# Patient Record
Sex: Male | Born: 1960 | Race: White | Hispanic: No | Smoking: Former smoker
Health system: Southern US, Community
[De-identification: ages and names within clinical notes are randomized; demographics above are authoritative.]

## PROBLEM LIST (undated history)

## (undated) DIAGNOSIS — E785 Hyperlipidemia, unspecified: Secondary | ICD-10-CM

## (undated) DIAGNOSIS — M1990 Unspecified osteoarthritis, unspecified site: Secondary | ICD-10-CM

## (undated) DIAGNOSIS — I10 Essential (primary) hypertension: Secondary | ICD-10-CM

## (undated) DIAGNOSIS — R7303 Prediabetes: Secondary | ICD-10-CM

## (undated) DIAGNOSIS — L309 Dermatitis, unspecified: Secondary | ICD-10-CM

## (undated) DIAGNOSIS — I872 Venous insufficiency (chronic) (peripheral): Secondary | ICD-10-CM

## (undated) HISTORY — PX: TYMPANOSTOMY TUBE PLACEMENT: SHX32

## (undated) HISTORY — PX: TONSILLECTOMY: SUR1361

## (undated) HISTORY — DX: Essential (primary) hypertension: I10

## (undated) HISTORY — DX: Prediabetes: R73.03

## (undated) HISTORY — DX: Unspecified osteoarthritis, unspecified site: M19.90

## (undated) HISTORY — PX: WISDOM TOOTH EXTRACTION: SHX21

## (undated) HISTORY — DX: Venous insufficiency (chronic) (peripheral): I87.2

## (undated) HISTORY — DX: Hyperlipidemia, unspecified: E78.5

## (undated) HISTORY — DX: Dermatitis, unspecified: L30.9

## (undated) HISTORY — PX: COLONOSCOPY: SHX174

---

## 2017-05-20 ENCOUNTER — Ambulatory Visit (INDEPENDENT_AMBULATORY_CARE_PROVIDER_SITE_OTHER): Payer: 59 | Admitting: Osteopathic Medicine

## 2017-05-20 ENCOUNTER — Encounter: Payer: Self-pay | Admitting: Osteopathic Medicine

## 2017-05-20 VITALS — BP 159/95 | HR 94 | Ht 74.5 in | Wt 307.0 lb

## 2017-05-20 DIAGNOSIS — Z860100 Personal history of colon polyps, unspecified: Secondary | ICD-10-CM

## 2017-05-20 DIAGNOSIS — Z23 Encounter for immunization: Secondary | ICD-10-CM

## 2017-05-20 DIAGNOSIS — Z1159 Encounter for screening for other viral diseases: Secondary | ICD-10-CM

## 2017-05-20 DIAGNOSIS — I10 Essential (primary) hypertension: Secondary | ICD-10-CM | POA: Diagnosis not present

## 2017-05-20 DIAGNOSIS — I872 Venous insufficiency (chronic) (peripheral): Secondary | ICD-10-CM

## 2017-05-20 DIAGNOSIS — Z6838 Body mass index (BMI) 38.0-38.9, adult: Secondary | ICD-10-CM | POA: Diagnosis not present

## 2017-05-20 DIAGNOSIS — L308 Other specified dermatitis: Secondary | ICD-10-CM | POA: Diagnosis not present

## 2017-05-20 LAB — COMPLETE METABOLIC PANEL WITH GFR
ALBUMIN: 4.2 g/dL (ref 3.6–5.1)
ALK PHOS: 80 U/L (ref 40–115)
ALT: 21 U/L (ref 9–46)
AST: 20 U/L (ref 10–35)
BUN: 12 mg/dL (ref 7–25)
CHLORIDE: 105 mmol/L (ref 98–110)
CO2: 24 mmol/L (ref 20–32)
CREATININE: 0.89 mg/dL (ref 0.70–1.33)
Calcium: 9.1 mg/dL (ref 8.6–10.3)
GFR, Est African American: >89 mL/min (ref >=60)
GFR, Est Non African American: >89 mL/min (ref >=60)
GLUCOSE: 127 mg/dL — AB (ref 65–99)
POTASSIUM: 3.8 mmol/L (ref 3.5–5.3)
SODIUM: 140 mmol/L (ref 135–146)
Total Bilirubin: 1 mg/dL (ref 0.2–1.2)
Total Protein: 6.8 g/dL (ref 6.1–8.1)

## 2017-05-20 LAB — LIPID PANEL
CHOL/HDL RATIO: 5.2 ratio — AB (ref <5.0)
Cholesterol: 193 mg/dL (ref <200)
HDL: 37 mg/dL — ABNORMAL LOW (ref >40)
LDL Cholesterol: 126 mg/dL — ABNORMAL HIGH (ref <100)
Triglycerides: 151 mg/dL — ABNORMAL HIGH (ref <150)
VLDL: 30 mg/dL (ref <30)

## 2017-05-20 LAB — CBC WITH DIFFERENTIAL/PLATELET
Basophils Absolute: 73 cells/uL (ref 0–200)
Basophils Relative: 1 %
EOS PCT: 2 %
Eosinophils Absolute: 146 cells/uL (ref 15–500)
HCT: 44.8 % (ref 38.5–50.0)
HEMOGLOBIN: 15.1 g/dL (ref 13.2–17.1)
LYMPHS ABS: 2044 {cells}/uL (ref 850–3900)
Lymphocytes Relative: 28 %
MCH: 31.1 pg (ref 27.0–33.0)
MCHC: 33.7 g/dL (ref 32.0–36.0)
MCV: 92.4 fL (ref 80.0–100.0)
MPV: 11.3 fL (ref 7.5–12.5)
Monocytes Absolute: 438 cells/uL (ref 200–950)
Monocytes Relative: 6 %
NEUTROS ABS: 4599 {cells}/uL (ref 1500–7800)
Neutrophils Relative %: 63 %
PLATELETS: 136 10*3/uL — AB (ref 140–400)
RBC: 4.85 MIL/uL (ref 4.20–5.80)
RDW: 13.4 % (ref 11.0–15.0)
WBC: 7.3 10*3/uL (ref 3.8–10.8)

## 2017-05-20 LAB — HEPATITIS C ANTIBODY: HCV Ab: NONREACTIVE

## 2017-05-20 MED ORDER — CLOBETASOL PROPIONATE 0.05 % EX OINT: 1.0000 "application " | TOPICAL_OINTMENT | Freq: Two times a day (BID) | CUTANEOUS | 3 refills | Status: DC

## 2017-05-20 NOTE — Patient Instructions (Addendum)
Plan: Eczema: steroid cream to small patches as needed, otherwise can continue Zyrtec daily with Cerave lotion.  Legs: compression stockings and walking are key! If getting worse, let me know.  Blood pressure: Whoa! We need to recheck this. I'm getting labs and as long as no issue on the blood work we should start a medicine for this.  Labs: screening for thyroid issues, diabetes, liver/kidney and other major organ functions, blood counts, vitamin D. Let's follow up soon to review lab work and recheck blood pressure

## 2017-05-20 NOTE — Progress Notes (Signed)
HPI: James Ware is a 56 y.o. male  who presents to [place] today, 05/20/17,  for chief complaint of:  Chief Complaint  Patient presents with  . Establish Care    Annual, referral for Colonoscopy, discuss problems with lower leg    Eczema/itching: Intermittent dry skin and itching, typically helped by Cerave lotion and when itching is bad Zyrtec is helpful.  Skin changes: Concern for petechiae on lower extremities. Has been getting a bit worse as he gets older. Occasional lower extremity edema which resolves with leg elevation. Compression socks were helpful but he is not consistent about wearing these.  Blood pressure: No history of hypertension but has been some time since he was seen by a doctor. Blood pressure elevated today, no chest pain pressure or shortness of breath. Positive family history of high blood pressure with mom     Past medical, surgical, social and family history reviewed: There are no active problems to display for this patient.  No past surgical history on file. Social History  Substance Use Topics  . Smoking status: Current Some Day Smoker  . Smokeless tobacco: Never Used  . Alcohol use Not on file   Family History  Problem Relation Age of Onset  . Hyperlipidemia Mother   . Cancer Father        bladder     Current medication list and allergy/intolerance information reviewed:   Current Outpatient Prescriptions  Medication Sig Dispense Refill  . Krill Oil 300 MG CAPS Take 300 mg by mouth daily.    . Multiple Vitamin (MULTIVITAMIN) tablet Take by mouth.     No current facility-administered medications for this visit.    No Known Allergies    Review of Systems:  Constitutional:  No  fever, no chills, No recent illness, No unintentional weight changes. No significant fatigue.   HEENT: No  headache, no vision change, no hearing change, No sore throat, No  sinus pressure  Cardiac: No  chest pain, No   pressure, No palpitations  Respiratory:  No  shortness of breath. No  Cough  Gastrointestinal: No  abdominal pain, No  nausea, No  vomiting,  No  blood in stool, No  diarrhea, No  constipation    Musculoskeletal: No new myalgia/arthralgia  Genitourinary: No  incontinence, No  abnormal genital bleeding, No abnormal genital discharge  Skin: +Rash, No other wounds/concerning lesions  Hem/Onc: No  easy bruising/bleeding  Endocrine: No cold intolerance,  No heat intolerance.  Neurologic: No  weakness, No  dizziness  Psychiatric: No  concerns with depression, No  concerns with anxiety, No sleep problems, No mood problems  Exam:  BP (!) 159/95   Pulse 94   Ht 6' 2.5" (1.892 m)   Wt (!) 307 lb (139.3 kg)   BMI 38.89 kg/m   Constitutional: VS see above. General Appearance: alert, well-developed, well-nourished, NAD  Eyes: Normal lids and conjunctive, non-icteric sclera  Ears, Nose, Mouth, Throat: MMM, Normal external inspection ears/nares/mouth/lips/gums.   Neck: No masses, trachea midline. No thyroid enlargement. No tenderness/mass appreciated. No lymphadenopathy  Respiratory: Normal respiratory effort. no wheeze, no rhonchi, no rales  Cardiovascular: S1/S2 normal, no murmur, no rub/gallop auscultated. RRR. Trace lower extremity edema at ankles - sock line, nonpitting. Pedal pulse II/IV bilaterally DP and PT.  Gastrointestinal: Obese  Musculoskeletal: Gait normal.   Neurological: Normal balance/coordination. No tremor.   Skin: warm, dry, intact. No significant petechiae but (+)enous stasis dermatitis mild without ulceration. Eczema patch on  right shoulder blade region. Mild excoriation and scaling without ulceration/drainage, no significant erythema   Psychiatric: Normal judgment/insight. Normal mood and affect. Oriented x3.     ASSESSMENT/PLAN:   Venous stasis dermatitis of both lower extremities - advised compression stockings, walking breaks at work, weight loss  -  Plan: TSH  Other eczema - Hypodensity steroid treatment at areas of rash/dermatitis, otherwise continue lotion and consider daily antihistamine - Plan: CBC with Differential/Platelet, clobetasol ointment (TEMOVATE) 0.05 %  Essential hypertension - If labs are okay, consider diuretics given mild swelling issues - Plan: COMPLETE METABOLIC PANEL WITH GFR, Lipid panel, TSH, Hemoglobin A1c, CBC with Differential/Platelet  History of colon polyps - Needs referral to previous GI, he will contact us with this information so we can place referral to whoever he saw before  BMI 38.0-38.9,adult - Plan: Hemoglobin A1c, VITAMIN D 25 Hydroxy (Vit-D Deficiency, Fractures)  Need for immunization against influenza - Plan: Flu Vaccine QUAD 36+ mos IM  Need for hepatitis C screening test - Plan: Hepatitis C antibody    Patient Instructions  Plan: Eczema: steroid cream to small patches as needed, otherwise can continue Zyrtec daily with Cerave lotion.  Legs: compression stockings and walking are key! If getting worse, let me know.  Blood pressure: Whoa! We need to recheck this. I'm getting labs and as long as no issue on the blood work we should start a medicine for this.  Labs: screening for thyroid issues, diabetes, liver/kidney and other major organ functions, blood counts, vitamin D. Let's follow up soon to review lab work and recheck blood pressure    Visit summary with medication list and pertinent instructions was printed for patient to review. All questions at time of visit were answered - patient instructed to contact office with any additional concerns. ER/RTC precautions were reviewed with the patient. Follow-up plan: No Follow-up on file.  Note: Total time spent 45 minutes, greater than 50% of the visit was spent face-to-face counseling and coordinating care for the following: The primary encounter diagnosis was Venous stasis dermatitis of both lower extremities. Diagnoses of Other eczema, Essential  hypertension, History of colon polyps, BMI 38.0-38.9,adult, Need for immunization against influenza, and Need for hepatitis C screening test were also pertinent to this visit.Marland Kitchen

## 2017-05-21 LAB — HEMOGLOBIN A1C
Hgb A1c MFr Bld: 6.4 % — ABNORMAL HIGH (ref <5.7)
MEAN PLASMA GLUCOSE: 137 mg/dL

## 2017-05-21 LAB — TSH: TSH: 2 m[IU]/L (ref 0.40–4.50)

## 2017-05-21 LAB — VITAMIN D 25 HYDROXY (VIT D DEFICIENCY, FRACTURES): Vit D, 25-Hydroxy: 18 ng/mL — ABNORMAL LOW (ref 30–100)

## 2017-05-24 ENCOUNTER — Telehealth: Payer: Self-pay | Admitting: Osteopathic Medicine

## 2017-05-24 ENCOUNTER — Encounter: Payer: Self-pay | Admitting: Osteopathic Medicine

## 2017-05-24 DIAGNOSIS — R7303 Prediabetes: Secondary | ICD-10-CM

## 2017-05-24 DIAGNOSIS — E1159 Type 2 diabetes mellitus with other circulatory complications: Secondary | ICD-10-CM | POA: Insufficient documentation

## 2017-05-24 DIAGNOSIS — L309 Dermatitis, unspecified: Secondary | ICD-10-CM | POA: Insufficient documentation

## 2017-05-24 DIAGNOSIS — I10 Essential (primary) hypertension: Secondary | ICD-10-CM

## 2017-05-24 DIAGNOSIS — I872 Venous insufficiency (chronic) (peripheral): Secondary | ICD-10-CM

## 2017-05-24 DIAGNOSIS — E1169 Type 2 diabetes mellitus with other specified complication: Secondary | ICD-10-CM | POA: Insufficient documentation

## 2017-05-24 HISTORY — DX: Essential (primary) hypertension: I10

## 2017-05-24 HISTORY — DX: Prediabetes: R73.03

## 2017-05-24 HISTORY — DX: Venous insufficiency (chronic) (peripheral): I87.2

## 2017-05-24 HISTORY — DX: Dermatitis, unspecified: L30.9

## 2017-05-24 MED ORDER — HYDROCHLOROTHIAZIDE 25 MG PO TABS: 25.0000 mg | ORAL_TABLET | Freq: Every day | ORAL | 2 refills | Status: DC

## 2017-05-24 NOTE — Telephone Encounter (Signed)
Please see below, a son colonoscopy request.  Please call patient: We can have him sign a records release when she is in the office next time for previous family medicine records. In the meantime, he should have gotten a call about his lab results. I went ahead and sent in a blood pressure medication for him to go ahead and start, I forgot to send it on Friday. My apologies! Any questions, let me know or we can discuss at upcoming visit

## 2017-05-24 NOTE — Telephone Encounter (Signed)
Pt sent this message through [facility] scheduling: Dr. Sheppard Coil -  In 2011 my colonoscopy was done at [facility]. They have organized now into a [place], [phone] - they said fax referral order to [phone] for Dr. Aviva Signs, the same physician who did the procedure 7 years (though it was eight,so not as tardy as I thought). They are covered by my Dentist.  BTW contact information for earlier doctors re medical records history - from early 1990s until 2012 went to Alonna Buckler at [facility] in [place], [place], [place]. Ph [phone].  From 2013-2016 saw Neva Seat at [facility], [place] , [place]. [place] Ph [phone]  Thanks for your words yesterday.  See you next Thursday!  Theodis Shove

## 2017-05-25 NOTE — Telephone Encounter (Signed)
Spoke to patient gave him advise as noted below. Tahmid Stonehocker,CMA  

## 2017-05-27 ENCOUNTER — Ambulatory Visit (INDEPENDENT_AMBULATORY_CARE_PROVIDER_SITE_OTHER): Payer: 59 | Admitting: Osteopathic Medicine

## 2017-05-27 ENCOUNTER — Encounter: Payer: Self-pay | Admitting: Osteopathic Medicine

## 2017-05-27 VITALS — BP 140/90 | HR 92 | Ht 75.5 in | Wt 305.0 lb

## 2017-05-27 DIAGNOSIS — E782 Mixed hyperlipidemia: Secondary | ICD-10-CM | POA: Insufficient documentation

## 2017-05-27 DIAGNOSIS — R7303 Prediabetes: Secondary | ICD-10-CM

## 2017-05-27 DIAGNOSIS — D696 Thrombocytopenia, unspecified: Secondary | ICD-10-CM | POA: Diagnosis not present

## 2017-05-27 DIAGNOSIS — I10 Essential (primary) hypertension: Secondary | ICD-10-CM

## 2017-05-27 DIAGNOSIS — E559 Vitamin D deficiency, unspecified: Secondary | ICD-10-CM | POA: Diagnosis not present

## 2017-05-27 DIAGNOSIS — E785 Hyperlipidemia, unspecified: Secondary | ICD-10-CM | POA: Insufficient documentation

## 2017-05-27 MED ORDER — VITAMIN D (ERGOCALCIFEROL) 1.25 MG (50000 UNIT) PO CAPS: 50000.0000 [IU] | ORAL_CAPSULE | ORAL | 0 refills | Status: DC

## 2017-05-27 NOTE — Patient Instructions (Addendum)
ASCVD risk - currently 10.5% for 10 year risk of heart attack and stroke, let's reevaluate in 3 months   In 3 months, let's recheck cholesterol, A1C (sugars), and vitamin D - and see how we are doing!   Blood pressure - nurse visit check in 2 weeks and labs that same day for rechecking kidney

## 2017-05-27 NOTE — Progress Notes (Signed)
HPI: James Ware is a 56 y.o. male  who presents to [place] today, 05/27/17,  for chief complaint of:  Chief Complaint  Patient presents with  . Follow-up    LABS    Seen last week to est care, we are following up today for review labs and recheck BP after starting medications.   Has been on HCT x2 days, no problems. No dizziness no CP.   Labs show: Cholesterol not too bad but ASCVD risk >7.5% Prediabetes, almost in DM2 range.  Vit D deficiency Healthy liver and kidney function Slightly low platelets   Past medical, surgical, social and family history reviewed: Patient Active Problem List   Diagnosis Date Noted  . Essential hypertension 05/24/2017  . Eczema 05/24/2017  . Venous stasis dermatitis of both lower extremities 05/24/2017  . Prediabetes 05/24/2017   No past surgical history on file. Social History  Substance Use Topics  . Smoking status: Current Some Day Smoker  . Smokeless tobacco: Never Used  . Alcohol use Not on file   Family History  Problem Relation Age of Onset  . Hyperlipidemia Mother   . Cancer Father        bladder     Current medication list and allergy/intolerance information reviewed:   Current Outpatient Prescriptions  Medication Sig Dispense Refill  . cetirizine (ZYRTEC) 10 MG tablet Take 10 mg by mouth daily.    . clobetasol ointment (TEMOVATE) 4.08 % Apply 1 application topically 2 (two) times daily. To affected area(s) as needed, max 2 weeks to avoid whitening/thinning skin 30 g 3  . hydrochlorothiazide (HYDRODIURIL) 25 MG tablet Take 1 tablet (25 mg total) by mouth daily. 30 tablet 2  . Krill Oil 300 MG CAPS Take 300 mg by mouth daily.    . Multiple Vitamin (MULTIVITAMIN) tablet Take by mouth.     No current facility-administered medications for this visit.    No Known Allergies    Review of Systems:  Constitutional: No recent illness, feels well today   HEENT: No  headache, no vision  change  Cardiac: No  chest pain, No  pressure, No palpitations  Respiratory:  No  shortness of breath. No  Cough  Neurologic: No  weakness, No  dizziness  Psychiatric: No  concerns with depression, No  concerns with anxiety  Exam:  BP 140/90   Pulse 92   Ht 6' 3.5" (1.918 m)   Wt (!) 305 lb (138.3 kg)   BMI 37.62 kg/m   Constitutional: VS see above. General Appearance: alert, well-developed, well-nourished, NAD  Eyes: Normal lids and conjunctive, non-icteric sclera  Ears, Nose, Mouth, Throat: MMM, Normal external inspection ears/nares/mouth/lips/gums.   Neck: No masses, trachea midline.  Respiratory: Normal respiratory effort. no wheeze, no rhonchi, no rales  Cardiovascular: S1/S2 normal, no murmur, no rub/gallop auscultated. RRR.   Musculoskeletal: Gait normal.  Neurological: Normal balance/coordination. No tremor.  Psychiatric: Normal judgment/insight. Normal mood and affect. Oriented x3.     ASSESSMENT/PLAN:   Essential hypertension - Plan: BASIC METABOLIC PANEL WITH GFR  Prediabetes - Plan: Hemoglobin A1c  Vitamin D deficiency - Plan: VITAMIN D 25 Hydroxy (Vit-D Deficiency, Fractures)  Mixed hyperlipidemia - Plan: Lipid panel  Decreased platelet count (HCC)    Patient Instructions  ASCVD risk - currently 10.5% for 10 year risk of heart attack and stroke, let's reevaluate in 3 months   In 3 months, let's recheck cholesterol, A1C (sugars), and vitamin D - and see how we are  doing!   Blood pressure - nurse visit check in 2 weeks and labs that same day for rechecking kidney     Visit summary with medication list and pertinent instructions was printed for patient to review. All questions at time of visit were answered - patient instructed to contact office with any additional concerns. ER/RTC precautions were reviewed with the patient. Follow-up plan: Return in about 3 months (around 08/27/2017) for nurse visit BP check 2 weeks, and check with Dr. Loni Muse w/  review labs in 3 months .  Note: Total time spent 25 minutes, greater than 50% of the visit was spent face-to-face counseling and coordinating care for the following: The primary encounter diagnosis was Essential hypertension. Diagnoses of Prediabetes, Vitamin D deficiency, Mixed hyperlipidemia, and Decreased platelet count ([place]) were also pertinent to this visit.Marland Kitchen

## 2017-06-10 ENCOUNTER — Ambulatory Visit (INDEPENDENT_AMBULATORY_CARE_PROVIDER_SITE_OTHER): Payer: 59 | Admitting: Osteopathic Medicine

## 2017-06-10 ENCOUNTER — Ambulatory Visit: Payer: 59

## 2017-06-10 ENCOUNTER — Other Ambulatory Visit: Payer: Self-pay

## 2017-06-10 VITALS — BP 137/76 | HR 82

## 2017-06-10 DIAGNOSIS — I10 Essential (primary) hypertension: Secondary | ICD-10-CM | POA: Diagnosis not present

## 2017-06-10 DIAGNOSIS — E782 Mixed hyperlipidemia: Secondary | ICD-10-CM

## 2017-06-10 DIAGNOSIS — Z1211 Encounter for screening for malignant neoplasm of colon: Secondary | ICD-10-CM

## 2017-06-10 DIAGNOSIS — R7303 Prediabetes: Secondary | ICD-10-CM

## 2017-06-10 DIAGNOSIS — E559 Vitamin D deficiency, unspecified: Secondary | ICD-10-CM

## 2017-06-10 MED ORDER — HYDROCHLOROTHIAZIDE 25 MG PO TABS: 25.0000 mg | ORAL_TABLET | Freq: Every day | ORAL | 1 refills | Status: DC

## 2017-06-10 NOTE — Progress Notes (Signed)
BP 137/76   Pulse 82  Continue current meds! Refills sent

## 2017-06-10 NOTE — Progress Notes (Signed)
Pt came into clinic today for BP check. Pt reports taking his medications daily, no negative side effects. Pt's BP was at goal today in office. Pt did state he is ready to get his colonoscopy completed, order placed. Pt also is going to schedule an eye exam, he will have them fax over report after completion. No further questions or concerns. Pt will get labs completed today.

## 2017-06-11 ENCOUNTER — Encounter: Payer: Self-pay | Admitting: Osteopathic Medicine

## 2017-06-11 LAB — LIPID PANEL W/REFLEX DIRECT LDL
CHOL/HDL RATIO: 5.3 (calc) — AB (ref <5.0)
Cholesterol: 175 mg/dL (ref <200)
HDL: 33 mg/dL — ABNORMAL LOW (ref >40)
LDL CHOLESTEROL (CALC): 117 mg/dL — AB
Non-HDL Cholesterol (Calc): 142 mg/dL (calc) — ABNORMAL HIGH (ref <130)
TRIGLYCERIDES: 141 mg/dL (ref <150)

## 2017-06-11 LAB — CBC WITH DIFFERENTIAL/PLATELET
Basophils Absolute: 50 cells/uL (ref 0–200)
Basophils Relative: 0.7 %
Eosinophils Absolute: 192 cells/uL (ref 15–500)
Eosinophils Relative: 2.7 %
HCT: 41.9 % (ref 38.5–50.0)
Hemoglobin: 14.4 g/dL (ref 13.2–17.1)
Lymphs Abs: 2215 cells/uL (ref 850–3900)
MCH: 31.2 pg (ref 27.0–33.0)
MCHC: 34.4 g/dL (ref 32.0–36.0)
MCV: 90.7 fL (ref 80.0–100.0)
MPV: 11.3 fL (ref 7.5–12.5)
Monocytes Relative: 8.5 %
NEUTROS PCT: 56.9 %
Neutro Abs: 4040 cells/uL (ref 1500–7800)
PLATELETS: 154 10*3/uL (ref 140–400)
RBC: 4.62 10*6/uL (ref 4.20–5.80)
RDW: 12.7 % (ref 11.0–15.0)
TOTAL LYMPHOCYTE: 31.2 %
WBC: 7.1 10*3/uL (ref 3.8–10.8)
WBCMIX: 604 {cells}/uL (ref 200–950)

## 2017-06-11 LAB — BASIC METABOLIC PANEL WITH GFR
BUN: 13 mg/dL (ref 7–25)
CHLORIDE: 103 mmol/L (ref 98–110)
CO2: 29 mmol/L (ref 20–32)
CREATININE: 0.98 mg/dL (ref 0.70–1.33)
Calcium: 9.2 mg/dL (ref 8.6–10.3)
GFR, Est African American: 98 mL/min/{1.73_m2} (ref >=60)
GFR, Est Non African American: 85 mL/min/{1.73_m2} (ref >=60)
GLUCOSE: 114 mg/dL — AB (ref 65–99)
Potassium: 3.9 mmol/L (ref 3.5–5.3)
SODIUM: 140 mmol/L (ref 135–146)

## 2017-06-11 LAB — HEMOGLOBIN A1C
Hgb A1c MFr Bld: 6.1 % of total Hgb — ABNORMAL HIGH (ref <5.7)
MEAN PLASMA GLUCOSE: 128 (calc)
eAG (mmol/L): 7.1 (calc)

## 2017-06-11 LAB — VITAMIN D 25 HYDROXY (VIT D DEFICIENCY, FRACTURES): Vit D, 25-Hydroxy: 31 ng/mL (ref 30–100)

## 2017-07-30 LAB — HM COLONOSCOPY

## 2017-08-26 ENCOUNTER — Ambulatory Visit: Payer: 59 | Admitting: Osteopathic Medicine

## 2017-08-28 LAB — LIPID PANEL
CHOL/HDL RATIO: 4.1 (calc) (ref <5.0)
Cholesterol: 111 mg/dL (ref <200)
HDL: 27 mg/dL — AB (ref >40)
LDL Cholesterol (Calc): 66 mg/dL (calc)
NON-HDL CHOLESTEROL (CALC): 84 mg/dL (ref <130)
Triglycerides: 98 mg/dL (ref <150)

## 2017-08-28 LAB — VITAMIN D 25 HYDROXY (VIT D DEFICIENCY, FRACTURES): Vit D, 25-Hydroxy: 49 ng/mL (ref 30–100)

## 2017-08-28 LAB — HEMOGLOBIN A1C
EAG (MMOL/L): 7.6 (calc)
HEMOGLOBIN A1C: 6.4 %{Hb} — AB (ref <5.7)
MEAN PLASMA GLUCOSE: 137 (calc)

## 2017-09-02 ENCOUNTER — Encounter: Payer: Self-pay | Admitting: Osteopathic Medicine

## 2017-09-02 ENCOUNTER — Ambulatory Visit (INDEPENDENT_AMBULATORY_CARE_PROVIDER_SITE_OTHER): Payer: 59 | Admitting: Osteopathic Medicine

## 2017-09-02 VITALS — BP 118/79 | HR 84 | Wt 298.0 lb

## 2017-09-02 DIAGNOSIS — I10 Essential (primary) hypertension: Secondary | ICD-10-CM

## 2017-09-02 DIAGNOSIS — E559 Vitamin D deficiency, unspecified: Secondary | ICD-10-CM

## 2017-09-02 DIAGNOSIS — E782 Mixed hyperlipidemia: Secondary | ICD-10-CM | POA: Diagnosis not present

## 2017-09-02 DIAGNOSIS — R7303 Prediabetes: Secondary | ICD-10-CM | POA: Diagnosis not present

## 2017-09-02 LAB — POCT URINALYSIS DIPSTICK
BILIRUBIN UA: NEGATIVE
Blood, UA: NEGATIVE
Glucose, UA: NEGATIVE
KETONES UA: NEGATIVE
Leukocytes, UA: NEGATIVE
Nitrite, UA: NEGATIVE
Protein, UA: NEGATIVE
Spec Grav, UA: >=1.03 — AB (ref 1.010–1.025)
Urobilinogen, UA: 0.2 E.U./dL
pH, UA: 5.5 (ref 5.0–8.0)

## 2017-09-02 MED ORDER — TADALAFIL 10 MG PO TABS: 10.0000 mg | ORAL_TABLET | Freq: Every day | ORAL | 1 refills | Status: DC | PRN

## 2017-09-02 NOTE — Progress Notes (Signed)
HPI: James Ware is a 56 y.o. male who  has a past medical history of Eczema (05/24/2017), Essential hypertension (05/24/2017), Prediabetes (05/24/2017), and Venous stasis dermatitis of both lower extremities (05/24/2017).  he presents to [facility] today, 09/02/17,  for chief complaint of:  Chief Complaint  Patient presents with  . Follow-up    Labs    Hyperlipidemia: LDL trending down, HDL is still a bit on the low side. He has made significant adjustments to diet, some to exercise.  Prediabetes: See above, lifestyle changes have not made much difference in terms of A1c but initially he is still in the prediabetic range. Not interested in starting medications at this time.  Vitamin D deficiency: Up into normal range, has completed course of high-dose supplementation.  Hypertension: No chest pain, pressure, shortness of breath. No home blood pressures to report.    Past medical, surgical, social and family history reviewed:  Patient Active Problem List   Diagnosis Date Noted  . Mixed hyperlipidemia 05/27/2017  . Decreased platelet count ([place]) 05/27/2017  . Vitamin D deficiency 05/27/2017  . Essential hypertension 05/24/2017  . Eczema 05/24/2017  . Venous stasis dermatitis of both lower extremities 05/24/2017  . Prediabetes 05/24/2017      Social History   Tobacco Use  . Smoking status: Current Some Day Smoker  . Smokeless tobacco: Never Used  Substance Use Topics  . Alcohol use: Not on file    Family History  Problem Relation Age of Onset  . Hyperlipidemia Mother   . Cancer Father        bladder     Current medication list and allergy/intolerance information reviewed:    Current Outpatient Medications  Medication Sig Dispense Refill  . cetirizine (ZYRTEC) 10 MG tablet Take 10 mg by mouth daily.    . clobetasol ointment (TEMOVATE) 5.63 % Apply 1 application topically 2 (two) times daily. To affected area(s) as needed, max 2  weeks to avoid whitening/thinning skin 30 g 3  . hydrochlorothiazide (HYDRODIURIL) 25 MG tablet Take 1 tablet (25 mg total) by mouth daily. 90 tablet 1  . Krill Oil 300 MG CAPS Take 300 mg by mouth daily.    . Multiple Vitamin (MULTIVITAMIN) tablet Take by mouth.    . Vitamin D, Ergocalciferol, (DRISDOL) 50000 units CAPS capsule Take 1 capsule (50,000 Units total) by mouth every 7 (seven) days. Take for 12 total doses(weeks) 12 capsule 0   No current facility-administered medications for this visit.     No Known Allergies    Review of Systems:  Constitutional:  No  fever, no chills, No recent illness,  HEENT: No  headache, no vision change  Cardiac: No  chest pain, No  pressure  Respiratory:  No  shortness of breath. No  Cough  Gastrointestinal: No  abdominal pain, No  nausea  Musculoskeletal: No new myalgia/arthralgia  Skin: No  Rash  Psychiatric: No  concerns with depression, No  concerns with anxiety  Exam:  BP 118/79   Pulse 84   Wt 298 lb (135.2 kg)   SpO2 99%   BMI 36.76 kg/m   Constitutional: VS see above. General Appearance: alert, well-developed, well-nourished, NAD  Eyes: Normal lids and conjunctive, non-icteric sclera  Ears, Nose, Mouth, Throat: MMM, Normal external inspection ears/nares/mouth/lips/gums.   Neck: No masses, trachea midline.   Respiratory: Normal respiratory effort. no wheeze, no rhonchi, no rales  Cardiovascular: S1/S2 normal, no murmur, no rub/gallop auscultated. RRR.  Musculoskeletal: Gait normal.  Neurological: Normal balance/coordination. No tremor.  Skin: warm, dry, intact. No rash/ulcer.   Psychiatric: Normal judgment/insight. Normal mood and affect. Oriented x3.   Results for orders placed or performed in visit on 09/02/17 (from the past 24 hour(s))  POCT Urinalysis Dipstick     Status: Abnormal   Collection Time: 09/02/17  7:59 AM  Result Value Ref Range   Color, UA yellow    Clarity, UA clear    Glucose, UA negative     Bilirubin, UA negative    Ketones, UA negative    Spec Grav, UA >=1.030 (A) 1.010 - 1.025   Blood, UA negative    pH, UA 5.5 5.0 - 8.0   Protein, UA negative    Urobilinogen, UA 0.2 0.2 or 1.0 E.U./dL   Nitrite, UA negative    Leukocytes, UA Negative Negative      ASSESSMENT/PLAN:   Essential hypertension - Requests urinalysis, no complaints. Blood pressure better on recheck - Plan: POCT Urinalysis Dipstick  Prediabetes - Continue diet/exercise modifications, would strongly consider metformin  Vitamin D deficiency - Over-the-counter supplementation with 1000 - 2000 units daily  Mixed hyperlipidemia - Improved, HDL still a bit low. We'll monitor     Visit summary with medication list and pertinent instructions was printed for patient to review. All questions at time of visit were answered - patient instructed to contact office with any additional concerns. ER/RTC precautions were reviewed with the patient. Follow-up plan: Return in about 3 months (around 12/01/2017) for recheck prediabetes, sooner if needed.  Note: Total time spent 25 minutes, greater than 50% of the visit was spent face-to-face counseling and coordinating care for the following: The primary encounter diagnosis was Essential hypertension. Diagnoses of Prediabetes, Vitamin D deficiency, and Mixed hyperlipidemia were also pertinent to this visit.Marland Kitchen  Please note: voice recognition software was used to produce this document, and typos may escape review. Please contact Dr. Sheppard Coil for any needed clarifications.

## 2017-09-03 ENCOUNTER — Telehealth: Payer: Self-pay | Admitting: Osteopathic Medicine

## 2017-09-03 MED ORDER — SILDENAFIL CITRATE 50 MG PO TABS: 50.0000 mg | ORAL_TABLET | Freq: Every day | ORAL | 0 refills | Status: DC | PRN

## 2017-09-03 NOTE — Telephone Encounter (Signed)
Please call patient: I got a notification from [facility] that Cialis was not covered by his plan, only Viagra is. I sent in the Viagra. We'll canceled the Cialis. Any questions, concerns, let us know

## 2017-09-07 NOTE — Telephone Encounter (Signed)
Left message advising of new medication was sent to the pharmacy. I didn't leave the name of the medication. Also, I advised him to call back if he has any question.

## 2017-09-07 NOTE — Telephone Encounter (Signed)
Got notification that Viagra was also denied, will fax back asking what if any PDE5 inhibitors are covered.

## 2017-09-09 NOTE — Telephone Encounter (Signed)
Left VM for Pt to return clinic call.  

## 2017-09-11 ENCOUNTER — Other Ambulatory Visit: Payer: Self-pay | Admitting: Osteopathic Medicine

## 2017-09-14 MED ORDER — SILDENAFIL CITRATE 20 MG PO TABS: 20.0000 mg | ORAL_TABLET | ORAL | 6 refills | Status: DC | PRN

## 2017-09-14 NOTE — Telephone Encounter (Signed)
Patient called requesting an update. Routing to pcp for review.

## 2017-09-14 NOTE — Telephone Encounter (Signed)
LM for pt to return call. KG LPN

## 2017-09-14 NOTE — Telephone Encounter (Signed)
Can let patient know that the Cialis and Viagra were both denied by insurance.  I asked the pharmacy to send me a list of what insurance might cover for him, but I have not heard anything back yet.  If he is willing to pay out of pocket, typically the cheapest is Viagra through [facility] which does deliver to the home, this is a pharmacy in [place].  I will go ahead and send a prescription over there and someone from that pharmacy should contact him.

## 2017-09-14 NOTE — Addendum Note (Signed)
Addended by: Maryla Morrow on: 09/14/2017 12:28 PM   Modules accepted: Orders

## 2017-09-15 NOTE — Telephone Encounter (Signed)
Patient has been updated. Pick up Revatio prescription from [facility] today.

## 2017-12-02 ENCOUNTER — Ambulatory Visit: Payer: 59 | Admitting: Osteopathic Medicine

## 2017-12-13 ENCOUNTER — Encounter: Payer: Self-pay | Admitting: Osteopathic Medicine

## 2017-12-16 ENCOUNTER — Ambulatory Visit (INDEPENDENT_AMBULATORY_CARE_PROVIDER_SITE_OTHER): Payer: 59 | Admitting: Osteopathic Medicine

## 2017-12-16 ENCOUNTER — Encounter: Payer: Self-pay | Admitting: Osteopathic Medicine

## 2017-12-16 VITALS — BP 138/83 | HR 85 | Temp 97.7°F | Wt 300.1 lb

## 2017-12-16 DIAGNOSIS — R7303 Prediabetes: Secondary | ICD-10-CM | POA: Diagnosis not present

## 2017-12-16 DIAGNOSIS — H9193 Unspecified hearing loss, bilateral: Secondary | ICD-10-CM

## 2017-12-16 DIAGNOSIS — I10 Essential (primary) hypertension: Secondary | ICD-10-CM | POA: Diagnosis not present

## 2017-12-16 DIAGNOSIS — H9190 Unspecified hearing loss, unspecified ear: Secondary | ICD-10-CM | POA: Insufficient documentation

## 2017-12-16 LAB — POCT GLYCOSYLATED HEMOGLOBIN (HGB A1C): HEMOGLOBIN A1C: 7.4

## 2017-12-16 NOTE — Progress Notes (Signed)
HPI: James Ware is a 57 y.o. male who  has a past medical history of Eczema (05/24/2017), Essential hypertension (05/24/2017), Prediabetes (05/24/2017), and Venous stasis dermatitis of both lower extremities (05/24/2017).  he presents to [facility] today, 12/16/17,  for chief complaint of:  Sugar follow-up   Prediabetes: 08/27/17 A1C was 6.4% Today 12/16/17: 7.4% Admits he could be better about diet/exercise. Still reluctant to start medication treatment at this time.  Hypertension: Blood pressure under pre-good control today. No chest pain, pressure, shortness of breath.  Hearing loss: Patient will be getting hearing aids, following with audiology/ENT.    Past medical history, surgical history, social history and family history reviewed.  Current medication list and allergy/intolerance information reviewed.    Current Outpatient Medications on File Prior to Visit  Medication Sig Dispense Refill  . cetirizine (ZYRTEC) 10 MG tablet Take 10 mg by mouth daily.    . clobetasol ointment (TEMOVATE) 0.27 % Apply 1 application topically 2 (two) times daily. To affected area(s) as needed, max 2 weeks to avoid whitening/thinning skin 30 g 3  . hydrochlorothiazide (HYDRODIURIL) 25 MG tablet Take 1 tablet (25 mg total) by mouth daily. 90 tablet 1  . Krill Oil 300 MG CAPS Take 300 mg by mouth daily.    . Multiple Vitamin (MULTIVITAMIN) tablet Take by mouth.    . sildenafil (REVATIO) 20 MG tablet Take 1-5 tablets (20-100 mg total) by mouth as needed (prior to sex). 50 tablet 6   No current facility-administered medications on file prior to visit.    No Known Allergies    Review of Systems:  Constitutional: No recent illness  HEENT: No  headache, no vision change  Cardiac: No  chest pain, No  pressure, No palpitations  Respiratory:  No  shortness of breath. No  Cough  Gastrointestinal: No  abdominal pain, no change on bowel  habits  Musculoskeletal: No new myalgia/arthralgia  Skin: No  Rash  Hem/Onc: No  easy bruising/bleeding, No  abnormal lumps/bumps  Neurologic: No  weakness, No  Dizziness  Psychiatric: No  concerns with depression, No  concerns with anxiety  Exam:  BP 138/83 (BP Location: Left Arm)   Pulse 85   Temp 97.7 F (36.5 C) (Oral)   Wt (!) 300 lb 1.9 oz (136.1 kg)   BMI 37.02 kg/m   Constitutional: VS see above. General Appearance: alert, well-developed, well-nourished, NAD  Eyes: Normal lids and conjunctive, non-icteric sclera  Ears, Nose, Mouth, Throat: MMM, Normal external inspection ears/nares/mouth/lips/gums.  Neck: No masses, trachea midline.   Respiratory: Normal respiratory effort. no wheeze, no rhonchi, no rales  Cardiovascular: S1/S2 normal, no murmur, no rub/gallop auscultated. RRR.   Musculoskeletal: Gait normal. Symmetric and independent movement of all extremities  Neurological: Normal balance/coordination. No tremor.  Skin: warm, dry, intact.   Psychiatric: Normal judgment/insight. Normal mood and affect. Oriented x3.     ASSESSMENT/PLAN:   Prediabetes - A1c into the diabetic range, patient would like to give it 3 more months lifestyle changes before metformin considered.Okay for now - Plan: POCT HgB A1C  Bilateral hearing loss, unspecified hearing loss type - normal workup per patient, negative for Mnire's disease, syphilis, etc.  Essential hypertension        Follow-up plan: Return in about 3 months (around 03/18/2018) for recheck A1C .  Visit summary with medication list and pertinent instructions was printed for patient to review, alert [place] if any changes needed. All questions at time of visit were answered -  patient instructed to contact office with any additional concerns. ER/RTC precautions were reviewed with the patient and understanding verbalized.   Note: Total time spent 25 minutes, greater than 50% of the visit was spent face-to-face  counseling and coordinating care for the following: The primary encounter diagnosis was Prediabetes. Diagnoses of Bilateral hearing loss, unspecified hearing loss type and Essential hypertension were also pertinent to this visit.Marland Kitchen  Please note: voice recognition software was used to produce this document, and typos may escape review. Please contact Dr. Sheppard Coil for any needed clarifications.

## 2017-12-16 NOTE — Patient Instructions (Signed)
Diabetes Mellitus and Nutrition When you have diabetes (diabetes mellitus), it is very important to have healthy eating habits because your blood sugar (glucose) levels are greatly affected by what you eat and drink. Eating healthy foods in the appropriate amounts, at about the same times every day, can help you:  Control your blood glucose.  Lower your risk of heart disease.  Improve your blood pressure.  Reach or maintain a healthy weight.  Every person with diabetes is different, and each person has different needs for a meal plan. Your health care provider may recommend that you work with a diet and nutrition specialist (dietitian) to make a meal plan that is best for you. Your meal plan may vary depending on factors such as:  The calories you need.  The medicines you take.  Your weight.  Your blood glucose, blood pressure, and cholesterol levels.  Your activity level.  Other health conditions you have, such as heart or kidney disease.  How do carbohydrates affect me? Carbohydrates affect your blood glucose level more than any other type of food. Eating carbohydrates naturally increases the amount of glucose in your blood. Carbohydrate counting is a method for keeping track of how many carbohydrates you eat. Counting carbohydrates is important to keep your blood glucose at a healthy level, especially if you use insulin or take certain oral diabetes medicines. It is important to know how many carbohydrates you can safely have in each meal. This is different for every person. Your dietitian can help you calculate how many carbohydrates you should have at each meal and for snack. Foods that contain carbohydrates include:  Bread, cereal, rice, pasta, and crackers.  Potatoes and corn.  Peas, beans, and lentils.  Milk and yogurt.  Fruit and juice.  Desserts, such as cakes, cookies, ice cream, and candy.  How does alcohol affect me? Alcohol can cause a sudden decrease in blood  glucose (hypoglycemia), especially if you use insulin or take certain oral diabetes medicines. Hypoglycemia can be a life-threatening condition. Symptoms of hypoglycemia (sleepiness, dizziness, and confusion) are similar to symptoms of having too much alcohol. If your health care provider says that alcohol is safe for you, follow these guidelines:  Limit alcohol intake to no more than 1 drink per day for nonpregnant women and 2 drinks per day for men. One drink equals 12 oz of beer, 5 oz of wine, or 1 oz of hard liquor.  Do not drink on an empty stomach.  Keep yourself hydrated with water, diet soda, or unsweetened iced tea.  Keep in mind that regular soda, juice, and other mixers may contain a lot of sugar and must be counted as carbohydrates.  What are tips for following this plan? Reading food labels  Start by checking the serving size on the label. The amount of calories, carbohydrates, fats, and other nutrients listed on the label are based on one serving of the food. Many foods contain more than one serving per package.  Check the total grams (g) of carbohydrates in one serving. You can calculate the number of servings of carbohydrates in one serving by dividing the total carbohydrates by 15. For example, if a food has 30 g of total carbohydrates, it would be equal to 2 servings of carbohydrates.  Check the number of grams (g) of saturated and trans fats in one serving. Choose foods that have low or no amount of these fats.  Check the number of milligrams (mg) of sodium in one serving. Most people   should limit total sodium intake to less than 2,300 mg per day.  Always check the nutrition information of foods labeled as "low-fat" or "nonfat". These foods may be higher in added sugar or refined carbohydrates and should be avoided.  Talk to your dietitian to identify your daily goals for nutrients listed on the label. Shopping  Avoid buying canned, premade, or processed foods. These  foods tend to be high in fat, sodium, and added sugar.  Shop around the outside edge of the grocery store. This includes fresh fruits and vegetables, bulk grains, fresh meats, and fresh dairy. Cooking  Use low-heat cooking methods, such as baking, instead of high-heat cooking methods like deep frying.  Cook using healthy oils, such as olive, canola, or sunflower oil.  Avoid cooking with butter, cream, or high-fat meats. Meal planning  Eat meals and snacks regularly, preferably at the same times every day. Avoid going long periods of time without eating.  Eat foods high in fiber, such as fresh fruits, vegetables, beans, and whole grains. Talk to your dietitian about how many servings of carbohydrates you can eat at each meal.  Eat 4-6 ounces of lean protein each day, such as lean meat, chicken, fish, eggs, or tofu. 1 ounce is equal to 1 ounce of meat, chicken, or fish, 1 egg, or 1/4 cup of tofu.  Eat some foods each day that contain healthy fats, such as avocado, nuts, seeds, and fish. Lifestyle   Check your blood glucose regularly.  Exercise at least 30 minutes 5 or more days each week, or as told by your health care provider.  Take medicines as told by your health care provider.  Do not use any products that contain nicotine or tobacco, such as cigarettes and e-cigarettes. If you need help quitting, ask your health care provider.  Work with a counselor or diabetes educator to identify strategies to manage stress and any emotional and social challenges. What are some questions to ask my health care provider?  Do I need to meet with a diabetes educator?  Do I need to meet with a dietitian?  What number can I call if I have questions?  When are the best times to check my blood glucose? Where to find more information:  American Diabetes Association: diabetes.org/food-and-fitness/food  Academy of Nutrition and Dietetics:  www.eatright.org/resources/health/diseases-and-conditions/diabetes  National Institute of Diabetes and Digestive and Kidney Diseases (NIH): www.niddk.nih.gov/health-information/diabetes/overview/diet-eating-physical-activity Summary  A healthy meal plan will help you control your blood glucose and maintain a healthy lifestyle.  Working with a diet and nutrition specialist (dietitian) can help you make a meal plan that is best for you.  Keep in mind that carbohydrates and alcohol have immediate effects on your blood glucose levels. It is important to count carbohydrates and to use alcohol carefully. This information is not intended to replace advice given to you by your health care provider. Make sure you discuss any questions you have with your health care provider. Document Released: 06/11/2005 Document Revised: 10/19/2016 Document Reviewed: 10/19/2016 Elsevier Interactive Patient Education  2018 Elsevier Inc.  

## 2017-12-18 ENCOUNTER — Other Ambulatory Visit: Payer: Self-pay | Admitting: Osteopathic Medicine

## 2017-12-18 DIAGNOSIS — I10 Essential (primary) hypertension: Secondary | ICD-10-CM

## 2018-01-31 ENCOUNTER — Encounter: Payer: Self-pay | Admitting: Osteopathic Medicine

## 2018-03-17 ENCOUNTER — Ambulatory Visit: Payer: 59 | Admitting: Osteopathic Medicine

## 2018-03-24 ENCOUNTER — Encounter: Payer: Self-pay | Admitting: Osteopathic Medicine

## 2018-03-24 ENCOUNTER — Ambulatory Visit (INDEPENDENT_AMBULATORY_CARE_PROVIDER_SITE_OTHER): Payer: 59 | Admitting: Osteopathic Medicine

## 2018-03-24 VITALS — BP 133/71 | HR 82 | Temp 98.2°F | Wt 286.5 lb

## 2018-03-24 DIAGNOSIS — I10 Essential (primary) hypertension: Secondary | ICD-10-CM

## 2018-03-24 DIAGNOSIS — Z0000 Encounter for general adult medical examination without abnormal findings: Secondary | ICD-10-CM

## 2018-03-24 DIAGNOSIS — R7303 Prediabetes: Secondary | ICD-10-CM

## 2018-03-24 DIAGNOSIS — E782 Mixed hyperlipidemia: Secondary | ICD-10-CM | POA: Diagnosis not present

## 2018-03-24 LAB — POCT GLYCOSYLATED HEMOGLOBIN (HGB A1C): Hemoglobin A1C: 6 % — AB (ref 4.0–5.6)

## 2018-03-24 NOTE — Progress Notes (Signed)
HPI: James Ware is a 57 y.o. male who  has a past medical history of Eczema (05/24/2017), Essential hypertension (05/24/2017), Prediabetes (05/24/2017), and Venous stasis dermatitis of both lower extremities (05/24/2017).  he presents to [facility] today, 03/24/18,  for chief complaint of:  Sugar follow-up   Prediabetes: 08/27/17 A1C 6.4% 12/16/17: 7.4% Admitted at last visit he could be better about diet/exercise. Still reluctant to start medication treatment - we opted to give it 3 more months and see how A1C was looking.  Today 03/24/18: 6.0% has lost weight! Counting carbs, graphing his progress, he's found a system that works well for him.   Hypertension: Blood pressure under pretty good control today. No chest pain, pressure, shortness of breath.  Hyperlipidemia: Last lipids looked good except low HDL.      Past medical history, surgical history, social history and family history reviewed.  Current medication list and allergy/intolerance information reviewed.    Current Outpatient Medications on File Prior to Visit  Medication Sig Dispense Refill  . cetirizine (ZYRTEC) 10 MG tablet Take 10 mg by mouth daily.    . clobetasol ointment (TEMOVATE) 6.64 % Apply 1 application topically 2 (two) times daily. To affected area(s) as needed, max 2 weeks to avoid whitening/thinning skin 30 g 3  . hydrochlorothiazide (HYDRODIURIL) 25 MG tablet TAKE 1 TABLET BY MOUTH EVERY DAY 90 tablet 1  . Krill Oil 300 MG CAPS Take 300 mg by mouth daily.    . Multiple Vitamin (MULTIVITAMIN) tablet Take by mouth.    . Probiotic Product (PROBIOTIC ACIDOPHILUS BEADS) CAPS Take by mouth.    . sildenafil (REVATIO) 20 MG tablet Take 1-5 tablets (20-100 mg total) by mouth as needed (prior to sex). 50 tablet 6   No current facility-administered medications on file prior to visit.    No Known Allergies    Review of Systems:  Constitutional: No recent illness  HEENT:  No  headache, no vision change  Cardiac: No  chest pain, No  pressure, No palpitations  Respiratory:  No  shortness of breath. No  Cough  Neurologic: No  weakness, No  Dizziness   Exam:  BP 133/71 (BP Location: Left Arm, Patient Position: Sitting, Cuff Size: Large)   Pulse 82   Temp 98.2 F (36.8 C) (Oral)   Wt 286 lb 8 oz (130 kg)   BMI 35.34 kg/m   Constitutional: VS see above. General Appearance: alert, well-developed, well-nourished, NAD  Eyes: Normal lids and conjunctive, non-icteric sclera  Ears, Nose, Mouth, Throat: MMM, Normal external inspection ears/nares/mouth/lips/gums.  Neck: No masses, trachea midline.   Respiratory: Normal respiratory effort. no wheeze, no rhonchi, no rales  Cardiovascular: S1/S2 normal, no murmur, no rub/gallop auscultated. RRR.   Musculoskeletal: Gait normal. Symmetric and independent movement of all extremities  Neurological: Normal balance/coordination. No tremor.  Skin: warm, dry, intact.   Psychiatric: Normal judgment/insight. Normal mood and affect. Oriented x3.   Results for orders placed or performed in visit on 03/24/18 (from the past 24 hour(s))  POCT HgB A1C     Status: Abnormal   Collection Time: 03/24/18  9:21 AM  Result Value Ref Range   Hemoglobin A1C 6.0 (A) 4.0 - 5.6 %   HbA1c POC (<> result, manual entry)  4.0 - 5.6 %   HbA1c, POC (prediabetic range)  5.7 - 6.4 %   HbA1c, POC (controlled diabetic range)  0.0 - 7.0 %     ASSESSMENT/PLAN:   Prediabetes - Plan: POCT  HgB A1C, TSH, Hemoglobin A1c  Essential hypertension - Plan: CBC, COMPLETE METABOLIC PANEL WITH GFR, Lipid panel  Mixed hyperlipidemia - Plan: CBC, COMPLETE METABOLIC PANEL WITH GFR, Lipid panel, TSH  Annual physical exam - labs ordered for future visit  - Plan: CBC, COMPLETE METABOLIC PANEL WITH GFR, Lipid panel, TSH, VITAMIN D 25 Hydroxy (Vit-D Deficiency, Fractures), Hemoglobin A1c        Follow-up plan: Return in about 3 months (around  06/24/2018) for ANNUAL PHYSICAL and lab review .  Visit summary with medication list and pertinent instructions was printed for patient to review, alert [place] if any changes needed. All questions at time of visit were answered - patient instructed to contact office with any additional concerns. ER/RTC precautions were reviewed with the patient and understanding verbalized.   Note: Total time spent 25 minutes, greater than 50% of the visit was spent face-to-face counseling and coordinating care for the following: The primary encounter diagnosis was Prediabetes. Diagnoses of Essential hypertension, Mixed hyperlipidemia were also pertinent to this visit.Marland Kitchen  Please note: voice recognition software was used to produce this document, and typos may escape review. Please contact Dr. Sheppard Coil for any needed clarifications.

## 2018-06-13 ENCOUNTER — Other Ambulatory Visit: Payer: Self-pay | Admitting: Osteopathic Medicine

## 2018-06-13 DIAGNOSIS — I10 Essential (primary) hypertension: Secondary | ICD-10-CM

## 2018-06-24 ENCOUNTER — Encounter: Payer: 59 | Admitting: Osteopathic Medicine

## 2018-07-01 ENCOUNTER — Telehealth: Payer: Self-pay

## 2018-07-01 DIAGNOSIS — E782 Mixed hyperlipidemia: Secondary | ICD-10-CM

## 2018-07-01 DIAGNOSIS — R7303 Prediabetes: Secondary | ICD-10-CM

## 2018-07-01 DIAGNOSIS — I10 Essential (primary) hypertension: Secondary | ICD-10-CM

## 2018-07-01 DIAGNOSIS — Z0000 Encounter for general adult medical examination without abnormal findings: Secondary | ICD-10-CM

## 2018-07-01 DIAGNOSIS — E559 Vitamin D deficiency, unspecified: Secondary | ICD-10-CM

## 2018-07-01 NOTE — Telephone Encounter (Signed)
Pt called requesting lab order for CPE. Pt has an upcoming appt scheduled on 07/15/18 & would like to complete fasting labs before his visit. Thanks.

## 2018-07-04 NOTE — Telephone Encounter (Signed)
Pt advised.

## 2018-07-04 NOTE — Telephone Encounter (Signed)
Orders are in, he can go to the lab fasting at his convenience

## 2018-07-12 LAB — CBC
HCT: 43.3 % (ref 38.5–50.0)
HEMOGLOBIN: 15.2 g/dL (ref 13.2–17.1)
MCH: 31.3 pg (ref 27.0–33.0)
MCHC: 35.1 g/dL (ref 32.0–36.0)
MCV: 89.1 fL (ref 80.0–100.0)
MPV: 11.3 fL (ref 7.5–12.5)
Platelets: 158 10*3/uL (ref 140–400)
RBC: 4.86 10*6/uL (ref 4.20–5.80)
RDW: 12.5 % (ref 11.0–15.0)
WBC: 8.9 10*3/uL (ref 3.8–10.8)

## 2018-07-12 LAB — COMPLETE METABOLIC PANEL WITH GFR
AG Ratio: 1.5 (calc) (ref 1.0–2.5)
ALBUMIN MSPROF: 4.2 g/dL (ref 3.6–5.1)
ALT: 19 U/L (ref 9–46)
AST: 20 U/L (ref 10–35)
Alkaline phosphatase (APISO): 66 U/L (ref 40–115)
BILIRUBIN TOTAL: 1.2 mg/dL (ref 0.2–1.2)
BUN: 16 mg/dL (ref 7–25)
CALCIUM: 9.6 mg/dL (ref 8.6–10.3)
CHLORIDE: 100 mmol/L (ref 98–110)
CO2: 30 mmol/L (ref 20–32)
Creat: 0.89 mg/dL (ref 0.70–1.33)
GFR, EST AFRICAN AMERICAN: 108 mL/min/{1.73_m2} (ref >=60)
GFR, EST NON AFRICAN AMERICAN: 94 mL/min/{1.73_m2} (ref >=60)
GLUCOSE: 100 mg/dL — AB (ref 65–99)
Globulin: 2.8 g/dL (calc) (ref 1.9–3.7)
Potassium: 3.9 mmol/L (ref 3.5–5.3)
Sodium: 138 mmol/L (ref 135–146)
TOTAL PROTEIN: 7 g/dL (ref 6.1–8.1)

## 2018-07-12 LAB — TSH: TSH: 2.73 mIU/L (ref 0.40–4.50)

## 2018-07-12 LAB — LIPID PANEL
Cholesterol: 187 mg/dL (ref <200)
HDL: 31 mg/dL — ABNORMAL LOW (ref >40)
LDL CHOLESTEROL (CALC): 126 mg/dL — AB
NON-HDL CHOLESTEROL (CALC): 156 mg/dL — AB (ref <130)
Total CHOL/HDL Ratio: 6 (calc) — ABNORMAL HIGH (ref <5.0)
Triglycerides: 181 mg/dL — ABNORMAL HIGH (ref <150)

## 2018-07-12 LAB — PSA, TOTAL WITH REFLEX TO PSA, FREE: PSA, TOTAL: 0.5 ng/mL (ref <=4.0)

## 2018-07-12 LAB — HEMOGLOBIN A1C
HEMOGLOBIN A1C: 6.2 %{Hb} — AB (ref <5.7)
Mean Plasma Glucose: 131 (calc)
eAG (mmol/L): 7.3 (calc)

## 2018-07-15 ENCOUNTER — Ambulatory Visit (INDEPENDENT_AMBULATORY_CARE_PROVIDER_SITE_OTHER): Payer: 59 | Admitting: Osteopathic Medicine

## 2018-07-15 ENCOUNTER — Encounter: Payer: Self-pay | Admitting: Osteopathic Medicine

## 2018-07-15 ENCOUNTER — Telehealth: Payer: Self-pay

## 2018-07-15 VITALS — BP 132/84 | HR 72 | Temp 98.3°F | Wt 285.2 lb

## 2018-07-15 DIAGNOSIS — R21 Rash and other nonspecific skin eruption: Secondary | ICD-10-CM

## 2018-07-15 DIAGNOSIS — H029 Unspecified disorder of eyelid: Secondary | ICD-10-CM

## 2018-07-15 DIAGNOSIS — Z0000 Encounter for general adult medical examination without abnormal findings: Secondary | ICD-10-CM

## 2018-07-15 DIAGNOSIS — R7303 Prediabetes: Secondary | ICD-10-CM

## 2018-07-15 DIAGNOSIS — E782 Mixed hyperlipidemia: Secondary | ICD-10-CM

## 2018-07-15 LAB — POCT GLYCOSYLATED HEMOGLOBIN (HGB A1C): Hemoglobin A1C: 6.2 % — AB (ref 4.0–5.6)

## 2018-07-15 MED ORDER — ATORVASTATIN CALCIUM 40 MG PO TABS: 40.0000 mg | ORAL_TABLET | Freq: Every day | ORAL | 3 refills | Status: DC

## 2018-07-15 NOTE — Telephone Encounter (Signed)
Pt called this afternoon requesting that referrals for Dermatology & Ophthalmologist be [place]. Thanks.

## 2018-07-15 NOTE — Patient Instructions (Signed)
General Preventive Care  Most recent routine screening lipids/other labs: done.   Everyone should have blood pressure checked once per year.   Tobacco: don't! Alcohol: responsible moderation is ok for most adults - if you have concerns about your alcohol intake, please talk to me! Recreational/Illicit Drugs: don't!  Exercise: as tolerated to reduce risk of cardiovascular disease and diabetes. Strength training will also prevent osteoporosis.   Mental health: if need for mental health care (medicines, counseling, other), or concerns about moods, please let me know!   Sexual health: if need for STD testing, or if concerns with libido/pain problems, please let me know! If you need to discuss your birth control options, please let me know!  Vaccines  Flu vaccine: recommended for almost everyone, every fall (by Halloween! Flu is scary!), especially if you are pregnant, if you have exposure to the public, if you're around young children or the elderly, or if you're around pregnant people.   Shingles vaccine: Shingrix recommended after age 13 - will call you  Pneumonia vaccines: Prevnar and Pneumovax recommended after age 69, sooner if diabetes, COPD/asthma, others  Tetanus booster: Tdap recommended every 10 years - done 2011 Cancer screenings   Colon cancer screening: recommended for everyone at age 32, but some folks need a colonoscopy sooner if risk factors   Prostate cancer screening: recommendations vary, optional PSA blood test for men around age 19 Infection screenings . HIV: recommended screening at least once age 31-65 . Gonorrhea/Chlamydia: screening as needed . Hepatitis C: recommended for anyone born 68-1967 . TB: certain at-risk populations, or depending on work requirements and/or travel history Other . Bone Density Test: recommended for men at age 11, sooner depending on risk factors . Advanced Directive: Living Will and/or Healthcare Power of Attorney recommended for all  adults, regardless of age or health!

## 2018-07-15 NOTE — Progress Notes (Signed)
HPI: James Ware is a 57 y.o. male who  has a past medical history of Eczema (05/24/2017), Essential hypertension (05/24/2017), Prediabetes (05/24/2017), and Venous stasis dermatitis of both lower extremities (05/24/2017).  he presents to [facility] today, 07/15/18,  for chief complaint of: Annual Physical    Patient here for annual physical / wellness exam.  See preventive care reviewed as below.  Recent labs reviewed in detail with the patient.   Additional concerns today include:   Lesion on right lower eyelid seems to be getting bigger/bothering him.  Patchy dry skin on right back over scapula.  Not really itchy or too bothersome for him, has difficulty applying his usual eczema medication    Past medical, surgical, social and family history reviewed:  Patient Active Problem List   Diagnosis Date Noted  . Hearing loss 12/16/2017  . Mixed hyperlipidemia 05/27/2017  . 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 Vitamin D deficiency 05/27/2017  . Essential hypertension 05/24/2017  . Eczema 05/24/2017  . Venous stasis dermatitis of both lower extremities 05/24/2017  . Prediabetes 05/24/2017    No past surgical history on file.  Social History   Tobacco Use  . Smoking status: Former Research scientist (life sciences)  . Smokeless tobacco: Never Used  Substance Use Topics  . Alcohol use: Not on file    Family History  Problem Relation Age of Onset  . Hyperlipidemia Mother   . Cancer Father        bladder     Current medication list and allergy/intolerance information reviewed:    Current Outpatient Medications  Medication Sig Dispense Refill  . cetirizine (ZYRTEC) 10 MG tablet Take 10 mg by mouth daily.    . clobetasol ointment (TEMOVATE) 4.69 % Apply 1 application topically 2 (two) times daily. To affected area(s) as needed, max 2 weeks to avoid whitening/thinning skin 30 g 3  . hydrochlorothiazide (HYDRODIURIL) 25 MG tablet TAKE 1 TABLET BY MOUTH EVERY  DAY 90 tablet 1  . Krill Oil 300 MG CAPS Take 300 mg by mouth daily.    . Multiple Vitamin (MULTIVITAMIN) tablet Take by mouth.    . Probiotic Product (PROBIOTIC ACIDOPHILUS BEADS) CAPS Take by mouth.    . sildenafil (REVATIO) 20 MG tablet Take 1-5 tablets (20-100 mg total) by mouth as needed (prior to sex). 50 tablet 6   No current facility-administered medications for this visit.     No Known Allergies    Review of Systems:  Constitutional:  No  fever, no chills, No recent illness, No unintentional weight changes. No significant fatigue.   HEENT: No  headache, no vision change, no hearing change, No sore throat, No  sinus pressure  Cardiac: No  chest pain, No  pressure, No palpitations, No  Orthopnea  Respiratory:  No  shortness of breath. No  Cough  Gastrointestinal: No  abdominal pain, No  nausea, No  vomiting,  No  blood in stool, No  diarrhea, No  constipation   Musculoskeletal: No new myalgia/arthralgia  Skin: No  Rash, No other wounds/concerning lesions  Genitourinary: No  incontinence, No  abnormal genital bleeding, No abnormal genital discharge  Hem/Onc: No  easy bruising/bleeding, No  abnormal lymph node  Endocrine: No cold intolerance,  No heat intolerance. No polyuria/polydipsia/polyphagia   Neurologic: No  weakness, No  dizziness, No  slurred speech/focal weakness/facial droop  Psychiatric: No  concerns with depression, No  concerns with anxiety, No sleep problems, No mood problems  Exam:  BP 132/84 (  BP Location: Left Arm, Patient Position: Sitting, Cuff Size: Normal)   Pulse 72   Temp 98.3 F (36.8 C) (Oral)   Wt 285 lb 3.2 oz (129.4 kg)   BMI 35.18 kg/m   Constitutional: VS see above. General Appearance: alert, well-developed, well-nourished, NAD  Eyes: Normal lids and conjunctive, non-icteric sclera  Ears, Nose, Mouth, Throat: MMM, Normal external inspection ears/nares/mouth/lips/gums. TM normal bilaterally. Pharynx/tonsils no erythema, no exudate.  Nasal mucosa normal.   Neck: No masses, trachea midline. No thyroid enlargement. No tenderness/mass appreciated. No lymphadenopathy  Respiratory: Normal respiratory effort. no wheeze, no rhonchi, no rales  Cardiovascular: S1/S2 normal, no murmur, no rub/gallop auscultated. RRR. No lower extremity edema. Pedal pulse II/IV bilaterally DP and PT. No carotid bruit or JVD. No abdominal aortic bruit.  Gastrointestinal: Nontender, no masses. No hepatomegaly, no splenomegaly. No hernia appreciated. Bowel sounds normal. Rectal exam deferred.   Musculoskeletal: Gait normal. No clubbing/cyanosis of digits.   Neurological: Normal balance/coordination. No tremor. No cranial nerve deficit on limited exam. Motor and sensation intact and symmetric. Cerebellar reflexes intact.   Skin: warm, dry, intact.  Warty growth, right lower eyelid, not large enough to deform the border of the lid or affect vision.  About a inch diameter scaly red patch, questionable eczema versus actinic     Psychiatric: Normal judgment/insight. Normal mood and affect. Oriented x3.    Results for orders placed or performed in visit on 07/15/18 (from the past 72 hour(s))  POCT HgB A1C     Status: Abnormal   Collection Time: 07/15/18  8:47 AM  Result Value Ref Range   Hemoglobin A1C 6.2 (A) 4.0 - 5.6 %   HbA1c POC (<> result, manual entry)     HbA1c, POC (prediabetic range)     HbA1c, POC (controlled diabetic range)          ASSESSMENT/PLAN:   Annual physical exam  Prediabetes - Plan: POCT HgB A1C, Lipid panel, VITAMIN D 25 Hydroxy (Vit-D Deficiency, Fractures), Hemoglobin A1c, COMPLETE METABOLIC PANEL WITH GFR  Eyelid lesion - Plan: Ambulatory referral to Ophthalmology  Rash and nonspecific skin eruption - Plan: Ambulatory referral to Dermatology  Mixed hyperlipidemia - Plan: Lipid panel, VITAMIN D 25 Hydroxy (Vit-D Deficiency, Fractures), Hemoglobin A1c, COMPLETE METABOLIC PANEL WITH GFR    Patient Instructions   General Preventive Care  Most recent routine screening lipids/other labs: done.   Everyone should have blood pressure checked once per year.   Tobacco: don't! Alcohol: responsible moderation is ok for most adults - if you have concerns about your alcohol intake, please talk to me! Recreational/Illicit Drugs: don't!  Exercise: as tolerated to reduce risk of cardiovascular disease and diabetes. Strength training will also prevent osteoporosis.   Mental health: if need for mental health care (medicines, counseling, other), or concerns about moods, please let me know!   Sexual health: if need for STD testing, or if concerns with libido/pain problems, please let me know! If you need to discuss your birth control options, please let me know!  Vaccines  Flu vaccine: recommended for almost everyone, every fall (by Halloween! Flu is scary!), especially if you are pregnant, if you have exposure to the public, if you're around young children or the elderly, or if you're around pregnant people.   Shingles vaccine: Shingrix recommended after age 51 - will call you  Pneumonia vaccines: Prevnar and Pneumovax recommended after age 56, sooner if diabetes, COPD/asthma, others  Tetanus booster: Tdap recommended every 10 years - done 2011 Cancer  screenings   Colon cancer screening: recommended for everyone at age 55, but some folks need a colonoscopy sooner if risk factors   Prostate cancer screening: recommendations vary, optional PSA blood test for men around age 105 Infection screenings . HIV: recommended screening at least once age 41-65 . Gonorrhea/Chlamydia: screening as needed . Hepatitis C: recommended for anyone born 31-1967 . TB: certain at-risk populations, or depending on work requirements and/or travel history Other . Bone Density Test: recommended for men at age 59, sooner depending on risk factors . Advanced Directive: Living Will and/or Healthcare Power of Attorney recommended for  all adults, regardless of age or health!          Immunization History  Administered Date(s) Administered  . Influenza Inj Mdck Quad Pf 07/11/2018  . Influenza,inj,Quad PF,6+ Mos 05/20/2017  . Influenza-Unspecified 07/11/2018  . Tdap 04/28/2010    The 10-year ASCVD risk score Mikey Bussing DC Brooke Bonito., et al., 2013) is: 13%   Values used to calculate the score:     Age: 54 years     Sex: Male     Is Non-Hispanic African American: No     Diabetic: No     Tobacco smoker: No     Systolic Blood Pressure: 622 mmHg     Is BP treated: Yes     HDL Cholesterol: 31 mg/dL     Total Cholesterol: 187 mg/dL      Visit summary with medication list and pertinent instructions was printed for patient to review. All questions at time of visit were answered - patient instructed to contact office with any additional concerns. ER/RTC precautions were reviewed with the patient.   Follow-up plan: Return in about 4 months (around 11/15/2018) for recheck labs - A1C and Lipids .    Please note: voice recognition software was used to produce this document, and typos may escape review. Please contact Dr. Sheppard Coil for any needed clarifications.

## 2018-07-17 ENCOUNTER — Encounter: Payer: Self-pay | Admitting: Osteopathic Medicine

## 2018-07-19 NOTE — Telephone Encounter (Signed)
Going to send referral to [place] locations. - CF

## 2018-08-01 ENCOUNTER — Telehealth: Payer: Self-pay

## 2018-08-01 NOTE — Telephone Encounter (Signed)
Pt called - he rec'd a confirmation call for Dermatology office in [place]. Pt requested for referral to be sent to a facility in [place]. Pls refer to previous note on 07/15/18. Pt does not want to be charged for a no show fee, since he does not want to go to [place] office. Patient is requesting a call back to see if referral request has been resolved. Thanks in advance.

## 2018-08-03 NOTE — Telephone Encounter (Signed)
Pt has been updated. No other inquiries during call.  

## 2018-08-03 NOTE — Telephone Encounter (Signed)
I sent referral to a Dermatologist in [place] previous him calling about James Ware I have changed the referral to [place] so he just needs to call the [place] office and cancel the appointment. - CF

## 2018-08-15 ENCOUNTER — Encounter: Payer: Self-pay | Admitting: Osteopathic Medicine

## 2018-09-16 ENCOUNTER — Encounter: Payer: Self-pay | Admitting: Osteopathic Medicine

## 2018-09-16 MED ORDER — TADALAFIL 10 MG PO TABS: 10.0000 mg | ORAL_TABLET | Freq: Every day | ORAL | 3 refills | Status: DC | PRN

## 2018-09-29 ENCOUNTER — Encounter: Payer: Self-pay | Admitting: Osteopathic Medicine

## 2018-11-17 ENCOUNTER — Ambulatory Visit: Payer: 59 | Admitting: Osteopathic Medicine

## 2018-12-10 LAB — HEMOGLOBIN A1C
EAG (MMOL/L): 8.2 (calc)
Hgb A1c MFr Bld: 6.8 % of total Hgb — ABNORMAL HIGH (ref <5.7)
MEAN PLASMA GLUCOSE: 148 (calc)

## 2018-12-10 LAB — LIPID PANEL
CHOLESTEROL: 122 mg/dL (ref <200)
HDL: 33 mg/dL — ABNORMAL LOW (ref >=40)
LDL Cholesterol (Calc): 69 mg/dL (calc)
NON-HDL CHOLESTEROL (CALC): 89 mg/dL (ref <130)
Total CHOL/HDL Ratio: 3.7 (calc) (ref <5.0)
Triglycerides: 122 mg/dL (ref <150)

## 2018-12-10 LAB — COMPLETE METABOLIC PANEL WITH GFR
AG RATIO: 1.4 (calc) (ref 1.0–2.5)
ALT: 24 U/L (ref 9–46)
AST: 17 U/L (ref 10–35)
Albumin: 4.2 g/dL (ref 3.6–5.1)
Alkaline phosphatase (APISO): 85 U/L (ref 35–144)
BILIRUBIN TOTAL: 1.3 mg/dL — AB (ref 0.2–1.2)
BUN: 20 mg/dL (ref 7–25)
CALCIUM: 9.6 mg/dL (ref 8.6–10.3)
CHLORIDE: 106 mmol/L (ref 98–110)
CO2: 23 mmol/L (ref 20–32)
Creat: 0.86 mg/dL (ref 0.70–1.33)
GFR, Est African American: 110 mL/min/{1.73_m2} (ref >=60)
GFR, Est Non African American: 95 mL/min/{1.73_m2} (ref >=60)
Globulin: 2.9 g/dL (calc) (ref 1.9–3.7)
Glucose, Bld: 134 mg/dL — ABNORMAL HIGH (ref 65–99)
POTASSIUM: 3.8 mmol/L (ref 3.5–5.3)
Sodium: 142 mmol/L (ref 135–146)
Total Protein: 7.1 g/dL (ref 6.1–8.1)

## 2018-12-10 LAB — VITAMIN D 25 HYDROXY (VIT D DEFICIENCY, FRACTURES): VIT D 25 HYDROXY: 35 ng/mL (ref 30–100)

## 2018-12-11 ENCOUNTER — Other Ambulatory Visit: Payer: Self-pay | Admitting: Osteopathic Medicine

## 2018-12-11 DIAGNOSIS — I10 Essential (primary) hypertension: Secondary | ICD-10-CM

## 2018-12-13 ENCOUNTER — Other Ambulatory Visit: Payer: Self-pay

## 2018-12-13 ENCOUNTER — Encounter: Payer: Self-pay | Admitting: Osteopathic Medicine

## 2018-12-13 ENCOUNTER — Ambulatory Visit: Payer: 59 | Admitting: Osteopathic Medicine

## 2018-12-13 VITALS — BP 132/79 | HR 75 | Temp 98.1°F | Wt 292.1 lb

## 2018-12-13 DIAGNOSIS — E1169 Type 2 diabetes mellitus with other specified complication: Secondary | ICD-10-CM | POA: Diagnosis not present

## 2018-12-13 LAB — POCT GLYCOSYLATED HEMOGLOBIN (HGB A1C): Hemoglobin A1C: 7.4 % — AB (ref 4.0–5.6)

## 2018-12-13 NOTE — Patient Instructions (Addendum)
If A1c/weight is not looking better in the next 3 months, would strongly consider adding anti-diabetic medication, probably metformin and/or one of the injectables to use once weekly such as Ozempic, Trulicity, Bydureon.  Since we have not had to A1c levels in the diabetic range, you are officially considered a type II diabetic, so let's get this under control!  Anything else you need, let me know!

## 2018-12-13 NOTE — Progress Notes (Signed)
HPI: James Ware is a 58 y.o. male who  has a past medical history of Eczema (05/24/2017), Essential hypertension (05/24/2017), Prediabetes (05/24/2017), and Venous stasis dermatitis of both lower extremities (05/24/2017).  he presents to [facility] today, 12/13/18,  for chief complaint of:  Follow up prediabetes   Patient here today for follow-up A1c, history of prediabetes though he did have one A1c in the diabetic range last year.  A1c is again in the diabetic range.  Patient has also gained some weight.   Wt Readings from Last 3 Encounters:  12/13/18 292 lb 1.6 oz (132.5 kg)  07/15/18 285 lb 3.2 oz (129.4 kg)  03/24/18 286 lb 8 oz (130 kg)   BP Readings from Last 3 Encounters:  12/13/18 132/79  07/15/18 132/84  03/24/18 133/71      At today's visit 12/13/18 ... PMH, PSH, FH reviewed and updated as needed.  Current medication list and allergy/intolerance hx reviewed and updated as needed. (See remainder of HPI, ROS, Phys Exam below)   Recent Results (from the past 2160 hour(s))  Lipid panel     Status: Abnormal   Collection Time: 12/09/18  8:33 AM  Result Value Ref Range   Cholesterol 122 <200 mg/dL   HDL 33 (L) > OR = 40 mg/dL   Triglycerides 122 <150 mg/dL   LDL Cholesterol (Calc) 69 mg/dL (calc)    Comment: Reference range: <100 . Desirable range <100 mg/dL for primary prevention;   <70 mg/dL for patients with CHD or diabetic patients  with > or = 2 CHD risk factors. Marland Kitchen LDL-C is now calculated using the Martin-Hopkins  calculation, which is a validated novel method providing  better accuracy than the Friedewald equation in the  estimation of LDL-C.  Cresenciano Genre et al. Annamaria Helling. 7062;376(28): 2061-2068  (http://education.[facility].com/faq/FAQ164)    Total CHOL/HDL Ratio 3.7 <5.0 (calc)   Non-HDL Cholesterol (Calc) 89 <130 mg/dL (calc)    Comment: For patients with diabetes plus 1 major ASCVD risk  factor, treating to a  non-HDL-C goal of <100 mg/dL  (LDL-C of <70 mg/dL) is considered a therapeutic  option.   VITAMIN D 25 Hydroxy (Vit-D Deficiency, Fractures)     Status: None   Collection Time: 12/09/18  8:33 AM  Result Value Ref Range   Vit D, 25-Hydroxy 35 30 - 100 ng/mL    Comment: Vitamin D Status         25-OH Vitamin D: . Deficiency:                    <20 ng/mL Insufficiency:             20 - 29 ng/mL Optimal:                 > or = 30 ng/mL . For 25-OH Vitamin D testing on patients on  D2-supplementation and patients for whom quantitation  of D2 and D3 fractions is required, the QuestAssureD(TM) 25-OH VIT D, (D2,D3), LC/MS/MS is recommended: order  code [id number] (patients >32yrs). . For more information on this test, go to: http://education.questdiagnostics.com/faq/FAQ163 (This link is being provided for  informational/educational purposes only.)   Hemoglobin A1c     Status: Abnormal   Collection Time: 12/09/18  8:33 AM  Result Value Ref Range   Hgb A1c MFr Bld 6.8 (H) <5.7 % of total Hgb    Comment: For someone without known diabetes, a hemoglobin A1c value of 6.5% or greater indicates that they  may have  diabetes and this should be confirmed with a follow-up  test. . For someone with known diabetes, a value <7% indicates  that their diabetes is well controlled and a value  greater than or equal to 7% indicates suboptimal  control. A1c targets should be individualized based on  duration of diabetes, age, comorbid conditions, and  other considerations. . Currently, no consensus exists regarding use of hemoglobin A1c for diagnosis of diabetes for children. .    Mean Plasma Glucose 148 (calc)   eAG (mmol/L) 8.2 (calc)  COMPLETE METABOLIC PANEL WITH GFR     Status: Abnormal   Collection Time: 12/09/18  8:33 AM  Result Value Ref Range   Glucose, Bld 134 (H) 65 - 99 mg/dL    Comment: .            Fasting reference interval . For someone without known diabetes, a glucose value  >125 mg/dL indicates that they may have diabetes and this should be confirmed with a follow-up test. .    BUN 20 7 - 25 mg/dL   Creat 0.86 0.70 - 1.33 mg/dL    Comment: For patients >31 years of age, the reference limit for Creatinine is approximately 13% higher for people identified as African-American. .    GFR, Est Non African American 95 > OR = 60 mL/min/1.62m2   GFR, Est African American 110 > OR = 60 mL/min/1.46m2   BUN/Creatinine Ratio NOT APPLICABLE 6 - 22 (calc)   Sodium 142 135 - 146 mmol/L   Potassium 3.8 3.5 - 5.3 mmol/L   Chloride 106 98 - 110 mmol/L   CO2 23 20 - 32 mmol/L   Calcium 9.6 8.6 - 10.3 mg/dL   Total Protein 7.1 6.1 - 8.1 g/dL   Albumin 4.2 3.6 - 5.1 g/dL   Globulin 2.9 1.9 - 3.7 g/dL (calc)   AG Ratio 1.4 1.0 - 2.5 (calc)   Total Bilirubin 1.3 (H) 0.2 - 1.2 mg/dL   Alkaline phosphatase (APISO) 85 35 - 144 U/L   AST 17 10 - 35 U/L   ALT 24 9 - 46 U/L  POCT HgB A1C     Status: Abnormal   Collection Time: 12/13/18  8:09 AM  Result Value Ref Range   Hemoglobin A1C 7.4 (A) 4.0 - 5.6 %   HbA1c POC (<> result, manual entry)     HbA1c, POC (prediabetic range)     HbA1c, POC (controlled diabetic range)            ASSESSMENT/PLAN: The encounter diagnosis was Controlled type 2 diabetes mellitus with other specified complication, without long-term current use of insulin ([place]).  Orders Placed This Encounter  Procedures  . POCT HgB A1C     Patient declines medication adjustment at this time.  He would like to get more diligent again about diet/exercise and see how the A1c is looking.  We discussed the natural history of diabetes, benefit versus risk of medications, I think the benefit probably outweighs the risk but if his goal is to avoid medications, I can work with him as long as the numbers are improving.   Patient Instructions  If A1c/weight is not looking better in the next 3 months, would strongly consider adding anti-diabetic medication,  probably metformin and/or one of the injectables to use once weekly such as Ozempic, Trulicity, Bydureon.  Since we have not had to A1c levels in the diabetic range, you are officially considered a type II diabetic, so let's get  this under control!  Anything else you need, let me know!       Follow-up plan: Return in about 3 months (around 03/15/2019) for recheck A1C and weight.                                                 ################################################# ################################################# ################################################# #################################################    Current Meds  Medication Sig  . atorvastatin (LIPITOR) 40 MG tablet Take 1 tablet (40 mg total) by mouth daily.  . cetirizine (ZYRTEC) 10 MG tablet Take 10 mg by mouth daily.  . clobetasol ointment (TEMOVATE) 8.25 % Apply 1 application topically 2 (two) times daily. To affected area(s) as needed, max 2 weeks to avoid whitening/thinning skin  . hydrochlorothiazide (HYDRODIURIL) 25 MG tablet TAKE 1 TABLET BY MOUTH EVERY DAY  . Krill Oil 300 MG CAPS Take 300 mg by mouth daily.  . Multiple Vitamin (MULTIVITAMIN) tablet Take by mouth.  . Probiotic Product (PROBIOTIC ACIDOPHILUS BEADS) CAPS Take by mouth.  . sildenafil (REVATIO) 20 MG tablet Take 1-5 tablets (20-100 mg total) by mouth as needed (prior to sex).  . tadalafil (CIALIS) 10 MG tablet Take 1-2 tablets (10-20 mg total) by mouth daily as needed for erectile dysfunction.    No Known Allergies     Review of Systems:  Constitutional: No recent illness  HEENT: No  headache, no vision change  Cardiac: No  chest pain  Respiratory:  No  shortness of breath. No  Cough  Gastrointestinal: No  abdominal pain  Musculoskeletal: No new myalgia/arthralgia  Skin: No  Rash  Neurologic: No  weakness, No  Dizziness  Psychiatric: No  concerns with depression, No   concerns with anxiety   Exam:  BP 132/79 (BP Location: Left Arm, Patient Position: Sitting, Cuff Size: Large)   Pulse 75   Temp 98.1 F (36.7 C) (Oral)   Wt 292 lb 1.6 oz (132.5 kg)   BMI 36.03 kg/m   Constitutional: VS see above. General Appearance: alert, well-developed, well-nourished, NAD  Eyes: Normal lids and conjunctive, non-icteric sclera  Ears, Nose, Mouth, Throat: MMM, Normal external inspection ears/nares/mouth/lips/gums.  Neck: No masses, trachea midline.   Respiratory: Normal respiratory effort. no wheeze, no rhonchi, no rales  Cardiovascular: S1/S2 normal, no murmur, no rub/gallop auscultated. RRR.   Musculoskeletal: Gait normal. Symmetric and independent movement of all extremities  Neurological: Normal balance/coordination. No tremor.  Skin: warm, dry, intact.   Psychiatric: Normal judgment/insight. Normal mood and affect. Oriented x3.       Visit summary with medication list and pertinent instructions was printed for patient to review, patient was advised to alert [place] if any updates are needed. All questions at time of visit were answered - patient instructed to contact office with any additional concerns. ER/RTC precautions were reviewed with the patient and understanding verbalized.   Note: Total time spent 25 minutes, greater than 50% of the visit was spent face-to-face counseling and coordinating care for the following: The encounter diagnosis was Controlled type 2 diabetes mellitus with other specified complication, without long-term current use of insulin ([place])..  Please note: voice recognition software was used to produce this document, and typos may escape review. Please contact Dr. Sheppard Coil for any needed clarifications.    Follow up plan: Return in about 3 months (around 03/15/2019) for recheck A1C and weight.

## 2019-02-13 ENCOUNTER — Encounter: Payer: Self-pay | Admitting: Osteopathic Medicine

## 2019-02-13 MED ORDER — TADALAFIL 10 MG PO TABS: 10.0000 mg | ORAL_TABLET | Freq: Every day | ORAL | 3 refills | Status: DC | PRN

## 2019-02-21 ENCOUNTER — Encounter: Payer: Self-pay | Admitting: Osteopathic Medicine

## 2019-03-15 ENCOUNTER — Ambulatory Visit: Payer: 59 | Admitting: Osteopathic Medicine

## 2019-03-22 ENCOUNTER — Inpatient Hospital Stay
Admission: RE | Admit: 2019-03-22 | Discharge: 2019-03-22 | Disposition: A | Discharge status: Home / Self Care | Payer: 59 | Source: Ambulatory Visit

## 2019-03-22 ENCOUNTER — Ambulatory Visit (INDEPENDENT_AMBULATORY_CARE_PROVIDER_SITE_OTHER)
Admission: RE | Admit: 2019-03-22 | Discharge: 2019-03-22 | Disposition: A | Discharge status: Home / Self Care | Payer: 59 | Source: Ambulatory Visit

## 2019-03-22 DIAGNOSIS — T7840XA Allergy, unspecified, initial encounter: Secondary | ICD-10-CM | POA: Diagnosis not present

## 2019-03-22 NOTE — ED Provider Notes (Signed)
Virtual Visit via Video Note:  James Ware  initiated request for Telemedicine visit with [facility] Urgent [facility] team. I connected with James Ware  on 03/22/2019 at 10:19 AM  for a synchronized telemedicine visit using a video enabled HIPPA compliant telemedicine application. I verified that I am speaking with James Ware  using two identifiers. Orvan July, NP  was physically located in a [facility] Urgent care site and James Ware was located at a different location.   The limitations of evaluation and management by telemedicine as well as the availability of in-person appointments were discussed. Patient was informed that he  may incur a bill ( including co-pay) for this virtual visit encounter. James Ware  expressed understanding and gave verbal consent to proceed with virtual visit.     History of Present Illness:James Ware  is a 58 y.o. male presents with dry scratchy throat, sore throat, rhinorrhea.  Symptoms have been present is somewhat improved over the weekend.  He reports some tenderness in the throat area.  Denies any trouble swallowing or significant pain with swallowing.  Reports history of allergies.  Reports that he was outside most of the weekend.  His symptoms started shortly after this.  Denies any associated fever, chills, body aches, night sweats, cough, chest congestion.  He has had some mild pressure in both ears.  History of eustachian tube dysfunction.  He does have Zyrtec but has not been taking it.  Past Medical History:  Diagnosis Date  . Eczema 05/24/2017  . Essential hypertension 05/24/2017  . Prediabetes 05/24/2017  . Venous stasis dermatitis of both lower extremities 05/24/2017    No Known Allergies      Observations/Objective:.GENERAL APPEARANCE: Well developed, well nourished, alert and cooperative, and appears to be in no acute distress. HEAD: normocephalic. Non labored breathing, no dyspnea or distress Skin: Skin normal color  PSYCHIATRIC: The  mental examination revealed the patient was oriented to person, place, and time. The patient was able to demonstrate good judgement and reason, without hallucinations, abnormal affect or abnormal behaviors during the examination. Patient is not suicidal     Assessment and Plan: Symptoms most consistent with allergies.  We will have him take Flonase and Zyrtec for symptoms.  Patient worried about strep throat.  Not convinced of this.   Follow Up Instructions:  Follow-up in person over the next couple days if the symptoms do not improve or worsen.    I discussed the assessment and treatment plan with the patient. The patient was provided an opportunity to ask questions and all were answered. The patient agreed with the plan and demonstrated an understanding of the instructions.   The patient was advised to call back or seek an in-person evaluation if the symptoms worsen or if the condition fails to improve as anticipated.      Orvan July, NP  03/22/2019 10:19 AM        Orvan July, NP 03/23/19 0800

## 2019-03-24 ENCOUNTER — Other Ambulatory Visit: Payer: Self-pay

## 2019-03-24 ENCOUNTER — Emergency Department (INDEPENDENT_AMBULATORY_CARE_PROVIDER_SITE_OTHER)
Admission: EM | Admit: 2019-03-24 | Discharge: 2019-03-24 | Disposition: A | Discharge status: Home / Self Care | Payer: 59 | Source: Home / Self Care

## 2019-03-24 DIAGNOSIS — J029 Acute pharyngitis, unspecified: Secondary | ICD-10-CM

## 2019-03-24 LAB — POCT RAPID STREP A (OFFICE): Rapid Strep A Screen: NEGATIVE

## 2019-03-24 NOTE — Discharge Instructions (Signed)
°  You may continue to take your allergy medication as it seems that is the source of your symptoms. If you feel worse tomorrow with cough, fever, headache, body aches, it is recommended that you postpone the visit with your mother.  Otherwise, you can take precaution by washing your hands and both of you wearing a face mask during your visit in the small chance either of you has the virus without showing typical symptoms.  Please follow up in 1 week if not improving, sooner if worsening.

## 2019-03-24 NOTE — ED Provider Notes (Signed)
[name]    CSN: [id number] Arrival date & time: 03/24/19  1029     History   Chief Complaint Chief Complaint  Patient presents with  . Sore Throat    HPI James Ware is a 58 y.o. male.   HPI  James Ware is a 58 y.o. male presenting to UC with c/o 2 days of sore throat and a "hot" feeling on the front of his neck. Symptoms started after going for a hike with his son in [place] earlier in the week. He called [facility] who recommended he start taking allergy medication. He has tried [facility] and Claritin and states symptoms have improved greatly, but they are still present.  He is suppose to visit his 68yo mother tomorrow and wants to make sure he is not contagious. No known sick contacts. Denies fever, chills, n/v/d. Denies HA or body aches.    Past Medical History:  Diagnosis Date  . Eczema 05/24/2017  . Essential hypertension 05/24/2017  . Prediabetes 05/24/2017  . Venous stasis dermatitis of both lower extremities 05/24/2017    Patient Active Problem List   Diagnosis Date Noted  . Hearing loss 12/16/2017  . Mixed hyperlipidemia 05/27/2017  . Decreased platelet count ([place]) 05/27/2017  . Vitamin D deficiency 05/27/2017  . Essential hypertension 05/24/2017  . Eczema 05/24/2017  . Venous stasis dermatitis of both lower extremities 05/24/2017  . Prediabetes 05/24/2017    No past surgical history on file.     Home Medications    Prior to Admission medications   Medication Sig Start Date End Date Taking? Authorizing Provider  atorvastatin (LIPITOR) 40 MG tablet Take 1 tablet (40 mg total) by mouth daily. 07/15/18   Emeterio Reeve, DO  cetirizine (ZYRTEC) 10 MG tablet Take 10 mg by mouth daily.    [provider]  clobetasol ointment (TEMOVATE) 3.57 % Apply 1 application topically 2 (two) times daily. To affected area(s) as needed, max 2 weeks to avoid whitening/thinning skin 05/20/17   Emeterio Reeve, DO  hydrochlorothiazide (HYDRODIURIL)  25 MG tablet TAKE 1 TABLET BY MOUTH EVERY DAY 12/12/18   Emeterio Reeve, DO  Krill Oil 300 MG CAPS Take 300 mg by mouth daily.    [provider]  Multiple Vitamin (MULTIVITAMIN) tablet Take by mouth.    [provider]  Probiotic Product (PROBIOTIC ACIDOPHILUS BEADS) CAPS Take by mouth.    [provider]  sildenafil (REVATIO) 20 MG tablet Take 1-5 tablets (20-100 mg total) by mouth as needed (prior to sex). 09/14/17   Emeterio Reeve, DO  tadalafil (CIALIS) 10 MG tablet Take 1-2 tablets (10-20 mg total) by mouth daily as needed for erectile dysfunction. Ok to fill early 02/13/19   Emeterio Reeve, DO    Family History Family History  Problem Relation Age of Onset  . Hyperlipidemia Mother   . Cancer Father        bladder    Social History Social History   Tobacco Use  . Smoking status: Former Research scientist (life sciences)  . Smokeless tobacco: Never Used  Substance Use Topics  . Alcohol use: Not on file  . Drug use: Not on file     Allergies   Patient has no known allergies.   Review of Systems Review of Systems  Constitutional: Negative for chills and fever.  HENT: Positive for sore throat. Negative for congestion, ear pain, trouble swallowing and voice change.   Respiratory: Negative for cough and shortness of breath.   Cardiovascular: Negative for chest pain and  palpitations.  Gastrointestinal: Negative for abdominal pain, diarrhea, nausea and vomiting.  Musculoskeletal: Positive for neck pain. Negative for arthralgias, back pain, myalgias and neck stiffness.  Skin: Negative for rash.  Neurological: Negative for dizziness, light-headedness and headaches.     Physical Exam Triage Vital Signs ED Triage Vitals  Enc Vitals Group     BP 03/24/19 1057 (!) 145/97     Pulse Rate 03/24/19 1057 90     Resp 03/24/19 1057 20     Temp 03/24/19 1057 98.1 F (36.7 C)     Temp Source 03/24/19 1057 Oral     SpO2 03/24/19 1057 97 %     Weight 03/24/19 1059 289 lb  (131.1 kg)     Height 03/24/19 1059 6\' 3"  (1.905 m)     Head Circumference --      Peak Flow --      Pain Score 03/24/19 1058 0     Pain Loc --      Pain Edu? --      Excl. in [place]? --    No data found.  Updated Vital Signs BP (!) 145/97 (BP Location: Right Arm)   Pulse 90   Temp 98.1 F (36.7 C) (Oral)   Resp 20   Ht 6\' 3"  (1.905 m)   Wt 289 lb (131.1 kg)   SpO2 97%   BMI 36.12 kg/m   Visual Acuity Right Eye Distance:   Left Eye Distance:   Bilateral Distance:    Right Eye Near:   Left Eye Near:    Bilateral Near:     Physical Exam Vitals signs and nursing note reviewed.  Constitutional:      Appearance: He is well-developed.  HENT:     Head: Normocephalic and atraumatic.     Right Ear: Tympanic membrane normal.     Left Ear: Tympanic membrane normal.     Nose: Nose normal.     Right Sinus: No maxillary sinus tenderness or frontal sinus tenderness.     Left Sinus: No maxillary sinus tenderness or frontal sinus tenderness.     Mouth/Throat:     Lips: Pink.     Mouth: Mucous membranes are moist.     Pharynx: Oropharynx is clear. Uvula midline. Posterior oropharyngeal erythema present. No pharyngeal swelling, oropharyngeal exudate or uvula swelling.  Neck:     Musculoskeletal: Normal range of motion and neck supple.  Cardiovascular:     Rate and Rhythm: Normal rate and regular rhythm.  Pulmonary:     Effort: Pulmonary effort is normal.  Musculoskeletal: Normal range of motion.  Lymphadenopathy:     Cervical: No cervical adenopathy.  Skin:    General: Skin is warm and dry.     Findings: Erythema (faint to anterior neck and jawline- no warmth. no tenderness) present.  Neurological:     Mental Status: He is alert and oriented to person, place, and time.  Psychiatric:        Behavior: Behavior normal.      UC Treatments / Results  Labs (all labs ordered are listed, but only abnormal results are displayed) Labs Reviewed  STREP A DNA PROBE  POCT RAPID  STREP A (OFFICE)    EKG None  Radiology No results found.  Procedures Procedures (including critical care time)  Medications Ordered in UC Medications - No data to display  Initial Impression / Assessment and Plan / UC Course  I have reviewed the triage vital signs and the nursing notes.  Pertinent labs &  imaging results that were available during my care of the patient were reviewed by me and considered in my medical decision making (see chart for details).    Rapid strep: NEGATIVE Culture sent Home care info provided  Final Clinical Impressions(s) / UC Diagnoses   Final diagnoses:  Sore throat     Discharge Instructions      You may continue to take your allergy medication as it seems that is the source of your symptoms. If you feel worse tomorrow with cough, fever, headache, body aches, it is recommended that you postpone the visit with your mother.  Otherwise, you can take precaution by washing your hands and both of you wearing a face mask during your visit in the small chance either of you has the virus without showing typical symptoms.  Please follow up in 1 week if not improving, sooner if worsening.     ED Prescriptions    None     Controlled Substance Prescriptions [place] Controlled Substance Registry consulted? Not Applicable   James Ware 03/24/19 1245

## 2019-03-24 NOTE — ED Triage Notes (Signed)
Pt had a scratchy throat since Saturday.  That has resolved.  States that throat on the outside feels warm, and upper chest feels warm.

## 2019-03-25 ENCOUNTER — Telehealth: Payer: Self-pay | Admitting: Emergency Medicine

## 2019-03-25 LAB — STREP A DNA PROBE: Group A Strep Probe: NOT DETECTED

## 2019-03-26 ENCOUNTER — Telehealth: Payer: Self-pay | Admitting: Emergency Medicine

## 2019-03-26 NOTE — Telephone Encounter (Signed)
Message left on voice mail inquiring about patient's status and encouraging patient to call with questions/concerns. Stated strep dna negative.

## 2019-04-07 ENCOUNTER — Other Ambulatory Visit: Payer: Self-pay | Admitting: Osteopathic Medicine

## 2019-04-07 ENCOUNTER — Other Ambulatory Visit: Payer: Self-pay | Admitting: *Deleted

## 2019-04-07 ENCOUNTER — Telehealth: Payer: Self-pay | Admitting: Osteopathic Medicine

## 2019-04-07 DIAGNOSIS — E1169 Type 2 diabetes mellitus with other specified complication: Secondary | ICD-10-CM

## 2019-04-08 LAB — COMPLETE METABOLIC PANEL WITH GFR
AG Ratio: 1.6 (calc) (ref 1.0–2.5)
ALT: 37 U/L (ref 9–46)
AST: 24 U/L (ref 10–35)
Albumin: 4.2 g/dL (ref 3.6–5.1)
Alkaline phosphatase (APISO): 82 U/L (ref 35–144)
BUN: 11 mg/dL (ref 7–25)
CO2: 25 mmol/L (ref 20–32)
Calcium: 9.7 mg/dL (ref 8.6–10.3)
Chloride: 104 mmol/L (ref 98–110)
Creat: 0.9 mg/dL (ref 0.70–1.33)
GFR, Est African American: 108 mL/min/{1.73_m2} (ref >=60)
GFR, Est Non African American: 93 mL/min/{1.73_m2} (ref >=60)
Globulin: 2.7 g/dL (calc) (ref 1.9–3.7)
Glucose, Bld: 115 mg/dL — ABNORMAL HIGH (ref 65–99)
Potassium: 3.6 mmol/L (ref 3.5–5.3)
Sodium: 141 mmol/L (ref 135–146)
Total Bilirubin: 1 mg/dL (ref 0.2–1.2)
Total Protein: 6.9 g/dL (ref 6.1–8.1)

## 2019-04-08 LAB — HEMOGLOBIN A1C
Hgb A1c MFr Bld: 5.9 % of total Hgb — ABNORMAL HIGH (ref <5.7)
Mean Plasma Glucose: 123 (calc)
eAG (mmol/L): 6.8 (calc)

## 2019-04-08 LAB — LIPID PANEL
Cholesterol: 123 mg/dL (ref <200)
HDL: 32 mg/dL — ABNORMAL LOW (ref >=40)
LDL Cholesterol (Calc): 68 mg/dL (calc)
Non-HDL Cholesterol (Calc): 91 mg/dL (calc) (ref <130)
Total CHOL/HDL Ratio: 3.8 (calc) (ref <5.0)
Triglycerides: 155 mg/dL — ABNORMAL HIGH (ref <150)

## 2019-04-11 ENCOUNTER — Other Ambulatory Visit: Payer: Self-pay

## 2019-04-11 ENCOUNTER — Encounter: Payer: Self-pay | Admitting: Osteopathic Medicine

## 2019-04-11 ENCOUNTER — Ambulatory Visit (INDEPENDENT_AMBULATORY_CARE_PROVIDER_SITE_OTHER): Payer: 59 | Admitting: Osteopathic Medicine

## 2019-04-11 VITALS — BP 131/78 | HR 75 | Temp 98.7°F | Wt 286.7 lb

## 2019-04-11 DIAGNOSIS — E782 Mixed hyperlipidemia: Secondary | ICD-10-CM

## 2019-04-11 DIAGNOSIS — R7303 Prediabetes: Secondary | ICD-10-CM | POA: Diagnosis not present

## 2019-04-11 DIAGNOSIS — I10 Essential (primary) hypertension: Secondary | ICD-10-CM

## 2019-04-11 DIAGNOSIS — N529 Male erectile dysfunction, unspecified: Secondary | ICD-10-CM

## 2019-04-11 MED ORDER — ATORVASTATIN CALCIUM 20 MG PO TABS: 20.0000 mg | ORAL_TABLET | Freq: Every day | ORAL | 0 refills | Status: DC

## 2019-04-11 MED ORDER — TADALAFIL 20 MG PO TABS: 10.0000 mg | ORAL_TABLET | Freq: Every day | ORAL | 3 refills | Status: DC | PRN

## 2019-04-11 NOTE — Progress Notes (Signed)
HPI: James Ware is a 58 y.o. male who  has a past medical history of Eczema (05/24/2017), Essential hypertension (05/24/2017), Prediabetes (05/24/2017), and Venous stasis dermatitis of both lower extremities (05/24/2017).  he presents to [facility] today, 04/11/19,  for chief complaint of:  Follow-up on sugars, cholesterol, ED    Doing well on current meds Cialis works better than Viagra  Weight is coming down Sugars are looking great    Wt Readings from Last 3 Encounters:  04/11/19 286 lb 11.2 oz (130 kg)  03/24/19 289 lb (131.1 kg)  12/13/18 292 lb 1.6 oz (132.5 kg)   BP Readings from Last 3 Encounters:  04/11/19 131/78  03/24/19 (!) 145/97  12/13/18 132/79         At today's visit 04/11/19 ... PMH, PSH, FH reviewed and updated as needed.  Current medication list and allergy/intolerance hx reviewed and updated as needed. (See remainder of HPI, ROS, Phys Exam below)   No results found.  No results found for this or any previous visit (from the past 72 hour(s)).        ASSESSMENT/PLAN: The primary encounter diagnosis was Prediabetes. Diagnoses of Essential hypertension, Mixed hyperlipidemia, and Erectile dysfunction, unspecified erectile dysfunction type were also pertinent to this visit.   Orders Placed This Encounter  Procedures  . Lipid panel     Meds ordered this encounter  Medications  . tadalafil (CIALIS) 20 MG tablet    Sig: Take 0.5-1 tablets (10-20 mg total) by mouth daily as needed for erectile dysfunction. Ok to fill early    Dispense:  15 tablet    Refill:  3    Cancel the 10 mg dose  . atorvastatin (LIPITOR) 20 MG tablet    Sig: Take 1 tablet (20 mg total) by mouth daily.    Dispense:  90 tablet    Refill:  0       Follow-up plan: Return in about 4 weeks (around 05/09/2019) for LAB VISIT ONLY to rechekc cholesterol. See me in 3 months foa annual physical  .                                                 ################################################# ################################################# ################################################# #################################################    Current Meds  Medication Sig  . atorvastatin (LIPITOR) 20 MG tablet Take 1 tablet (20 mg total) by mouth daily.  . hydrochlorothiazide (HYDRODIURIL) 25 MG tablet TAKE 1 TABLET BY MOUTH EVERY DAY  . Krill Oil 300 MG CAPS Take 300 mg by mouth daily.  . Multiple Vitamin (MULTIVITAMIN) tablet Take by mouth.  . Probiotic Product (PROBIOTIC ACIDOPHILUS BEADS) CAPS Take by mouth.  . tadalafil (CIALIS) 20 MG tablet Take 0.5-1 tablets (10-20 mg total) by mouth daily as needed for erectile dysfunction. Ok to fill early  . [DISCONTINUED] atorvastatin (LIPITOR) 40 MG tablet Take 1 tablet (40 mg total) by mouth daily.  . [DISCONTINUED] tadalafil (CIALIS) 10 MG tablet Take 1-2 tablets (10-20 mg total) by mouth daily as needed for erectile dysfunction. Ok to fill early    No Known Allergies     Review of Systems:  Constitutional: No recent illness  HEENT: No  headache, no vision change  Cardiac: No  chest pain, No  pressure, No palpitations  Respiratory:  No  shortness of breath. No  Cough  Gastrointestinal: No  abdominal pain  Musculoskeletal: No new myalgia/arthralgia  Neurologic: No  weakness, No  Dizziness  Psychiatric: No  concerns with depression, No  concerns with anxiety  Exam:  BP 131/78 (BP Location: Left Arm, Patient Position: Sitting, Cuff Size: Large)   Pulse 75   Temp 98.7 F (37.1 C) (Oral)   Wt 286 lb 11.2 oz (130 kg)   BMI 35.84 kg/m   Constitutional: VS see above. General Appearance: alert, well-developed, well-nourished, NAD  Eyes: Normal lids and conjunctive, non-icteric sclera  Ears, Nose, Mouth, Throat: MMM, Normal external inspection  ears/nares/mouth/lips/gums.  Neck: No masses, trachea midline.   Respiratory: Normal respiratory effort.   Musculoskeletal: Gait normal. Symmetric and independent movement of all extremities  Neurological: Normal balance/coordination. No tremor.  Skin: warm, dry, intact.   Psychiatric: Normal judgment/insight. Normal mood and affect. Oriented x3.       Visit summary with medication list and pertinent instructions was printed for patient to review, patient was advised to alert [place] if any updates are needed. All questions at time of visit were answered - patient instructed to contact office with any additional concerns. ER/RTC precautions were reviewed with the patient and understanding verbalized.     Please note: voice recognition software was used to produce this document, and typos may escape review. Please contact Dr. Sheppard Coil for any needed clarifications.    Follow up plan: Return in about 4 weeks (around 05/09/2019) for LAB VISIT ONLY to rechekc cholesterol. See me in 3 months foa annual physical .

## 2019-05-08 ENCOUNTER — Other Ambulatory Visit: Payer: Self-pay

## 2019-05-08 ENCOUNTER — Encounter: Payer: Self-pay | Admitting: Family Medicine

## 2019-05-08 ENCOUNTER — Emergency Department (INDEPENDENT_AMBULATORY_CARE_PROVIDER_SITE_OTHER)
Admission: EM | Admit: 2019-05-08 | Discharge: 2019-05-08 | Disposition: A | Discharge status: Home / Self Care | Payer: 59 | Source: Home / Self Care

## 2019-05-08 DIAGNOSIS — I889 Nonspecific lymphadenitis, unspecified: Secondary | ICD-10-CM | POA: Diagnosis not present

## 2019-05-08 MED ORDER — AMOXICILLIN 875 MG PO TABS: 875.0000 mg | ORAL_TABLET | Freq: Two times a day (BID) | ORAL | 0 refills | Status: DC

## 2019-05-08 NOTE — Discharge Instructions (Signed)
I am prescribing amoxicillin to clear up any infection and may be lingering in the lymph nodes of your neck.  There are several possible explanations for your tenderness in the neck including the mild infection in the lymph nodes.  You may have a thyroglossal duct cyst that is difficult to palpate and may only be seen with an ultrasound.  If you are not getting better I think you should see an ear nose and throat specialist.  I recommend that you start on vitamin D3 5000 international units daily which is the same as 125 mcg daily.  Also, to help prevent COVID-19, I would recommend vitamin C 500 mg twice a day.

## 2019-05-08 NOTE — ED Provider Notes (Signed)
[name]    CSN: [id number] Arrival date & time: 05/08/19  1603     History   Chief Complaint Chief Complaint  Patient presents with  . Adenopathy    HPI James Ware is a 58 y.o. male.   58 yo established [place] patient presenting with sore throat.  He had a sore throat with negative strep test 6 weeks ago as well.  Patient has mild hearing loss and TMJ.  Patient states that he has had intermittent fullness in his ears but the symptoms that he had back in June initially seemed to respond to Zyrtec and Flonase.  Nevertheless he is continued to have some soreness in the anterior cervical nodes of his neck.  He had thyroid testing done in July and was all normal.  He has had no fever or pharyngeal discomfort.  He has no odynophagia.  He is being treated for mixed hyperlipidemia, hypertension, prediabetes and erectile dysfunction.     Past Medical History:  Diagnosis Date  . Eczema 05/24/2017  . Essential hypertension 05/24/2017  . Prediabetes 05/24/2017  . Venous stasis dermatitis of both lower extremities 05/24/2017    Patient Active Problem List   Diagnosis Date Noted  . Erectile dysfunction 04/11/2019  . Hearing loss 12/16/2017  . Mixed hyperlipidemia 05/27/2017  . Decreased platelet count ([place]) 05/27/2017  . Vitamin D deficiency 05/27/2017  . Essential hypertension 05/24/2017  . Eczema 05/24/2017  . Venous stasis dermatitis of both lower extremities 05/24/2017  . Prediabetes 05/24/2017    History reviewed. No pertinent surgical history.     Home Medications    Prior to Admission medications   Medication Sig Start Date End Date Taking? Authorizing Provider  amoxicillin (AMOXIL) 875 MG tablet Take 1 tablet (875 mg total) by mouth 2 (two) times daily. 05/08/19   Robyn Haber, MD  atorvastatin (LIPITOR) 20 MG tablet Take 1 tablet (20 mg total) by mouth daily. 04/11/19   Emeterio Reeve, DO  hydrochlorothiazide (HYDRODIURIL) 25 MG tablet TAKE  1 TABLET BY MOUTH EVERY DAY 12/12/18   Emeterio Reeve, DO  Krill Oil 300 MG CAPS Take 300 mg by mouth daily.    [provider]  Multiple Vitamin (MULTIVITAMIN) tablet Take by mouth.    [provider]  Probiotic Product (PROBIOTIC ACIDOPHILUS BEADS) CAPS Take by mouth.    [provider]  tadalafil (CIALIS) 20 MG tablet Take 0.5-1 tablets (10-20 mg total) by mouth daily as needed for erectile dysfunction. Ok to fill early 04/11/19   Emeterio Reeve, DO    Family History Family History  Problem Relation Age of Onset  . Hyperlipidemia Mother   . Cancer Father        bladder    Social History Social History   Tobacco Use  . Smoking status: Former Research scientist (life sciences)  . Smokeless tobacco: Never Used  Substance Use Topics  . Alcohol use: Yes  . Drug use: Not on file     Allergies   Patient has no known allergies.   Review of Systems Review of Systems  Constitutional: Negative.   HENT: Positive for hearing loss. Negative for sore throat.   Respiratory: Negative.   Gastrointestinal: Negative.   Genitourinary: Negative.   Musculoskeletal: Negative.   Neurological: Negative.   All other systems reviewed and are negative.    Physical Exam Triage Vital Signs ED Triage Vitals  Enc Vitals Group     BP      Pulse      Resp  Temp      Temp src      SpO2      Weight      Height      Head Circumference      Peak Flow      Pain Score      Pain Loc      Pain Edu?      Excl. in [place]?    No data found.  Updated Vital Signs BP 133/77 (BP Location: Right Arm)   Pulse 80   Temp 98.4 F (36.9 C) (Oral)   Ht 6\' 3"  (1.905 m)   Wt 129.3 kg   SpO2 98%   BMI 35.62 kg/m    Physical Exam   UC Treatments / Results  Labs (all labs ordered are listed, but only abnormal results are displayed) Labs Reviewed - No data to display  EKG   Radiology No results found.  Procedures Procedures (including critical care time)  Medications Ordered in  UC Medications - No data to display  Initial Impression / Assessment and Plan / UC Course  I have reviewed the triage vital signs and the nursing notes.  Pertinent labs & imaging results that were available during my care of the patient were reviewed by me and considered in my medical decision making (see chart for details).    Final Clinical Impressions(s) / UC Diagnoses   Final diagnoses:  Cervical adenitis     Discharge Instructions     I am prescribing amoxicillin to clear up any infection and may be lingering in the lymph nodes of your neck.  There are several possible explanations for your tenderness in the neck including the mild infection in the lymph nodes.  You may have a thyroglossal duct cyst that is difficult to palpate and may only be seen with an ultrasound.  If you are not getting better I think you should see an ear nose and throat specialist.  I recommend that you start on vitamin D3 5000 international units daily which is the same as 125 mcg daily.  Also, to help prevent COVID-19, I would recommend vitamin C 500 mg twice a day.    ED Prescriptions    Medication Sig Dispense Auth. Provider   amoxicillin (AMOXIL) 875 MG tablet Take 1 tablet (875 mg total) by mouth 2 (two) times daily. 20 tablet Robyn Haber, MD     Controlled Substance Prescriptions [place] Controlled Substance Registry consulted? Not Applicable   Robyn Haber, MD 05/08/19 1640

## 2019-05-08 NOTE — ED Triage Notes (Signed)
Swollen lymph nodes in neck, thinks his thyroid might be enlarged

## 2019-05-22 DIAGNOSIS — J302 Other seasonal allergic rhinitis: Secondary | ICD-10-CM | POA: Insufficient documentation

## 2019-05-23 LAB — LIPID PANEL
Cholesterol: 137 mg/dL (ref <200)
HDL: 34 mg/dL — ABNORMAL LOW (ref >=40)
LDL Cholesterol (Calc): 76 mg/dL (calc)
Non-HDL Cholesterol (Calc): 103 mg/dL (calc) (ref <130)
Total CHOL/HDL Ratio: 4 (calc) (ref <5.0)
Triglycerides: 174 mg/dL — ABNORMAL HIGH (ref <150)

## 2019-06-02 ENCOUNTER — Other Ambulatory Visit: Payer: Self-pay | Admitting: Osteopathic Medicine

## 2019-06-02 DIAGNOSIS — I10 Essential (primary) hypertension: Secondary | ICD-10-CM

## 2019-06-02 NOTE — Telephone Encounter (Signed)
Forwarding medication refill request to PCP for review. 

## 2019-07-03 ENCOUNTER — Other Ambulatory Visit: Payer: Self-pay | Admitting: Osteopathic Medicine

## 2019-07-12 ENCOUNTER — Encounter: Payer: 59 | Admitting: Osteopathic Medicine

## 2019-07-26 ENCOUNTER — Other Ambulatory Visit: Payer: Self-pay | Admitting: Osteopathic Medicine

## 2019-07-26 NOTE — Telephone Encounter (Signed)
Requested medication (s) are due for refill today: no  Requested medication (s) are on the active medication list: no  Last refill:  01/16/2019  Future visit scheduled: yes  Notes to clinic:  Medication was discontinued    Requested Prescriptions  Pending Prescriptions Disp Refills   atorvastatin (LIPITOR) 40 MG tablet [Pharmacy Med Name: ATORVASTATIN 40 MG TABLET] 90 tablet 3    Sig: TAKE 1 TABLET BY MOUTH EVERY DAY     Cardiovascular:  Antilipid - Statins Failed - 07/26/2019  1:07 PM      Failed - HDL in normal range and within 360 days    HDL  Date Value Ref Range Status  05/22/2019 34 (L) > OR = 40 mg/dL Final         Failed - Triglycerides in normal range and within 360 days    Triglycerides  Date Value Ref Range Status  05/22/2019 174 (H) <150 mg/dL Final         Failed - Valid encounter within last 12 months    Recent Outpatient Visits          3 months ago Prediabetes   [facility], Lanelle Bal, DO   7 months ago Controlled type 2 diabetes mellitus with other specified complication, without long-term current use of insulin ([place])   [place], Lanelle Bal, DO   1 year ago Annual physical exam   [facility], Lanelle Bal, DO   1 year ago Prediabetes   [facility], Lanelle Bal, DO   1 year ago Prediabetes   [facility], Lanelle Bal, DO      Future Appointments            In 2 months Emeterio Reeve, DO [facility] - Total Cholesterol in normal range and within 360 days    Cholesterol  Date Value Ref Range Status  05/22/2019 137 <200 mg/dL Final         Passed - LDL in normal range and within 360 days    LDL Cholesterol (Calc)  Date Value Ref Range Status  05/22/2019 76 mg/dL (calc) Final   Comment:    Reference range: <100 . Desirable range <100 mg/dL for primary prevention;   <70 mg/dL for patients with CHD or diabetic patients  with > or = 2 CHD risk factors. Marland Kitchen LDL-C is now calculated using the Martin-Hopkins  calculation, which is a validated novel method providing  better accuracy than the Friedewald equation in the  estimation of LDL-C.  Cresenciano Genre et al. Annamaria Helling. MU:[id number]): 2061-2068  (http://education.[facility].com/faq/FAQ164)          Passed - Patient is not pregnant

## 2019-08-02 ENCOUNTER — Ambulatory Visit (INDEPENDENT_AMBULATORY_CARE_PROVIDER_SITE_OTHER): Payer: 59 | Admitting: Family Medicine

## 2019-08-02 ENCOUNTER — Other Ambulatory Visit: Payer: Self-pay

## 2019-08-02 ENCOUNTER — Encounter: Payer: 59 | Admitting: Osteopathic Medicine

## 2019-08-02 VITALS — BP 138/76 | HR 82 | Temp 98.2°F | Wt 296.0 lb

## 2019-08-02 DIAGNOSIS — Z23 Encounter for immunization: Secondary | ICD-10-CM | POA: Diagnosis not present

## 2019-08-02 NOTE — Progress Notes (Signed)
Agree with documentation as above.   Catherine Metheney, MD  

## 2019-08-02 NOTE — Progress Notes (Signed)
Pt in office today to get pneumovax 23 and he wishes to get also his shingles vaccine. Pt received both vaccines . No immediate complications noted. Pt advised to return in 2 months for next shingles vaccine.

## 2019-09-01 ENCOUNTER — Other Ambulatory Visit: Payer: Self-pay

## 2019-09-01 MED ORDER — ATORVASTATIN CALCIUM 20 MG PO TABS: 20.0000 mg | ORAL_TABLET | Freq: Every day | ORAL | 0 refills | Status: DC

## 2019-10-04 ENCOUNTER — Encounter: Payer: 59 | Admitting: Osteopathic Medicine

## 2019-10-13 ENCOUNTER — Encounter: Payer: 59 | Admitting: Osteopathic Medicine

## 2019-11-16 ENCOUNTER — Telehealth: Payer: Self-pay

## 2019-11-16 DIAGNOSIS — Z0000 Encounter for general adult medical examination without abnormal findings: Secondary | ICD-10-CM

## 2019-11-16 DIAGNOSIS — R7303 Prediabetes: Secondary | ICD-10-CM

## 2019-11-16 DIAGNOSIS — Z125 Encounter for screening for malignant neoplasm of prostate: Secondary | ICD-10-CM

## 2019-11-16 NOTE — Telephone Encounter (Signed)
Labs ordered.

## 2019-11-16 NOTE — Telephone Encounter (Signed)
Pt has been updated of lab order. As per pt, he will stop by tomorrow to complete fasting labs. No other inquiries during call.

## 2019-11-16 NOTE — Telephone Encounter (Signed)
Pt left a vm msg stating he will be stopping by [facility] tomorrow early morning for blood draw. No order placed in pt's chart for lab work. Pt mentioned he has an upcoming appt with provider on 11/21/19 for CPE/Annual check. Pls advise, thanks.

## 2019-11-21 ENCOUNTER — Encounter: Payer: Self-pay | Admitting: Osteopathic Medicine

## 2019-11-21 ENCOUNTER — Other Ambulatory Visit: Payer: Self-pay | Admitting: Osteopathic Medicine

## 2019-11-21 ENCOUNTER — Other Ambulatory Visit: Payer: Self-pay

## 2019-11-21 ENCOUNTER — Ambulatory Visit (INDEPENDENT_AMBULATORY_CARE_PROVIDER_SITE_OTHER): Payer: 59 | Admitting: Osteopathic Medicine

## 2019-11-21 VITALS — BP 114/83 | HR 82 | Temp 98.8°F | Ht 75.0 in | Wt 283.3 lb

## 2019-11-21 DIAGNOSIS — I10 Essential (primary) hypertension: Secondary | ICD-10-CM

## 2019-11-21 DIAGNOSIS — Z23 Encounter for immunization: Secondary | ICD-10-CM | POA: Diagnosis not present

## 2019-11-21 DIAGNOSIS — Z0000 Encounter for general adult medical examination without abnormal findings: Secondary | ICD-10-CM

## 2019-11-21 LAB — PSA, TOTAL WITH REFLEX TO PSA, FREE: PSA, Total: 0.4 ng/mL (ref <=4.0)

## 2019-11-21 LAB — COMPLETE METABOLIC PANEL WITH GFR
AG Ratio: 1.7 (calc) (ref 1.0–2.5)
ALT: 27 U/L (ref 9–46)
AST: 21 U/L (ref 10–35)
Albumin: 4.5 g/dL (ref 3.6–5.1)
Alkaline phosphatase (APISO): 70 U/L (ref 35–144)
BUN: 13 mg/dL (ref 7–25)
CO2: 26 mmol/L (ref 20–32)
Calcium: 9.5 mg/dL (ref 8.6–10.3)
Chloride: 103 mmol/L (ref 98–110)
Creat: 0.86 mg/dL (ref 0.70–1.25)
GFR, Est African American: 109 mL/min/{1.73_m2} (ref >=60)
GFR, Est Non African American: 94 mL/min/{1.73_m2} (ref >=60)
Globulin: 2.7 g/dL (calc) (ref 1.9–3.7)
Glucose, Bld: 100 mg/dL — ABNORMAL HIGH (ref 65–99)
Potassium: 3.7 mmol/L (ref 3.5–5.3)
Sodium: 141 mmol/L (ref 135–146)
Total Bilirubin: 1 mg/dL (ref 0.2–1.2)
Total Protein: 7.2 g/dL (ref 6.1–8.1)

## 2019-11-21 LAB — CBC
HCT: 46.7 % (ref 38.5–50.0)
Hemoglobin: 16 g/dL (ref 13.2–17.1)
MCH: 31.7 pg (ref 27.0–33.0)
MCHC: 34.3 g/dL (ref 32.0–36.0)
MCV: 92.7 fL (ref 80.0–100.0)
MPV: 11.2 fL (ref 7.5–12.5)
Platelets: 146 10*3/uL (ref 140–400)
RBC: 5.04 10*6/uL (ref 4.20–5.80)
RDW: 12.7 % (ref 11.0–15.0)
WBC: 6.8 10*3/uL (ref 3.8–10.8)

## 2019-11-21 LAB — LIPID PANEL
Cholesterol: 96 mg/dL (ref <200)
HDL: 32 mg/dL — ABNORMAL LOW (ref >=40)
LDL Cholesterol (Calc): 43 mg/dL (calc)
Non-HDL Cholesterol (Calc): 64 mg/dL (calc) (ref <130)
Total CHOL/HDL Ratio: 3 (calc) (ref <5.0)
Triglycerides: 129 mg/dL (ref <150)

## 2019-11-21 LAB — HEMOGLOBIN A1C
Hgb A1c MFr Bld: 5.8 % of total Hgb — ABNORMAL HIGH (ref <5.7)
Mean Plasma Glucose: 120 (calc)
eAG (mmol/L): 6.6 (calc)

## 2019-11-21 NOTE — Progress Notes (Signed)
HPI: James Ware is a 59 y.o. male who  has a past medical history of Eczema (05/24/2017), Essential hypertension (05/24/2017), Prediabetes (05/24/2017), and Venous stasis dermatitis of both lower extremities (05/24/2017).  he presents to [facility] today, 11/21/19,  for chief complaint of: Annual physical     Patient here for annual physical / wellness exam.  See preventive care reviewed as below.  Recent labs reviewed in detail with the patient.   Additional concerns today include:  None!       Past medical, surgical, social and family history reviewed:  Patient Active Problem List   Diagnosis Date Noted  . Erectile dysfunction 04/11/2019  . Hearing loss 12/16/2017  . Mixed hyperlipidemia 05/27/2017  . Decreased platelet count ([place]) 05/27/2017  . Vitamin D deficiency 05/27/2017  . Essential hypertension 05/24/2017  . Eczema 05/24/2017  . Venous stasis dermatitis of both lower extremities 05/24/2017  . Prediabetes 05/24/2017    No past surgical history on file.  Social History   Tobacco Use  . Smoking status: Former Research scientist (life sciences)  . Smokeless tobacco: Never Used  Substance Use Topics  . Alcohol use: Yes    Family History  Problem Relation Age of Onset  . Hyperlipidemia Mother   . Cancer Father        bladder     Current medication list and allergy/intolerance information reviewed:    Current Outpatient Medications  Medication Sig Dispense Refill  . atorvastatin (LIPITOR) 20 MG tablet Take 1 tablet (20 mg total) by mouth daily. 90 tablet 0  . hydrochlorothiazide (HYDRODIURIL) 25 MG tablet TAKE 1 TABLET BY MOUTH EVERY DAY 90 tablet 1  . Krill Oil 300 MG CAPS Take 300 mg by mouth daily.    . Multiple Vitamin (MULTIVITAMIN) tablet Take by mouth.    . Probiotic Product (PROBIOTIC ACIDOPHILUS BEADS) CAPS Take by mouth.    . tadalafil (CIALIS) 20 MG tablet Take 0.5-1 tablets (10-20 mg total) by mouth daily as needed for erectile  dysfunction. Ok to fill early 15 tablet 3   No current facility-administered medications for this visit.    No Known Allergies    Review of Systems:  Constitutional:  No  fever, no chills, No recent illness  HEENT: No  headache, no vision change, no hearing change  Cardiac: No  chest pain, No  pressure, No palpitations  Respiratory:  No  shortness of breath. No  Cough  Gastrointestinal: No  abdominal pain, No  nausea  Musculoskeletal: No new myalgia/arthralgia  Hem/Onc: No  easy bruising/bleeding, No  abnormal lymph node  Neurologic: No  weakness, No  dizziness  Psychiatric: No  concerns with depression, No  concerns with anxiety, No sleep problems, No mood problems  Exam:  BP 114/83   Pulse 82   Temp 98.8 F (37.1 C)   Ht 6\' 3"  (1.905 m)   Wt 283 lb 4.8 oz (128.5 kg)   SpO2 98%   BMI 35.41 kg/m   Constitutional: VS see above. General Appearance: alert, well-developed, well-nourished, NAD  Eyes: Normal lids and conjunctive, non-icteric sclera  Neck: No masses, trachea midline.   Respiratory: Normal respiratory effort. no wheeze, no rhonchi, no rales  Cardiovascular: S1/S2 normal, no murmur, no rub/gallop auscultated. RRR. No lower extremity edema.   Gastrointestinal: Nontender, no masses. No hepatomegaly, no splenomegaly. No hernia appreciated. Bowel sounds normal. Rectal exam deferred.   Musculoskeletal: Gait normal. No clubbing/cyanosis of digits.   Neurological: Normal balance/coordination. No tremor.   Skin:  warm, dry, intact. No rash/ulcer.   Psychiatric: Normal judgment/insight. Normal mood and affect. Oriented x3.          ASSESSMENT/PLAN: The primary encounter diagnosis was Annual physical exam. A diagnosis of Need for shingles vaccine was also pertinent to this visit.   Orders Placed This Encounter  Procedures  . Varicella-zoster vaccine IM (Shingrix)    No orders of the defined types were placed in this encounter.   Patient  Instructions  General Preventive Care  Most recent routine screening lipids/other labs: already done!   Cholesterol and A1C looking great!   Tobacco: don't!   Alcohol: responsible moderation is ok for most adults - if you have concerns about your alcohol intake, please talk to me!   Exercise: as tolerated to reduce risk of cardiovascular disease and diabetes. Strength training will also prevent osteoporosis.   Mental health: if need for mental health care (medicines, counseling, other), or concerns about moods, please let me know!   Sexual health: if need for STD testing, or if concerns with libido/pain problems, please let me know!   Advanced Directive: Living Will and/or Healthcare Power of Attorney recommended for all adults, regardless of age or health.  Vaccines  Flu vaccine: recommended for almost everyone, every fall.   Shingles vaccine: Shingrix all done!   Pneumonia vaccines: up to date. Repeat age 76.   Tetanus booster: Tdap recommended every 10 years. Due 04/2020  COVID: recommended as soon as you're eligible! Please follow [email or web address] for updates on eligibility and vaccination appointment. As of now, we do not anticipate our clinic having vaccines anytime soon.  Cancer screenings   Colon cancer screening: colonoscopy due 07/2027  Prostate cancer screening: PSA blood test age 2-75  Lung cancer screening: CT chest every year for those sge 62 to 61 years old with ?30 pack year smoking history, who either currently smoke or have quit within the past 15 years. Infection screenings . HIV: recommended screening at least once age 71-65, more often as needed. . Gonorrhea/Chlamydia: screening as needed . Hepatitis C: recommended for anyone born 39-1967 . TB: certain at-risk populations, or depending on work requirements and/or travel history Other . Bone Density Test: recommended for men at age 37 . Abdominal Aortic Aneurysm: screening with ultrasound  recommended once for men age 37-75 who have EVER smoked        Visit summary with medication list and pertinent instructions was printed for patient to review. All questions at time of visit were answered - patient instructed to contact office with any additional concerns or updates. ER/RTC precautions were reviewed with the patient.    Please note: voice recognition software was used to produce this document, and typos may escape review. Please contact Dr. Sheppard Coil for any needed clarifications.     Follow-up plan: Return in about 4 months (around 03/20/2020) for IN-OFFICE VISIT, FOLLOW-UP A1C .

## 2019-11-21 NOTE — Patient Instructions (Addendum)
General Preventive Care  Most recent routine screening lipids/other labs: already done!   Cholesterol and A1C looking great!   Tobacco: don't!   Alcohol: responsible moderation is ok for most adults - if you have concerns about your alcohol intake, please talk to me!   Exercise: as tolerated to reduce risk of cardiovascular disease and diabetes. Strength training will also prevent osteoporosis.   Mental health: if need for mental health care (medicines, counseling, other), or concerns about moods, please let me know!   Sexual health: if need for STD testing, or if concerns with libido/pain problems, please let me know!   Advanced Directive: Living Will and/or Healthcare Power of Attorney recommended for all adults, regardless of age or health.  Vaccines  Flu vaccine: recommended for almost everyone, every fall.   Shingles vaccine: Shingrix all done!   Pneumonia vaccines: up to date. Repeat age 60.   Tetanus booster: Tdap recommended every 10 years. Due 04/2020  COVID: recommended as soon as you're eligible! Please follow [email or web address] for updates on eligibility and vaccination appointment. As of now, we do not anticipate our clinic having vaccines anytime soon.  Cancer screenings   Colon cancer screening: colonoscopy due 07/2027  Prostate cancer screening: PSA blood test age 51-75  Lung cancer screening: CT chest every year for those sge 24 to 61 years old with ?30 pack year smoking history, who either currently smoke or have quit within the past 15 years. Infection screenings . HIV: recommended screening at least once age 57-65, more often as needed. . Gonorrhea/Chlamydia: screening as needed . Hepatitis C: recommended for anyone born 24-1967 . TB: certain at-risk populations, or depending on work requirements and/or travel history Other . Bone Density Test: recommended for men at age 16 . Abdominal Aortic Aneurysm: screening with ultrasound recommended once for men  age 55-75 who have EVER smoked

## 2019-12-19 ENCOUNTER — Other Ambulatory Visit: Payer: Self-pay | Admitting: Osteopathic Medicine

## 2020-01-15 ENCOUNTER — Ambulatory Visit (INDEPENDENT_AMBULATORY_CARE_PROVIDER_SITE_OTHER): Payer: 59

## 2020-01-15 ENCOUNTER — Encounter: Payer: Self-pay | Admitting: Podiatrist

## 2020-01-15 ENCOUNTER — Ambulatory Visit (INDEPENDENT_AMBULATORY_CARE_PROVIDER_SITE_OTHER): Payer: 59 | Admitting: Podiatrist

## 2020-01-15 ENCOUNTER — Other Ambulatory Visit: Payer: Self-pay

## 2020-01-15 VITALS — Temp 97.3°F

## 2020-01-15 DIAGNOSIS — R609 Edema, unspecified: Secondary | ICD-10-CM

## 2020-01-15 DIAGNOSIS — B351 Tinea unguium: Secondary | ICD-10-CM | POA: Diagnosis not present

## 2020-01-15 DIAGNOSIS — M21611 Bunion of right foot: Secondary | ICD-10-CM | POA: Diagnosis not present

## 2020-01-15 DIAGNOSIS — M21619 Bunion of unspecified foot: Secondary | ICD-10-CM

## 2020-01-15 DIAGNOSIS — M7751 Other enthesopathy of right foot: Secondary | ICD-10-CM

## 2020-01-15 NOTE — Progress Notes (Signed)
    Chief Complaint  Patient presents with  . Bunions    R foot, medial side.  Pt stated, "I've only noticed it for a couple of months. When I have pain, it's 5/10 - not debilitating. Some shoes are more comfortable than others".  . Foot Problem    L foot, medial side (arch). Pt stated, "I've had a muscle cramp for the past couple of weeks. It only happens early in the morning. When I take naproxen or ibuprofen, it resolves".  . Nail Problem    Bilateral hallux. Pt stated, "I was first diagnosed with nail fungus 15 yrs ago. I did a study at [facility] 7 years ago, and the treatment didn't help".     HPI: Patient is 59 y.o. male who presents today for the concerns as listed above.    Review of Systems No fevers, chills, nausea, muscle aches, no difficulty breathing, no calf pain, no chest pain or shortness of breath.   Physical Exam  GENERAL APPEARANCE: Alert, conversant. Appropriately groomed. No acute distress.   VASCULAR: Pedal pulses palpable DP and PT bilateral.  Capillary refill time is immediate to all digits,  Proximal to distal cooling it warm to warm.  Digital hair growth is present bilateral . Generalized swelling of b/l lower extremities noted.   NEUROLOGIC: sensation is intact epicritically and protectively to 5.07 monofilament at 5/5 sites bilateral.  Light touch is intact bilateral, vibratory sensation intact bilateral, achilles tendon reflex is intact bilateral.   MUSCULOSKELETAL: acceptable muscle strength, tone and stability bilateral.  Intrinsic muscluature intact bilateral.  Range of motion at ankle and first MPJ is normal bilateral.  Palpable bursae head of first metatarsal seen clinically and radiographically.    DERMATOLOGIC: skin is warm.  No open lesions noted.  No interdigital maceration noted bilateral. Dry and atrophic skin changes of plantar feet noted but not painful.  petechia present anterior shins from swelling.  Digital nails are yellowed and thickened  first and second bilateral feet.     Radiographic exam:  Normal osseous mineralization.  No fracture or dislocation or acute osseous abnormalities present.  Joint spaces are normal.  Bursae noted on the medial side of the first mpjoint.  Mild bunion deformity present.     Assessment     ICD-10-CM   1. Bunion  M21.619 DG Foot Complete Right  2. Bursitis of right foot  M77.51   3. Edema, unspecified type  R60.9   4. Onychomycosis  B35.1      Plan  Discussed etiology, pathology, conservative vs. Surgical therapies and at this time an injection was recommended.  The patient agreed and a sterile skin prep was applied.  An injection consisting of dexamethasone and marcaine mixture was infiltrated into the bursae of the right first metatarsal head.  The patient tolerated this well and was given instructions for aftercare. A rx for a nail treatment will be called into [facility]  rx for compression hose with a cut out for the digits is written for [facility].

## 2020-01-15 NOTE — Patient Instructions (Signed)
I have ordered a medication for you that will come from [facility] in [place]. They should be calling you to verify insurance and will mail the medication to you. If you live close by then you can go by their pharmacy to pick up the medication. Their phone number is [phone]. If you do not hear from them in the next few days, please give us a call at [phone].   

## 2020-01-16 ENCOUNTER — Telehealth: Payer: Self-pay | Admitting: *Deleted

## 2020-01-16 MED ORDER — NONFORMULARY OR COMPOUNDED ITEM: 3 refills | Status: AC

## 2020-01-16 NOTE — Telephone Encounter (Signed)
Dr. Valentina Lucks ordered [facility] Antifungal Topical. Faxed orders to [facility].

## 2020-03-05 ENCOUNTER — Inpatient Hospital Stay: Admission: RE | Admit: 2020-03-05 | Payer: 59 | Source: Ambulatory Visit

## 2020-03-06 ENCOUNTER — Encounter: Payer: Self-pay | Admitting: Family Medicine

## 2020-03-06 ENCOUNTER — Inpatient Hospital Stay
Admission: RE | Admit: 2020-03-06 | Discharge: 2020-03-06 | Disposition: A | Discharge status: Home / Self Care | Payer: 59 | Source: Ambulatory Visit

## 2020-03-06 NOTE — Progress Notes (Signed)
Subjective:    CC: possible UTI  HPI: Very pleasant 59 year old male presenting today with urinary symptoms x1 week. Endorses urinary burning, frequency, retention and suprapubic pressure.  No difficulty with starting urinary stream but notes that the stream has been weaker than normal.  Admits that symptoms do seem to come and go as a couple of nights he is awakened and had no difficulty with urination and no symptoms.  Denies blood in urine, urinary hesitancy, fever, chills, and nausea.  Triad Azo OTC with minimal relief.  He has been drinking cranberry juice but endorses that his water intake has been decreased over the last 2 weeks.  His son is in town and his regular schedule has gotten offkilter so he has been drinking less.  History of urethral stricture 20 years ago but no regular urinary problems since then.  I reviewed the past medical history, family history, social history, surgical history, and allergies today and no changes were needed.  Please see the problem list section below in epic for further details.  Past Medical History: Past Medical History:  Diagnosis Date  . Eczema 05/24/2017  . Essential hypertension 05/24/2017  . Prediabetes 05/24/2017  . Venous stasis dermatitis of both lower extremities 05/24/2017   Past Surgical History: History reviewed. No pertinent surgical history. Social History: Social History   Socioeconomic History  . Marital status: Divorced    Spouse name: Not on file  . Number of children: Not on file  . Years of education: Not on file  . Highest education level: Not on file  Occupational History  . Not on file  Tobacco Use  . Smoking status: Former Research scientist (life sciences)  . Smokeless tobacco: Never Used  Vaping Use  . Vaping Use: Never used  Substance and Sexual Activity  . Alcohol use: Yes  . Drug use: Not on file  . Sexual activity: Not on file  Other Topics Concern  . Not on file  Social History Narrative  . Not on file   Social Determinants of  Health   Financial Resource Strain:   . Difficulty of Paying Living Expenses:   Food Insecurity:   . Worried About Charity fundraiser in the Last Year:   . Arboriculturist in the Last Year:   Transportation Needs:   . Film/video editor (Medical):   Marland Kitchen Lack of Transportation (Non-Medical):   Physical Activity:   . Days of Exercise per Week:   . Minutes of Exercise per Session:   Stress:   . Feeling of Stress :   Social Connections:   . Frequency of Communication with Friends and Family:   . Frequency of Social Gatherings with Friends and Family:   . Attends Religious Services:   . Active Member of Clubs or Organizations:   . Attends Archivist Meetings:   Marland Kitchen Marital Status:    Family History: Family History  Problem Relation Age of Onset  . Hyperlipidemia Mother   . Cancer Mother   . Cancer Father        bladder   Allergies: No Known Allergies Medications: See med rec.  Review of Systems: See HPI for pertinent positives and negatives.   Objective:    General: Well Developed, well nourished, and in no acute distress.  Neuro: Alert and oriented x3.  HEENT: Normocephalic, atraumatic. Skin: Warm and dry. Cardiac: Regular rate and rhythm, no murmurs rubs or gallops, no lower extremity edema.  Respiratory: Clear to auscultation bilaterally. Not using accessory  muscles, speaking in full sentences. Abdomen:  Impression and Recommendations:    1. Dysuria POCT UA positive for trace leukocytes, negative for blood, nitrites, and protein.  We will send for culture.  Consider UTI versus prostatitis.  Due to the presence of leukocytes we will go ahead and start nitrofurantoin twice daily x7 days.  Advised patient to push p.o. fluids, focusing on water. - POCT URINALYSIS DIP (CLINITEK) - Urine Culture  2. Prediabetes Overdue for urine microalbumin.  Since we have a sample on hand today we will go ahead and run this to get him up-to-date. - POCT UA -  Microalbumin  Return if symptoms worsen or fail to improve. ___________________________________________ Clearnce Sorrel, DNP, APRN, FNP-BC [facility] [place]

## 2020-03-07 ENCOUNTER — Other Ambulatory Visit: Payer: Self-pay

## 2020-03-07 ENCOUNTER — Ambulatory Visit (INDEPENDENT_AMBULATORY_CARE_PROVIDER_SITE_OTHER): Payer: 59 | Admitting: Medical-Surgical

## 2020-03-07 ENCOUNTER — Encounter: Payer: Self-pay | Admitting: Medical-Surgical

## 2020-03-07 ENCOUNTER — Encounter: Payer: Self-pay | Admitting: Osteopathic Medicine

## 2020-03-07 VITALS — BP 132/83 | HR 91 | Temp 98.3°F | Ht 75.0 in | Wt 294.4 lb

## 2020-03-07 DIAGNOSIS — R7303 Prediabetes: Secondary | ICD-10-CM | POA: Diagnosis not present

## 2020-03-07 DIAGNOSIS — R300 Dysuria: Secondary | ICD-10-CM

## 2020-03-07 LAB — POCT URINALYSIS DIP (CLINITEK)
Bilirubin, UA: NEGATIVE
Blood, UA: NEGATIVE
Glucose, UA: NEGATIVE mg/dL
Ketones, POC UA: NEGATIVE mg/dL
Nitrite, UA: NEGATIVE
POC PROTEIN,UA: NEGATIVE
Spec Grav, UA: >=1.03 — AB (ref 1.010–1.025)
Urobilinogen, UA: 1 E.U./dL
pH, UA: 5 (ref 5.0–8.0)

## 2020-03-07 LAB — POCT UA - MICROALBUMIN
Albumin/Creatinine Ratio, Urine, POC: <30
Creatinine, POC: 200 mg/dL
Microalbumin Ur, POC: 10 mg/L

## 2020-03-07 MED ORDER — NITROFURANTOIN MONOHYD MACRO 100 MG PO CAPS: 100.0000 mg | ORAL_CAPSULE | Freq: Two times a day (BID) | ORAL | 0 refills | Status: DC

## 2020-03-08 LAB — URINE CULTURE
MICRO NUMBER:: 10577222
Result:: NO GROWTH
SPECIMEN QUALITY:: ADEQUATE

## 2020-03-11 ENCOUNTER — Encounter: Payer: Self-pay | Admitting: Osteopathic Medicine

## 2020-03-14 ENCOUNTER — Encounter: Payer: Self-pay | Admitting: Medical-Surgical

## 2020-03-20 ENCOUNTER — Ambulatory Visit: Payer: 59 | Admitting: Osteopathic Medicine

## 2020-04-09 ENCOUNTER — Ambulatory Visit: Payer: 59 | Admitting: Osteopathic Medicine

## 2020-04-10 ENCOUNTER — Other Ambulatory Visit: Payer: Self-pay | Admitting: Osteopathic Medicine

## 2020-04-10 DIAGNOSIS — R7303 Prediabetes: Secondary | ICD-10-CM

## 2020-04-12 LAB — HEMOGLOBIN A1C
Hgb A1c MFr Bld: 6.2 % of total Hgb — ABNORMAL HIGH (ref <5.7)
Mean Plasma Glucose: 131 (calc)
eAG (mmol/L): 7.3 (calc)

## 2020-04-15 NOTE — Progress Notes (Signed)
A1c increased to 6.2%. You are still in the pre-diabetes category.   Please monitor your carbohydrate and sugar intake and increase your physical activity to help offset the increase in your blood sugar.

## 2020-04-16 ENCOUNTER — Ambulatory Visit (INDEPENDENT_AMBULATORY_CARE_PROVIDER_SITE_OTHER): Payer: 59 | Admitting: Osteopathic Medicine

## 2020-04-16 ENCOUNTER — Other Ambulatory Visit: Payer: Self-pay

## 2020-04-16 ENCOUNTER — Encounter: Payer: Self-pay | Admitting: Osteopathic Medicine

## 2020-04-16 VITALS — BP 132/75 | HR 79 | Temp 98.0°F | Wt 290.0 lb

## 2020-04-16 DIAGNOSIS — E1169 Type 2 diabetes mellitus with other specified complication: Secondary | ICD-10-CM | POA: Diagnosis not present

## 2020-04-16 DIAGNOSIS — H029 Unspecified disorder of eyelid: Secondary | ICD-10-CM | POA: Diagnosis not present

## 2020-04-16 LAB — POCT GLYCOSYLATED HEMOGLOBIN (HGB A1C): Hemoglobin A1C: 4.8 % (ref 4.0–5.6)

## 2020-04-16 NOTE — Patient Instructions (Addendum)
Would call [facility] to confirm colonoscopy follow up [phone] should be 07/2021?

## 2020-04-16 NOTE — Progress Notes (Signed)
James Ware is a 59 y.o. male who presents to  [place] [facility]  today, 04/16/20, seeking care for the following:  . Prediabetes follow-up - POC A1C today 4.8 but blood draw last week was 6.2. Weight fluctuating, diet doing mostly ok but socially eating and stress eating.      ASSESSMENT & PLAN with other pertinent findings:  The primary encounter diagnosis was Eyelid lesion. A diagnosis of Controlled type 2 diabetes mellitus with other specified complication, without long-term current use of insulin (HCC) was also pertinent to this visit.   Prediabetic range - looking great, pt would still ike to avoid metformin or other Rx if possible, fine w/ me!    Results for orders placed or performed in visit on 04/16/20 (from the past 24 hour(s))  POCT HgB A1C     Status: Normal   Collection Time: 04/16/20  7:25 AM  Result Value Ref Range   Hemoglobin A1C 4.8 4.0 - 5.6 %   HbA1c POC (<> result, manual entry)     HbA1c, POC (prediabetic range)     HbA1c, POC (controlled diabetic range)     Wt Readings from Last 3 Encounters:  04/16/20 290 lb (131.5 kg)  03/07/20 294 lb 6.4 oz (133.5 kg)  11/21/19 283 lb 4.8 oz (128.5 kg)       Patient Instructions  Would call [facility] to confirm colonoscopy follow up [phone] should be 07/2021?        Orders Placed This Encounter  Procedures  . POCT HgB A1C    No orders of the defined types were placed in this encounter.      Follow-up instructions: Return in about 6 months (around 10/17/2020) for recheck A1C - see me sooner if needed! .                                         BP 132/75 (BP Location: Left Arm, Patient Position: Sitting)   Pulse 79   Temp 98 F (36.7 C)   Wt 290 lb (131.5 kg)   SpO2 99%   BMI 36.25 kg/m   Current Meds  Medication Sig  . atorvastatin (LIPITOR) 20 MG tablet TAKE 1 TABLET BY MOUTH  EVERY DAY  . hydrochlorothiazide (HYDRODIURIL) 25 MG tablet TAKE 1 TABLET BY MOUTH EVERY DAY  . Krill Oil 300 MG CAPS Take 300 mg by mouth daily.  . Multiple Vitamin (MULTIVITAMIN) tablet Take by mouth.  . NONFORMULARY OR COMPOUNDED ITEM [facility]:  Antifungal Topical - Terbinafine 3%, Fluconazole 2%, Tea Tree Oil 10%, Ibuprofen 2%, in DMSO Suspension #78ml. Apply to affected toenail(s) once (at bedtime) or twice daily.  . Probiotic Product (PROBIOTIC ACIDOPHILUS BEADS) CAPS Take by mouth.  . tadalafil (CIALIS) 20 MG tablet Take 0.5-1 tablets (10-20 mg total) by mouth daily as needed for erectile dysfunction. Ok to fill early    Results for orders placed or performed in visit on 04/16/20 (from the past 72 hour(s))  POCT HgB A1C     Status: Normal   Collection Time: 04/16/20  7:25 AM  Result Value Ref Range   Hemoglobin A1C 4.8 4.0 - 5.6 %   HbA1c POC (<> result, manual entry)     HbA1c, POC (prediabetic range)     HbA1c, POC (controlled diabetic range)      No results found.  All questions at time of visit were answered - patient instructed to contact office with any additional concerns or updates.  ER/RTC precautions were reviewed with the patient as applicable.   Please note: voice recognition software was used to produce this document, and typos may escape review. Please contact Dr. Sheppard Coil for any needed clarifications.

## 2020-05-12 ENCOUNTER — Other Ambulatory Visit: Payer: Self-pay | Admitting: Osteopathic Medicine

## 2020-05-12 DIAGNOSIS — I10 Essential (primary) hypertension: Secondary | ICD-10-CM

## 2020-05-15 ENCOUNTER — Other Ambulatory Visit: Payer: Self-pay | Admitting: Osteopathic Medicine

## 2020-05-26 ENCOUNTER — Other Ambulatory Visit: Payer: Self-pay | Admitting: Osteopathic Medicine

## 2020-07-09 ENCOUNTER — Encounter: Payer: Self-pay | Admitting: Osteopathic Medicine

## 2020-08-21 ENCOUNTER — Other Ambulatory Visit: Payer: Self-pay | Admitting: Osteopathic Medicine

## 2020-10-07 ENCOUNTER — Telehealth: Payer: Self-pay | Admitting: Osteopathic Medicine

## 2020-10-07 ENCOUNTER — Encounter: Payer: Self-pay | Admitting: Osteopathic Medicine

## 2020-10-07 DIAGNOSIS — E782 Mixed hyperlipidemia: Secondary | ICD-10-CM

## 2020-10-07 DIAGNOSIS — E1169 Type 2 diabetes mellitus with other specified complication: Secondary | ICD-10-CM

## 2020-10-07 DIAGNOSIS — I10 Essential (primary) hypertension: Secondary | ICD-10-CM

## 2020-10-07 DIAGNOSIS — R7303 Prediabetes: Secondary | ICD-10-CM

## 2020-10-07 NOTE — Telephone Encounter (Signed)
Labs pre-ordered for pt. Does provider need to add additional testing?

## 2020-10-07 NOTE — Telephone Encounter (Signed)
Patient called and stated he is going to come in to the lab on Friday to have blood work drawn before seeing Dr. Sheppard Coil on Tuesday 1/18. Please have labs in system so he can stop by and have them drawn - CF

## 2020-10-09 NOTE — Telephone Encounter (Signed)
Task completed. Left a detailed vm msg for pt that lab order is available. Aware to complete current order fasting. Direct call back info provided.

## 2020-10-11 LAB — MICROALBUMIN / CREATININE URINE RATIO
Creatinine, Urine: 116 mg/dL (ref 20–320)
Microalb Creat Ratio: 4 mcg/mg creat (ref <30)
Microalb, Ur: 0.5 mg/dL

## 2020-10-11 LAB — COMPLETE METABOLIC PANEL WITH GFR
AG Ratio: 1.6 (calc) (ref 1.0–2.5)
ALT: 33 U/L (ref 9–46)
AST: 27 U/L (ref 10–35)
Albumin: 4.5 g/dL (ref 3.6–5.1)
Alkaline phosphatase (APISO): 82 U/L (ref 35–144)
BUN: 15 mg/dL (ref 7–25)
CO2: 25 mmol/L (ref 20–32)
Calcium: 9.9 mg/dL (ref 8.6–10.3)
Chloride: 102 mmol/L (ref 98–110)
Creat: 0.84 mg/dL (ref 0.70–1.25)
GFR, Est African American: 110 mL/min/{1.73_m2} (ref >=60)
GFR, Est Non African American: 95 mL/min/{1.73_m2} (ref >=60)
Globulin: 2.9 g/dL (calc) (ref 1.9–3.7)
Glucose, Bld: 119 mg/dL — ABNORMAL HIGH (ref 65–99)
Potassium: 4.1 mmol/L (ref 3.5–5.3)
Sodium: 141 mmol/L (ref 135–146)
Total Bilirubin: 1.3 mg/dL — ABNORMAL HIGH (ref 0.2–1.2)
Total Protein: 7.4 g/dL (ref 6.1–8.1)

## 2020-10-11 LAB — CBC WITH DIFFERENTIAL/PLATELET
Absolute Monocytes: 522 cells/uL (ref 200–950)
Basophils Absolute: 78 cells/uL (ref 0–200)
Basophils Relative: 0.9 %
Eosinophils Absolute: 183 cells/uL (ref 15–500)
Eosinophils Relative: 2.1 %
HCT: 50.9 % — ABNORMAL HIGH (ref 38.5–50.0)
Hemoglobin: 17.1 g/dL (ref 13.2–17.1)
Lymphs Abs: 2323 cells/uL (ref 850–3900)
MCH: 31.3 pg (ref 27.0–33.0)
MCHC: 33.6 g/dL (ref 32.0–36.0)
MCV: 93.1 fL (ref 80.0–100.0)
MPV: 11.3 fL (ref 7.5–12.5)
Monocytes Relative: 6 %
Neutro Abs: 5594 cells/uL (ref 1500–7800)
Neutrophils Relative %: 64.3 %
Platelets: 137 10*3/uL — ABNORMAL LOW (ref 140–400)
RBC: 5.47 10*6/uL (ref 4.20–5.80)
RDW: 12.5 % (ref 11.0–15.0)
Total Lymphocyte: 26.7 %
WBC: 8.7 10*3/uL (ref 3.8–10.8)

## 2020-10-11 LAB — LIPID PANEL W/REFLEX DIRECT LDL
Cholesterol: 123 mg/dL (ref <200)
HDL: 34 mg/dL — ABNORMAL LOW (ref >=40)
LDL Cholesterol (Calc): 64 mg/dL (calc)
Non-HDL Cholesterol (Calc): 89 mg/dL (calc) (ref <130)
Total CHOL/HDL Ratio: 3.6 (calc) (ref <5.0)
Triglycerides: 176 mg/dL — ABNORMAL HIGH (ref <150)

## 2020-10-11 LAB — HEMOGLOBIN A1C
Hgb A1c MFr Bld: 6.5 % of total Hgb — ABNORMAL HIGH (ref <5.7)
Mean Plasma Glucose: 140 mg/dL
eAG (mmol/L): 7.7 mmol/L

## 2020-10-11 LAB — PSA, TOTAL WITH REFLEX TO PSA, FREE: PSA, Total: 0.5 ng/mL (ref <=4.0)

## 2020-10-15 ENCOUNTER — Ambulatory Visit: Payer: 59 | Admitting: Osteopathic Medicine

## 2020-10-23 ENCOUNTER — Ambulatory Visit: Payer: 59 | Admitting: Osteopathic Medicine

## 2020-10-31 ENCOUNTER — Encounter: Payer: Self-pay | Admitting: Osteopathic Medicine

## 2020-10-31 ENCOUNTER — Other Ambulatory Visit: Payer: Self-pay

## 2020-10-31 ENCOUNTER — Ambulatory Visit (INDEPENDENT_AMBULATORY_CARE_PROVIDER_SITE_OTHER): Payer: 59 | Admitting: Osteopathic Medicine

## 2020-10-31 VITALS — BP 128/86 | HR 78 | Temp 97.8°F | Wt 287.0 lb

## 2020-10-31 DIAGNOSIS — E782 Mixed hyperlipidemia: Secondary | ICD-10-CM | POA: Diagnosis not present

## 2020-10-31 DIAGNOSIS — Z23 Encounter for immunization: Secondary | ICD-10-CM

## 2020-10-31 DIAGNOSIS — I10 Essential (primary) hypertension: Secondary | ICD-10-CM | POA: Diagnosis not present

## 2020-10-31 DIAGNOSIS — E1169 Type 2 diabetes mellitus with other specified complication: Secondary | ICD-10-CM

## 2020-10-31 MED ORDER — ROSUVASTATIN CALCIUM 10 MG PO TABS: 10.0000 mg | ORAL_TABLET | Freq: Every day | ORAL | 3 refills | Status: DC

## 2020-10-31 NOTE — Progress Notes (Signed)
James Ware is a 60 y.o. male who presents to  [place] at [facility]  today, 10/31/20, seeking care for the following:  . Following up labs: DM2, HLD, ED.  Marland Kitchen Requests change to Crestor, has read a study re: this has less issue w/ ED compared to Lipitor.  . Otherwise doing well, working on diet/exercise     ASSESSMENT & PLAN with other pertinent findings:  The primary encounter diagnosis was Controlled type 2 diabetes mellitus with other specified complication, without long-term current use of insulin ([place]). Diagnoses of Essential hypertension, Need for Tdap vaccination, and Mixed hyperlipidemia were also pertinent to this visit.   No results found for this or any previous visit (from the past 24 hour(s)).  Change lipitor to crestor  Recheck labs in 3-4 mos    Patient Instructions    Orders Placed This Encounter  Procedures  . Tdap vaccine greater than or equal to 7yo IM  . Lipid panel  . COMPLETE METABOLIC PANEL WITH GFR  . Hemoglobin A1c  . CBC    Meds ordered this encounter  Medications  . rosuvastatin (CRESTOR) 10 MG tablet    Sig: Take 1 tablet (10 mg total) by mouth daily.    Dispense:  90 tablet    Refill:  3       Follow-up instructions: Return in about 3 months (around 01/28/2021) for FOLLOW UP ON LABS, A1C, NEW CHOLESTEROL RX .                                         BP 128/86 (BP Location: Left Arm, Patient Position: Sitting, Cuff Size: Large)   Pulse 78   Temp 97.8 F (36.6 C) (Oral)   Wt 287 lb (130.2 kg)   BMI 35.87 kg/m   Current Meds  Medication Sig  . Ascorbic Acid (VITAMIN C) 1000 MG tablet Take 1,000 mg by mouth daily.  . Cholecalciferol (VITAMIN D-3) 125 MCG (5000 UT) TABS Take by mouth.  . hydrochlorothiazide (HYDRODIURIL) 25 MG tablet TAKE 1 TABLET BY MOUTH EVERY DAY  . Krill Oil 300 MG CAPS Take 300 mg by mouth daily.  . Multiple Vitamin  (MULTIVITAMIN) tablet Take by mouth.  . NONFORMULARY OR COMPOUNDED ITEM [facility]:  Antifungal Topical - Terbinafine 3%, Fluconazole 2%, Tea Tree Oil 10%, Ibuprofen 2%, in DMSO Suspension #59ml. Apply to affected toenail(s) once (at bedtime) or twice daily.  . Probiotic Product (PROBIOTIC ACIDOPHILUS BEADS) CAPS Take by mouth.  . rosuvastatin (CRESTOR) 10 MG tablet Take 1 tablet (10 mg total) by mouth daily.  . tadalafil (CIALIS) 20 MG tablet TAKE 1/2 TO 1 TABLET BY MOUTH AS NEEDED FOR ED  . [DISCONTINUED] atorvastatin (LIPITOR) 20 MG tablet TAKE 1 TABLET BY MOUTH EVERY DAY    No results found for this or any previous visit (from the past 72 hour(s)).  No results found.     All questions at time of visit were answered - patient instructed to contact office with any additional concerns or updates.  ER/RTC precautions were reviewed with the patient as applicable.   Please note: voice recognition software was used to produce this document, and typos may escape review. Please contact Dr. Sheppard Coil for any needed clarifications.

## 2020-11-08 ENCOUNTER — Other Ambulatory Visit: Payer: Self-pay | Admitting: Osteopathic Medicine

## 2020-11-08 DIAGNOSIS — I10 Essential (primary) hypertension: Secondary | ICD-10-CM

## 2020-11-19 ENCOUNTER — Other Ambulatory Visit: Payer: Self-pay | Admitting: Osteopathic Medicine

## 2021-01-30 ENCOUNTER — Ambulatory Visit: Payer: 59 | Admitting: Osteopathic Medicine

## 2021-01-31 ENCOUNTER — Ambulatory Visit: Payer: 59 | Admitting: Osteopathic Medicine

## 2021-02-12 LAB — CBC
HCT: 49.3 % (ref 38.5–50.0)
Hemoglobin: 16.7 g/dL (ref 13.2–17.1)
MCH: 30.8 pg (ref 27.0–33.0)
MCHC: 33.9 g/dL (ref 32.0–36.0)
MCV: 90.8 fL (ref 80.0–100.0)
MPV: 10.6 fL (ref 7.5–12.5)
Platelets: 166 10*3/uL (ref 140–400)
RBC: 5.43 10*6/uL (ref 4.20–5.80)
RDW: 12.6 % (ref 11.0–15.0)
WBC: 8.3 10*3/uL (ref 3.8–10.8)

## 2021-02-12 LAB — LIPID PANEL
Cholesterol: 132 mg/dL (ref <200)
HDL: 35 mg/dL — ABNORMAL LOW (ref >=40)
LDL Cholesterol (Calc): 69 mg/dL (calc)
Non-HDL Cholesterol (Calc): 97 mg/dL (calc) (ref <130)
Total CHOL/HDL Ratio: 3.8 (calc) (ref <5.0)
Triglycerides: 222 mg/dL — ABNORMAL HIGH (ref <150)

## 2021-02-12 LAB — COMPLETE METABOLIC PANEL WITH GFR
AG Ratio: 1.5 (calc) (ref 1.0–2.5)
ALT: 34 U/L (ref 9–46)
AST: 29 U/L (ref 10–35)
Albumin: 4.6 g/dL (ref 3.6–5.1)
Alkaline phosphatase (APISO): 73 U/L (ref 35–144)
BUN: 16 mg/dL (ref 7–25)
CO2: 29 mmol/L (ref 20–32)
Calcium: 10 mg/dL (ref 8.6–10.3)
Chloride: 101 mmol/L (ref 98–110)
Creat: 0.88 mg/dL (ref 0.70–1.25)
GFR, Est African American: 107 mL/min/{1.73_m2} (ref >=60)
GFR, Est Non African American: 93 mL/min/{1.73_m2} (ref >=60)
Globulin: 3.1 g/dL (calc) (ref 1.9–3.7)
Glucose, Bld: 121 mg/dL — ABNORMAL HIGH (ref 65–99)
Potassium: 3.6 mmol/L (ref 3.5–5.3)
Sodium: 141 mmol/L (ref 135–146)
Total Bilirubin: 1 mg/dL (ref 0.2–1.2)
Total Protein: 7.7 g/dL (ref 6.1–8.1)

## 2021-02-12 LAB — HEMOGLOBIN A1C
Hgb A1c MFr Bld: 6.8 % of total Hgb — ABNORMAL HIGH (ref <5.7)
Mean Plasma Glucose: 148 mg/dL
eAG (mmol/L): 8.2 mmol/L

## 2021-02-13 ENCOUNTER — Encounter: Payer: Self-pay | Admitting: Osteopathic Medicine

## 2021-02-13 ENCOUNTER — Ambulatory Visit (INDEPENDENT_AMBULATORY_CARE_PROVIDER_SITE_OTHER): Payer: 59 | Admitting: Osteopathic Medicine

## 2021-02-13 ENCOUNTER — Other Ambulatory Visit: Payer: Self-pay

## 2021-02-13 VITALS — BP 147/85 | HR 89 | Temp 98.7°F | Wt 289.1 lb

## 2021-02-13 DIAGNOSIS — E785 Hyperlipidemia, unspecified: Secondary | ICD-10-CM | POA: Diagnosis not present

## 2021-02-13 DIAGNOSIS — E1159 Type 2 diabetes mellitus with other circulatory complications: Secondary | ICD-10-CM

## 2021-02-13 DIAGNOSIS — L578 Other skin changes due to chronic exposure to nonionizing radiation: Secondary | ICD-10-CM | POA: Diagnosis not present

## 2021-02-13 DIAGNOSIS — I152 Hypertension secondary to endocrine disorders: Secondary | ICD-10-CM

## 2021-02-13 DIAGNOSIS — E119 Type 2 diabetes mellitus without complications: Secondary | ICD-10-CM | POA: Insufficient documentation

## 2021-02-13 DIAGNOSIS — E1169 Type 2 diabetes mellitus with other specified complication: Secondary | ICD-10-CM | POA: Diagnosis not present

## 2021-02-13 NOTE — Progress Notes (Signed)
James Ware is a 61 y.o. male who presents to  [place] at [facility]  today, 02/13/21, seeking care for the following:  . Follow up DM2 . Skin check: scalp     ASSESSMENT & PLAN with other pertinent findings:  The primary encounter diagnosis was Controlled type 2 diabetes mellitus with other specified complication, without long-term current use of insulin ([place]). Diagnoses of Hyperlipidemia associated with type 2 diabetes mellitus ([place]), Hypertension associated with type 2 diabetes mellitus ([place]), and Actinic skin damage - scalp and forehead, cryotherapy applied 02/13/21 were also pertinent to this visit.   1. Controlled type 2 diabetes mellitus with other specified complication, without long-term current use of insulin ([place]) Doing fairly well w/ this  Labs reviewed in detail, see below No medication changes at this time Discussed diet/exercise modifications   2. Hyperlipidemia associated with type 2 diabetes mellitus ([place]) Work on diet, TG up!   3. Hypertension associated with type 2 diabetes mellitus ([facility]) BP Readings from Last 3 Encounters:  02/13/21 (!) 147/85  10/31/20 128/86  04/16/20 132/75  Not at goal today If still above goal next check would add ACE/ARB  4. Actinic skin damage - scalp and forehead, cryotherapy applied 02/13/21 Low threshold for shave bx or aldara, possible derm referral   There are no Patient Instructions on file for this visit.  No orders of the defined types were placed in this encounter.   No orders of the defined types were placed in this encounter.    See below for relevant physical exam findings  See below for recent lab and imaging results reviewed  Medications, allergies, PMH, PSH, SocH, [place] reviewed below    Follow-up instructions: Return in about 6 months (around 08/16/2021) for [place] (can call week prior to visit for lab  orders).                                        Exam:  BP (!) 147/85 (BP Location: Left Arm, Patient Position: Sitting, Cuff Size: Large)   Pulse 89   Temp 98.7 F (37.1 C) (Oral)   Wt 289 lb 1.9 oz (131.1 kg)   BMI 36.14 kg/m   Constitutional: VS see above. General Appearance: alert, well-developed, well-nourished, NAD  Neck: No masses, trachea midline.   Respiratory: Normal respiratory effort. no wheeze, no rhonchi, no rales  Cardiovascular: S1/S2 normal, no murmur, no rub/gallop auscultated. RRR.   Musculoskeletal: Gait normal. Symmetric and independent movement of all extremities  Abdominal: non-tender, non-distended, no appreciable organomegaly, neg Murphy's, BS WNLx4  Neurological: Normal balance/coordination. No tremor.  Skin: warm, dry, intact. Actinic change on small area of scalp at crown and smalla rea above R eyebrow   Psychiatric: Normal judgment/insight. Normal mood and affect. Oriented x3.   Current Meds  Medication Sig  . Ascorbic Acid (VITAMIN C) 1000 MG tablet Take 1,000 mg by mouth daily.  . Cholecalciferol (VITAMIN D-3) 125 MCG (5000 UT) TABS Take by mouth.  . hydrochlorothiazide (HYDRODIURIL) 25 MG tablet TAKE 1 TABLET BY MOUTH EVERY DAY  . Krill Oil 300 MG CAPS Take 300 mg by mouth daily.  . Multiple Vitamin (MULTIVITAMIN) tablet Take by mouth.  . NONFORMULARY OR COMPOUNDED ITEM [facility]:  Antifungal Topical - Terbinafine 3%, Fluconazole 2%, Tea Tree Oil 10%, Ibuprofen 2%, in DMSO Suspension #51ml. Apply to affected toenail(s) once (at bedtime) or  twice daily.  . Probiotic Product (PROBIOTIC ACIDOPHILUS BEADS) CAPS Take by mouth.  . rosuvastatin (CRESTOR) 10 MG tablet Take 1 tablet (10 mg total) by mouth daily.  . tadalafil (CIALIS) 20 MG tablet TAKE 1/2 TO 1 TABLET BY MOUTH AS NEEDED FOR ED    No Known Allergies  Patient Active Problem List   Diagnosis Date Noted  . Diabetes mellitus type II,  controlled ([place]) 02/13/2021  . Actinic skin damage - scalp and forehead, cryotherapy applied 02/13/21 02/13/2021  . Seasonal allergic rhinitis 05/22/2019  . Erectile dysfunction 04/11/2019  . Hearing loss 12/16/2017  . Mixed hyperlipidemia 05/27/2017  . Decreased platelet count ([place]) 05/27/2017  . Vitamin D deficiency 05/27/2017  . Essential hypertension 05/24/2017  . Eczema 05/24/2017  . Venous stasis dermatitis of both lower extremities 05/24/2017    Family History  Problem Relation Age of Onset  . Hyperlipidemia Mother   . Cancer Mother   . Cancer Father        bladder    Social History   Tobacco Use  Smoking Status Former Smoker  Smokeless Tobacco Never Used    No past surgical history on file.  Immunization History  Administered Date(s) Administered  . Influenza Inj Mdck Quad Pf 07/11/2018  . Influenza, Quadrivalent, Recombinant, Inj, Pf 06/27/2019  . Influenza,inj,Quad PF,6+ Mos 05/20/2017  . Influenza-Unspecified 07/11/2018, 06/24/2020  . PFIZER(Purple Top)SARS-COV-2 Vaccination 12/07/2019, 12/28/2019, 07/26/2020  . Pneumococcal Polysaccharide-23 08/02/2019  . Tdap 04/28/2010, 10/31/2020  . Zoster Recombinat (Shingrix) 08/02/2019, 11/21/2019    Recent Results (from the past 2160 hour(s))  Lipid panel     Status: Abnormal   Collection Time: 02/11/21 12:00 AM  Result Value Ref Range   Cholesterol 132 <200 mg/dL   HDL 35 (L) > OR = 40 mg/dL   Triglycerides 222 (H) <150 mg/dL    Comment: . If a non-fasting specimen was collected, consider repeat triglyceride testing on a fasting specimen if clinically indicated.  Yates Decamp et al. J. of Clin. Lipidol. 9242;6:834-196. Marland Kitchen    LDL Cholesterol (Calc) 69 mg/dL (calc)    Comment: Reference range: <100 . Desirable range <100 mg/dL for primary prevention;   <70 mg/dL for patients with CHD or diabetic patients  with > or = 2 CHD risk factors. Marland Kitchen LDL-C is now calculated using the Martin-Hopkins  calculation, which  is a validated novel method providing  better accuracy than the Friedewald equation in the  estimation of LDL-C.  Cresenciano Genre et al. Annamaria Helling. 2229;798(92): 2061-2068  (http://education.Quest[employer].com/faq/FAQ164)    Total CHOL/HDL Ratio 3.8 <5.0 (calc)   Non-HDL Cholesterol (Calc) 97 <130 mg/dL (calc)    Comment: For patients with diabetes plus 1 major ASCVD risk  factor, treating to a non-HDL-C goal of <100 mg/dL  (LDL-C of <70 mg/dL) is considered a therapeutic  option.   COMPLETE METABOLIC PANEL WITH GFR     Status: Abnormal   Collection Time: 02/11/21 12:00 AM  Result Value Ref Range   Glucose, Bld 121 (H) 65 - 99 mg/dL    Comment: .            Fasting reference interval . For someone without known diabetes, a glucose value between 100 and 125 mg/dL is consistent with prediabetes and should be confirmed with a follow-up test. .    BUN 16 7 - 25 mg/dL   Creat 0.88 0.70 - 1.25 mg/dL    Comment: For patients >20 years of age, the reference limit for Creatinine is approximately 13% higher for  people identified as African-American. .    GFR, Est Non African American 93 > OR = 60 mL/min/1.27m2   GFR, Est African American 107 > OR = 60 mL/min/1.27m2   BUN/Creatinine Ratio NOT APPLICABLE 6 - 22 (calc)   Sodium 141 135 - 146 mmol/L   Potassium 3.6 3.5 - 5.3 mmol/L   Chloride 101 98 - 110 mmol/L   CO2 29 20 - 32 mmol/L   Calcium 10.0 8.6 - 10.3 mg/dL   Total Protein 7.7 6.1 - 8.1 g/dL   Albumin 4.6 3.6 - 5.1 g/dL   Globulin 3.1 1.9 - 3.7 g/dL (calc)   AG Ratio 1.5 1.0 - 2.5 (calc)   Total Bilirubin 1.0 0.2 - 1.2 mg/dL   Alkaline phosphatase (APISO) 73 35 - 144 U/L   AST 29 10 - 35 U/L   ALT 34 9 - 46 U/L  Hemoglobin A1c     Status: Abnormal   Collection Time: 02/11/21 12:00 AM  Result Value Ref Range   Hgb A1c MFr Bld 6.8 (H) <5.7 % of total Hgb    Comment: For someone without known diabetes, a hemoglobin A1c value of 6.5% or greater indicates that they may have   diabetes and this should be confirmed with a follow-up  test. . For someone with known diabetes, a value <7% indicates  that their diabetes is well controlled and a value  greater than or equal to 7% indicates suboptimal  control. A1c targets should be individualized based on  duration of diabetes, age, comorbid conditions, and  other considerations. . Currently, no consensus exists regarding use of hemoglobin A1c for diagnosis of diabetes for children. .    Mean Plasma Glucose 148 mg/dL   eAG (mmol/L) 8.2 mmol/L  CBC     Status: None   Collection Time: 02/11/21 12:00 AM  Result Value Ref Range   WBC 8.3 3.8 - 10.8 Thousand/uL   RBC 5.43 4.20 - 5.80 Million/uL   Hemoglobin 16.7 13.2 - 17.1 g/dL   HCT 49.3 38.5 - 50.0 %   MCV 90.8 80.0 - 100.0 fL   MCH 30.8 27.0 - 33.0 pg   MCHC 33.9 32.0 - 36.0 g/dL   RDW 12.6 11.0 - 15.0 %   Platelets 166 140 - 400 Thousand/uL   MPV 10.6 7.5 - 12.5 fL    No results found.     All questions at time of visit were answered - patient instructed to contact office with any additional concerns or updates. ER/RTC precautions were reviewed with the patient as applicable.   Please note: manual typing as well as voice recognition software may have been used to produce this document - typos may escape review. Please contact Dr. Sheppard Coil for any needed clarifications.

## 2021-03-17 ENCOUNTER — Telehealth: Payer: 59 | Admitting: *Deleted

## 2021-03-17 NOTE — Telephone Encounter (Addendum)
Patient is calling to request a refill on his compound nail fungus medication,prescription has expired and he has not been seen since 2021.

## 2021-03-24 ENCOUNTER — Telehealth: Payer: Self-pay | Admitting: *Deleted

## 2021-03-24 NOTE — Telephone Encounter (Signed)
Faxed prescription request to [facility] per Dr Shaune Pollack approval, received confirmation -03/24/21

## 2021-04-17 ENCOUNTER — Other Ambulatory Visit: Payer: Self-pay | Admitting: Osteopathic Medicine

## 2021-04-17 DIAGNOSIS — I10 Essential (primary) hypertension: Secondary | ICD-10-CM

## 2021-05-07 ENCOUNTER — Other Ambulatory Visit: Payer: Self-pay | Admitting: Osteopathic Medicine

## 2021-05-23 ENCOUNTER — Telehealth: Payer: Self-pay | Admitting: *Deleted

## 2021-05-23 NOTE — Telephone Encounter (Signed)
error 

## 2021-05-26 NOTE — Telephone Encounter (Signed)
error 

## 2021-06-09 ENCOUNTER — Other Ambulatory Visit: Payer: Self-pay

## 2021-06-09 MED ORDER — TADALAFIL 20 MG PO TABS: ORAL_TABLET | ORAL | 3 refills | Status: DC

## 2021-06-09 NOTE — Telephone Encounter (Signed)
Pt called requesting refill of tadalafil.  RX sent to pharmacy.  Charyl Bigger, CMA

## 2021-06-17 ENCOUNTER — Telehealth: Payer: Self-pay | Admitting: Osteopathic Medicine

## 2021-06-17 DIAGNOSIS — I10 Essential (primary) hypertension: Secondary | ICD-10-CM

## 2021-06-17 DIAGNOSIS — E782 Mixed hyperlipidemia: Secondary | ICD-10-CM

## 2021-06-17 DIAGNOSIS — E1159 Type 2 diabetes mellitus with other circulatory complications: Secondary | ICD-10-CM

## 2021-06-17 DIAGNOSIS — E1169 Type 2 diabetes mellitus with other specified complication: Secondary | ICD-10-CM

## 2021-06-17 NOTE — Telephone Encounter (Signed)
PT requesting labs to be ordered for upcoming physical in November since his appt for Thursday was canceled due to PCP leaving.

## 2021-06-17 NOTE — Telephone Encounter (Signed)
Orders signed.

## 2021-06-17 NOTE — Telephone Encounter (Signed)
Pt is scheduled for an appt with Dr. Zigmund Daniel on 08/15/21 at 10:10 AM. It looks like he usually gets a CMP w/GFR, CBC, Lipid and A1C. Do you want to add anything to this? Lab orders has been tee'd up below and ready for review and approval/denial.

## 2021-06-18 NOTE — Telephone Encounter (Signed)
Pt aware via VM that the labs have been entered. Instructed him to come in one morning a week prior to his appt to have the fasting labs drawn. Instructed him to call back with any questions or concerns.

## 2021-06-19 ENCOUNTER — Encounter: Payer: 59 | Admitting: Osteopathic Medicine

## 2021-06-19 ENCOUNTER — Telehealth: Payer: Self-pay

## 2021-06-19 NOTE — Telephone Encounter (Signed)
Medication: tadalafil (CIALIS) 20 MG tablet When I looked on CoverMyMeds, there was instruction to call [place] at [phone] to initiate the PA. When I called them, I was told that the pts pharmacy benefits termed on 04/05/2021. This is the only insurance info we have on file for this pt. MyChart message sent to pt asking him to send [place] his new insurance info so we can proceed with the PA.

## 2021-08-13 LAB — COMPLETE METABOLIC PANEL WITH GFR
AG Ratio: 1.6 (calc) (ref 1.0–2.5)
ALT: 23 U/L (ref 9–46)
AST: 18 U/L (ref 10–35)
Albumin: 4.5 g/dL (ref 3.6–5.1)
Alkaline phosphatase (APISO): 78 U/L (ref 35–144)
BUN: 15 mg/dL (ref 7–25)
CO2: 29 mmol/L (ref 20–32)
Calcium: 9.7 mg/dL (ref 8.6–10.3)
Chloride: 101 mmol/L (ref 98–110)
Creat: 0.81 mg/dL (ref 0.70–1.35)
Globulin: 2.8 g/dL (calc) (ref 1.9–3.7)
Glucose, Bld: 101 mg/dL — ABNORMAL HIGH (ref 65–99)
Potassium: 3.9 mmol/L (ref 3.5–5.3)
Sodium: 139 mmol/L (ref 135–146)
Total Bilirubin: 0.8 mg/dL (ref 0.2–1.2)
Total Protein: 7.3 g/dL (ref 6.1–8.1)
eGFR: 100 mL/min/{1.73_m2} (ref >=60)

## 2021-08-13 LAB — CBC WITH DIFFERENTIAL/PLATELET
Absolute Monocytes: 441 cells/uL (ref 200–950)
Basophils Absolute: 61 cells/uL (ref 0–200)
Basophils Relative: 0.8 %
Eosinophils Absolute: 167 cells/uL (ref 15–500)
Eosinophils Relative: 2.2 %
HCT: 46.8 % (ref 38.5–50.0)
Hemoglobin: 15.8 g/dL (ref 13.2–17.1)
Lymphs Abs: 2090 cells/uL (ref 850–3900)
MCH: 31.2 pg (ref 27.0–33.0)
MCHC: 33.8 g/dL (ref 32.0–36.0)
MCV: 92.3 fL (ref 80.0–100.0)
MPV: 11 fL (ref 7.5–12.5)
Monocytes Relative: 5.8 %
Neutro Abs: 4841 cells/uL (ref 1500–7800)
Neutrophils Relative %: 63.7 %
Platelets: 146 10*3/uL (ref 140–400)
RBC: 5.07 10*6/uL (ref 4.20–5.80)
RDW: 12.1 % (ref 11.0–15.0)
Total Lymphocyte: 27.5 %
WBC: 7.6 10*3/uL (ref 3.8–10.8)

## 2021-08-13 LAB — LIPID PANEL
Cholesterol: 128 mg/dL (ref <200)
HDL: 38 mg/dL — ABNORMAL LOW (ref >=40)
LDL Cholesterol (Calc): 67 mg/dL (calc)
Non-HDL Cholesterol (Calc): 90 mg/dL (calc) (ref <130)
Total CHOL/HDL Ratio: 3.4 (calc) (ref <5.0)
Triglycerides: 155 mg/dL — ABNORMAL HIGH (ref <150)

## 2021-08-13 LAB — HEMOGLOBIN A1C
Hgb A1c MFr Bld: 5.8 % of total Hgb — ABNORMAL HIGH (ref <5.7)
Mean Plasma Glucose: 120 mg/dL
eAG (mmol/L): 6.6 mmol/L

## 2021-08-15 ENCOUNTER — Other Ambulatory Visit: Payer: Self-pay

## 2021-08-15 ENCOUNTER — Encounter: Payer: Self-pay | Admitting: Family Medicine

## 2021-08-15 ENCOUNTER — Ambulatory Visit (INDEPENDENT_AMBULATORY_CARE_PROVIDER_SITE_OTHER): Payer: 59 | Admitting: Family Medicine

## 2021-08-15 VITALS — BP 134/69 | HR 80 | Temp 97.5°F | Ht 75.0 in | Wt 267.1 lb

## 2021-08-15 DIAGNOSIS — Z23 Encounter for immunization: Secondary | ICD-10-CM

## 2021-08-15 DIAGNOSIS — Z0000 Encounter for general adult medical examination without abnormal findings: Secondary | ICD-10-CM

## 2021-08-15 NOTE — Patient Instructions (Signed)

## 2021-08-17 ENCOUNTER — Encounter: Payer: Self-pay | Admitting: Family Medicine

## 2021-08-17 DIAGNOSIS — Z0000 Encounter for general adult medical examination without abnormal findings: Secondary | ICD-10-CM | POA: Insufficient documentation

## 2021-08-17 NOTE — Progress Notes (Signed)
James Ware - 60 y.o. male MRN [id number]  Date of birth: 1961/06/27  Subjective Chief Complaint  Patient presents with   Annual Exam    HPI James Ware is a 60 year old male here today for annual exam.  He is a former patient of Dr. Sheppard Coil.  He does have history of hypertension that is well controlled with hydrochlorothiazide.  Continues to do well with Crestor for management of hyperlipidemia.  He would like to have pneumonia vaccine.  Is a non-smoker.  He does consume alcohol occasionally.  He does walk occasionally for exercise.  He feels like his diet is doing pretty well.  He did have labs completed prior to his visit today which we reviewed.  A1c has improved quite a bit since visit 6 months ago.  Triglycerides have also improved.  Review of Systems  Constitutional:  Negative for chills, fever, malaise/fatigue and weight loss.  HENT:  Negative for congestion, ear pain and sore throat.   Eyes:  Negative for blurred vision, double vision and pain.  Respiratory:  Negative for cough and shortness of breath.   Cardiovascular:  Negative for chest pain and palpitations.  Gastrointestinal:  Negative for abdominal pain, blood in stool, constipation, heartburn and nausea.  Genitourinary:  Negative for dysuria and urgency.  Musculoskeletal:  Negative for joint pain and myalgias.  Neurological:  Negative for dizziness and headaches.  Endo/Heme/Allergies:  Does not bruise/bleed easily.  Psychiatric/Behavioral:  Negative for depression. The patient is not nervous/anxious and does not have insomnia.    No Known Allergies  Past Medical History:  Diagnosis Date   Eczema 05/24/2017   Essential hypertension 05/24/2017   Prediabetes 05/24/2017   Venous stasis dermatitis of both lower extremities 05/24/2017    No past surgical history on file.  Social History   Socioeconomic History   Marital status: Divorced    Spouse name: Not on file   Number of children: Not on file   Years of  education: Not on file   Highest education level: Not on file  Occupational History   Not on file  Tobacco Use   Smoking status: Former   Smokeless tobacco: Never  Vaping Use   Vaping Use: Never used  Substance and Sexual Activity   Alcohol use: Yes   Drug use: Not on file   Sexual activity: Not on file  Other Topics Concern   Not on file  Social History Narrative   Not on file   Social Determinants of Health   Financial Resource Strain: Not on file  Food Insecurity: Not on file  Transportation Needs: Not on file  Physical Activity: Not on file  Stress: Not on file  Social Connections: Not on file    Family History  Problem Relation Age of Onset   Hyperlipidemia Mother    Cancer Mother    Cancer Father        bladder    Health Maintenance  Topic Date Due   OPHTHALMOLOGY EXAM  Never done   HIV Screening  Never done   COLONOSCOPY (Pts 45-2yrs Insurance coverage will need to be confirmed)  07/30/2020   URINE MICROALBUMIN  10/10/2021   HEMOGLOBIN A1C  02/09/2022   FOOT EXAM  08/15/2022   TETANUS/TDAP  10/31/2030   Pneumococcal Vaccine 83-77 Years old  Completed   INFLUENZA VACCINE  Completed   COVID-19 Vaccine  Completed   Hepatitis C Screening  Completed   Zoster Vaccines- Shingrix  Completed   HPV VACCINES  Aged Out     -----------------------------------------------------------------------------------------------------------------------------------------------------------------------------------------------------------------  Physical Exam BP 134/69 (BP Location: Left Arm, Patient Position: Sitting, Cuff Size: Large)   Pulse 80   Temp (!) 97.5 F (36.4 C)   Ht 6\' 3"  (1.905 m)   Wt 267 lb 1.6 oz (121.2 kg)   SpO2 98%   BMI 33.39 kg/m   Physical Exam Constitutional:      General: He is not in acute distress. HENT:     Head: Normocephalic and atraumatic.     Right Ear: Tympanic membrane and external ear normal.     Left Ear: Tympanic membrane and  external ear normal.  Eyes:     General: No scleral icterus. Neck:     Thyroid: No thyromegaly.  Cardiovascular:     Rate and Rhythm: Normal rate and regular rhythm.     Heart sounds: Normal heart sounds.  Pulmonary:     Effort: Pulmonary effort is normal.     Breath sounds: Normal breath sounds.  Abdominal:     General: Bowel sounds are normal. There is no distension.     Palpations: Abdomen is soft.     Tenderness: There is no abdominal tenderness. There is no guarding.  Musculoskeletal:     Cervical back: Normal range of motion.  Lymphadenopathy:     Cervical: No cervical adenopathy.  Skin:    General: Skin is warm and dry.     Findings: No rash.  Neurological:     Mental Status: He is alert and oriented to person, place, and time.     Cranial Nerves: No cranial nerve deficit.     Motor: No abnormal muscle tone.  Psychiatric:        Mood and Affect: Mood normal.        Behavior: Behavior normal.    ------------------------------------------------------------------------------------------------------------------------------------------------------------------------------------------------------------------- Assessment and Plan  Well adult exam Well adult Orders Placed This Encounter  Procedures   Pneumococcal conjugate vaccine 20-valent (Prevnar 20)  Screenings: Recent labs reviewed.  Up-to-date on colon cancer screening. Immunizations: Prevnar 20 given today.   Anticipatory guidance/risk factor reduction: Recommendations per AVS.   No orders of the defined types were placed in this encounter.   No follow-ups on file.    This visit occurred during the SARS-CoV-2 public health emergency.  Safety protocols were in place, including screening questions prior to the visit, additional usage of staff PPE, and extensive cleaning of exam room while observing appropriate contact time as indicated for disinfecting solutions.

## 2021-08-17 NOTE — Assessment & Plan Note (Signed)
Well adult Orders Placed This Encounter  Procedures  . Pneumococcal conjugate vaccine 20-valent (Prevnar 20)  Screenings: Recent labs reviewed.  Up-to-date on colon cancer screening. Immunizations: Prevnar 20 given today.   Anticipatory guidance/risk factor reduction: Recommendations per AVS.

## 2021-08-18 ENCOUNTER — Encounter: Payer: 59 | Admitting: Osteopathic Medicine

## 2021-08-19 LAB — HM COLONOSCOPY

## 2021-08-28 LAB — HM DIABETES EYE EXAM

## 2021-09-01 ENCOUNTER — Encounter: Payer: Self-pay | Admitting: Family Medicine

## 2021-09-11 ENCOUNTER — Encounter: Payer: Self-pay | Admitting: Family Medicine

## 2021-09-12 ENCOUNTER — Encounter: Payer: Self-pay | Admitting: Family Medicine

## 2021-10-08 ENCOUNTER — Other Ambulatory Visit: Payer: Self-pay | Admitting: Osteopathic Medicine

## 2021-10-08 DIAGNOSIS — I10 Essential (primary) hypertension: Secondary | ICD-10-CM

## 2021-10-22 ENCOUNTER — Other Ambulatory Visit: Payer: Self-pay | Admitting: Osteopathic Medicine

## 2021-11-26 ENCOUNTER — Telehealth: Payer: 59 | Admitting: Physician Assistant

## 2021-11-26 DIAGNOSIS — S76012A Strain of muscle, fascia and tendon of left hip, initial encounter: Secondary | ICD-10-CM

## 2021-11-26 DIAGNOSIS — M7552 Bursitis of left shoulder: Secondary | ICD-10-CM | POA: Diagnosis not present

## 2021-11-26 DIAGNOSIS — K640 First degree hemorrhoids: Secondary | ICD-10-CM | POA: Diagnosis not present

## 2021-11-26 MED ORDER — PREDNISONE 10 MG (21) PO TBPK: ORAL_TABLET | ORAL | 0 refills | Status: DC

## 2021-11-26 MED ORDER — HYDROCORTISONE ACETATE 25 MG RE SUPP: 25.0000 mg | Freq: Two times a day (BID) | RECTAL | 0 refills | Status: DC

## 2021-11-26 NOTE — Patient Instructions (Signed)
James Ware, thank you for joining [name], PA-C for today's virtual visit.  While this provider is not your primary care provider (PCP), if your PCP is located in our provider database this encounter information will be shared with them immediately following your visit.  Consent: (Patient) James Ware provided verbal consent for this virtual visit at the beginning of the encounter.  Current Medications:  Current Outpatient Medications:    hydrocortisone (ANUSOL-HC) 25 MG suppository, Place 1 suppository (25 mg total) rectally 2 (two) times daily., Disp: 12 suppository, Rfl: 0   predniSONE (STERAPRED UNI-PAK 21 TAB) 10 MG (21) TBPK tablet, 6 day taper; take as directed on package instructions, Disp: 21 tablet, Rfl: 0   Ascorbic Acid (VITAMIN C) 1000 MG tablet, Take 1,000 mg by mouth daily., Disp: , Rfl:    ASHWAGANDHA PO, Take by mouth., Disp: , Rfl:    Cholecalciferol (VITAMIN D-3) 125 MCG (5000 UT) TABS, Take by mouth., Disp: , Rfl:    hydrochlorothiazide (HYDRODIURIL) 25 MG tablet, TAKE 1 TABLET BY MOUTH EVERY DAY, Disp: 90 tablet, Rfl: 1   Krill Oil 300 MG CAPS, Take 300 mg by mouth daily., Disp: , Rfl:    Multiple Vitamin (MULTIVITAMIN) tablet, Take by mouth., Disp: , Rfl:    NONFORMULARY OR COMPOUNDED ITEM, [facility]:  Antifungal Topical - Terbinafine 3%, Fluconazole 2%, Tea Tree Oil 10%, Ibuprofen 2%, in DMSO Suspension #60ml. Apply to affected toenail(s) once (at bedtime) or twice daily., Disp: 30 each, Rfl: 3   Probiotic Product (PROBIOTIC ACIDOPHILUS BEADS) CAPS, Take by mouth., Disp: , Rfl:    rosuvastatin (CRESTOR) 10 MG tablet, TAKE 1 TABLET BY MOUTH EVERY DAY, Disp: 90 tablet, Rfl: 3   tadalafil (CIALIS) 20 MG tablet, TAKE 1/2 TO 1 TABLET BY MOUTH AS NEEDED FOR ED, Disp: 15 tablet, Rfl: 3   zinc gluconate 50 MG tablet, Take 50 mg by mouth daily., Disp: , Rfl:    Medications ordered in this encounter:  Meds ordered this encounter  Medications    hydrocortisone (ANUSOL-HC) 25 MG suppository    Sig: Place 1 suppository (25 mg total) rectally 2 (two) times daily.    Dispense:  12 suppository    Refill:  0    Order Specific Question:   Supervising Provider    Answer:   MILLER, BRIAN [[id number]]   predniSONE (STERAPRED UNI-PAK 21 TAB) 10 MG (21) TBPK tablet    Sig: 6 day taper; take as directed on package instructions    Dispense:  21 tablet    Refill:  0    Order Specific Question:   Supervising Provider    Answer:   Sabra Heck, [place]     *If you need refills on other medications prior to your next appointment, please contact your pharmacy*  Follow-Up: Call back or seek an in-person evaluation if the symptoms worsen or if the condition fails to improve as anticipated.  Other Instructions Bursitis Bursitis is inflammation and irritation of a bursa, which is one of the small, fluid-filled sacs that cushion and protect the moving parts of your body. These sacs are located between bones and muscles, bones and muscle attachments, or bones and skin areas that are next to bones. A bursa protects those structures from the wear and tear that results from frequent movement. An inflamed bursa causes pain and swelling. Fluid may build up inside the sac. Bursitis is most common near joints, especially the knees, elbows, hips, and shoulders. What are the causes? This condition  may be caused by: Injury from: A direct hit (blow), like falling on your knee or elbow. Overuse of a joint (repetitive stress). Infection. This can happen if bacteria get into a bursa through a cut or scrape near a joint. Diseases that cause joint inflammation, such as gout and rheumatoid arthritis. What increases the risk? You are more likely to develop this condition if you: Have a job or hobby that involves a lot of repetitive stress on your joints. Have a condition that weakens your body's defense system (immune system), such as diabetes, cancer, or HIV. Do any of  these often: Lift and reach overhead. Kneel or lean on hard surfaces. Run or walk. What are the signs or symptoms? The most common symptoms of this condition include: Pain that gets worse when you move the affected body part or use it to support (bear) your body weight. Inflammation. Stiffness. Other symptoms include: Redness. Swelling. Tenderness. Warmth. Pain that continues after rest. Fever or chills. These may occur in bursitis that is caused by infection. How is this diagnosed? This condition may be diagnosed based on: Your medical history and a physical exam. Imaging tests, such as an MRI. A procedure to drain fluid from the bursa with a needle (aspiration). The fluid may be checked for signs of infection or gout. Blood tests to rule out other causes of inflammation. How is this treated? This condition can usually be treated at home with rest, ice, applying pressure (compression), and raising the body part that is affected (elevation). This is called RICE therapy. For mild bursitis, RICE therapy may be all you need. Other treatments may include: NSAIDs to treat pain and inflammation. Corticosteroid medicines to fight inflammation. These medicines may be injected into and around the area of bursitis. Aspiration of fluid from the bursa to relieve pain and improve movement. Antibiotic medicine to treat an infected bursa. A splint, brace, or walking aid, such as a cane. Physical therapy if you continue to have pain or limited movement. Surgery to remove a damaged or infected bursa. This may be needed if other treatments have not worked. Follow these instructions at home: Medicines Take over-the-counter and prescription medicines only as told by your health care provider. If you were prescribed an antibiotic medicine, take it as told by your health care provider. Do not stop taking the antibiotic even if you start to feel better. General instructions  Rest the affected area as  told by your health care provider. If possible, raise (elevate) the affected area above the level of your heart while you are sitting or lying down. Avoid activities that make pain worse. Use splints, braces, pads, or walking aids as told by your health care provider. If directed, put ice on the affected area: If you have a removable splint or brace, remove it as told by your health care provider. Put ice in a plastic bag. Place a towel between your skin and the bag or between your splint or brace and the bag. Leave the ice on for 20 minutes, 2-3 times a day. Keep all follow-up visits as told by your health care provider. This is important. Preventing future episodes Take actions to help prevent future episodes of bursitis. Wear knee pads if you kneel often. Wear sturdy running or walking shoes that fit you well. Take breaks regularly from repetitive activity. Warm up by stretching before doing any activity that takes a lot of effort. Maintain a healthy weight or lose weight as recommended by your health care  provider. If you need help doing this, ask your health care provider. Exercise regularly. Start any new physical activity gradually. Contact a health care provider if you: Have a fever. Have chills. Have bursitis that is not getting better with treatment or home care. Summary Bursitis is inflammation and irritation of a bursa, which is one of the small, fluid-filled sacs that cushion and protect the moving parts of your body. An inflamed bursa causes pain and swelling. Bursitis is commonly diagnosed with a physical exam, but other tests are sometimes needed. This condition can usually be treated at home with rest, ice, applying pressure (compression), and raising the body part that is affected (elevation). This is called RICE therapy. This information is not intended to replace advice given to you by your health care provider. Make sure you discuss any questions you have with your  health care provider. Document Revised: 02/21/2020 Document Reviewed: 03/20/2020 Elsevier Patient Education  [place].  Shoulder Exercises Ask your health care provider which exercises are safe for you. Do exercises exactly as told by your health care provider and adjust them as directed. It is normal to feel mild stretching, pulling, tightness, or discomfort as you do these exercises. Stop right away if you feel sudden pain or your pain gets worse. Do not begin these exercises until told by your health care provider. Stretching exercises External rotation and abduction This exercise is sometimes called corner stretch. This exercise rotates your arm outward (external rotation) and moves your arm out from your body (abduction). Stand in a doorway with one of your feet slightly in front of the other. This is called a staggered stance. If you cannot reach your forearms to the door frame, stand facing a corner of a room. Choose one of the following positions as told by your health care provider: Place your hands and forearms on the door frame above your head. Place your hands and forearms on the door frame at the height of your head. Place your hands on the door frame at the height of your elbows. Slowly move your weight onto your front foot until you feel a stretch across your chest and in the front of your shoulders. Keep your head and chest upright and keep your abdominal muscles tight. Hold for __________ seconds. To release the stretch, shift your weight to your back foot. Repeat __________ times. Complete this exercise __________ times a day. Extension, standing Stand and hold a broomstick, a cane, or a similar object behind your back. Your hands should be a little wider than shoulder width apart. Your palms should face away from your back. Keeping your elbows straight and your shoulder muscles relaxed, move the stick away from your body until you feel a stretch in your shoulders  (extension). Avoid shrugging your shoulders while you move the stick. Keep your shoulder blades tucked down toward the middle of your back. Hold for __________ seconds. Slowly return to the starting position. Repeat __________ times. Complete this exercise __________ times a day. Range-of-motion exercises Pendulum  Stand near a wall or a surface that you can hold onto for balance. Bend at the waist and let your left / right arm hang straight down. Use your other arm to support you. Keep your back straight and do not lock your knees. Relax your left / right arm and shoulder muscles, and move your hips and your trunk so your left / right arm swings freely. Your arm should swing because of the motion of your body,  not because you are using your arm or shoulder muscles. Keep moving your hips and trunk so your arm swings in the following directions, as told by your health care provider: Side to side. Forward and backward. In clockwise and counterclockwise circles. Continue each motion for __________ seconds, or for as long as told by your health care provider. Slowly return to the starting position. Repeat __________ times. Complete this exercise __________ times a day. Shoulder flexion, standing  Stand and hold a broomstick, a cane, or a similar object. Place your hands a little more than shoulder width apart on the object. Your left / right hand should be palm up, and your other hand should be palm down. Keep your elbow straight and your shoulder muscles relaxed. Push the stick up with your healthy arm to raise your left / right arm in front of your body, and then over your head until you feel a stretch in your shoulder (flexion). Avoid shrugging your shoulder while you raise your arm. Keep your shoulder blade tucked down toward the middle of your back. Hold for __________ seconds. Slowly return to the starting position. Repeat __________ times. Complete this exercise __________ times a  day. Shoulder abduction, standing Stand and hold a broomstick, a cane, or a similar object. Place your hands a little more than shoulder width apart on the object. Your left / right hand should be palm up, and your other hand should be palm down. Keep your elbow straight and your shoulder muscles relaxed. Push the object across your body toward your left / right side. Raise your left / right arm to the side of your body (abduction) until you feel a stretch in your shoulder. Do not raise your arm above shoulder height unless your health care provider tells you to do that. If directed, raise your arm over your head. Avoid shrugging your shoulder while you raise your arm. Keep your shoulder blade tucked down toward the middle of your back. Hold for __________ seconds. Slowly return to the starting position. Repeat __________ times. Complete this exercise __________ times a day. Internal rotation  Place your left / right hand behind your back, palm up. Use your other hand to dangle an exercise band, a towel, or a similar object over your shoulder. Grasp the band with your left / right hand so you are holding on to both ends. Gently pull up on the band until you feel a stretch in the front of your left / right shoulder. The movement of your arm toward the center of your body is called internal rotation. Avoid shrugging your shoulder while you raise your arm. Keep your shoulder blade tucked down toward the middle of your back. Hold for __________ seconds. Release the stretch by letting go of the band and lowering your hands. Repeat __________ times. Complete this exercise __________ times a day. Strengthening exercises External rotation  Sit in a stable chair without armrests. Secure an exercise band to a stable object at elbow height on your left / right side. Place a soft object, such as a folded towel or a small pillow, between your left / right upper arm and your body to move your elbow about 4  inches (10 cm) away from your side. Hold the end of the exercise band so it is tight and there is no slack. Keeping your elbow pressed against the soft object, slowly move your forearm out, away from your abdomen (external rotation). Keep your body steady so only your forearm moves.  Hold for __________ seconds. Slowly return to the starting position. Repeat __________ times. Complete this exercise __________ times a day. Shoulder abduction  Sit in a stable chair without armrests, or stand up. Hold a __________ weight in your left / right hand, or hold an exercise band with both hands. Start with your arms straight down and your left / right palm facing in, toward your body. Slowly lift your left / right hand out to your side (abduction). Do not lift your hand above shoulder height unless your health care provider tells you that this is safe. Keep your arms straight. Avoid shrugging your shoulder while you do this movement. Keep your shoulder blade tucked down toward the middle of your back. Hold for __________ seconds. Slowly lower your arm, and return to the starting position. Repeat __________ times. Complete this exercise __________ times a day. Shoulder extension Sit in a stable chair without armrests, or stand up. Secure an exercise band to a stable object in front of you so it is at shoulder height. Hold one end of the exercise band in each hand. Your palms should face each other. Straighten your elbows and lift your hands up to shoulder height. Step back, away from the secured end of the exercise band, until the band is tight and there is no slack. Squeeze your shoulder blades together as you pull your hands down to the sides of your thighs (extension). Stop when your hands are straight down by your sides. Do not let your hands go behind your body. Hold for __________ seconds. Slowly return to the starting position. Repeat __________ times. Complete this exercise __________ times a  day. Shoulder row Sit in a stable chair without armrests, or stand up. Secure an exercise band to a stable object in front of you so it is at waist height. Hold one end of the exercise band in each hand. Position your palms so that your thumbs are facing the ceiling (neutral position). Bend each of your elbows to a 90-degree angle (right angle) and keep your upper arms at your sides. Step back until the band is tight and there is no slack. Slowly pull your elbows back behind you. Hold for __________ seconds. Slowly return to the starting position. Repeat __________ times. Complete this exercise __________ times a day. Shoulder press-ups  Sit in a stable chair that has armrests. Sit upright, with your feet flat on the floor. Put your hands on the armrests so your elbows are bent and your fingers are pointing forward. Your hands should be about even with the sides of your body. Push down on the armrests and use your arms to lift yourself off the chair. Straighten your elbows and lift yourself up as much as you comfortably can. Move your shoulder blades down, and avoid letting your shoulders move up toward your ears. Keep your feet on the ground. As you get stronger, your feet should support less of your body weight as you lift yourself up. Hold for __________ seconds. Slowly lower yourself back into the chair. Repeat __________ times. Complete this exercise __________ times a day. Wall push-ups  Stand so you are facing a stable wall. Your feet should be about one arm-length away from the wall. Lean forward and place your palms on the wall at shoulder height. Keep your feet flat on the floor as you bend your elbows and lean forward toward the wall. Hold for __________ seconds. Straighten your elbows to push yourself back to the starting position. Repeat  __________ times. Complete this exercise __________ times a day. This information is not intended to replace advice given to you by your  health care provider. Make sure you discuss any questions you have with your health care provider. Document Revised: 01/06/2019 Document Reviewed: 10/14/2018 Elsevier Patient Education  [employer].   Hip Exercises Ask your health care provider which exercises are safe for you. Do exercises exactly as told by your health care provider and adjust them as directed. It is normal to feel mild stretching, pulling, tightness, or discomfort as you do these exercises. Stop right away if you feel sudden pain or your pain gets worse. Do not begin these exercises until told by your health care provider. Stretching and range-of-motion exercises These exercises warm up your muscles and joints and improve the movement and flexibility of your hip. These exercises also help to relieve pain, numbness, and tingling. You may be asked to limit your range of motion if you had a hip replacement. Talk to your health care provider about these restrictions. Hamstrings, supine  Lie on your back (supine position). Loop a belt or towel over the ball of your left / right foot. The ball of your foot is on the walking surface, right under your toes. Straighten your left / right knee and slowly pull on the belt or towel to raise your leg until you feel a gentle stretch behind your knee (hamstring). Do not let your knee bend while you do this. Keep your other leg flat on the floor. Hold this position for __________ seconds. Slowly return your leg to the starting position. Repeat __________ times. Complete this exercise __________ times a day. Hip rotation  Lie on your back on a firm surface. With your left / right hand, gently pull your left / right knee toward the shoulder that is on the same side of the body. Stop when your knee is pointing toward the ceiling. Hold your left / right ankle with your other hand. Keeping your knee steady, gently pull your left / right ankle toward your other shoulder until you feel a  stretch in your buttocks. Keep your hips and shoulders firmly planted while you do this stretch. Hold this position for __________ seconds. Repeat __________ times. Complete this exercise __________ times a day. Seated stretch This exercise is sometimes called hamstrings and adductors stretch. Sit on the floor with your legs stretched wide. Keep your knees straight during this exercise. Keeping your head and back in a straight line, bend at your waist to reach for your left foot (position A). You should feel a stretch in your right inner thigh (adductors). Hold this position for __________ seconds. Then slowly return to the upright position. Keeping your head and back in a straight line, bend at your waist to reach forward (position B). You should feel a stretch behind both of your thighs and knees (hamstrings). Hold this position for __________ seconds. Then slowly return to the upright position. Keeping your head and back in a straight line, bend at your waist to reach for your right foot (position C). You should feel a stretch in your left inner thigh (adductors). Hold this position for __________ seconds. Then slowly return to the upright position. Repeat __________ times. Complete this exercise __________ times a day. Lunge This exercise stretches the muscles of the hip (hip flexors). Place your left / right knee on the floor and bend your other knee so that is directly over your ankle. You should be half-kneeling.  Keep good posture with your head over your shoulders. Tighten your buttocks to point your tailbone downward. This will prevent your back from arching too much. You should feel a gentle stretch in the front of your left / right thigh and hip. If you do not feel a stretch, slide your other foot forward slightly and then slowly lunge forward with your chest up until your knee once again lines up over your ankle. Make sure your tailbone continues to point downward. Hold this position  for __________ seconds. Slowly return to the starting position. Repeat __________ times. Complete this exercise __________ times a day. Strengthening exercises These exercises build strength and endurance in your hip. Endurance is the ability to use your muscles for a long time, even after they get tired. Bridge This exercise strengthens the muscles of your hip (hip extensors). Lie on your back on a firm surface with your knees bent and your feet flat on the floor. Tighten your buttocks muscles and lift your bottom off the floor until the trunk of your body and your hips are level with your thighs. Do not arch your back. You should feel the muscles working in your buttocks and the back of your thighs. If you do not feel these muscles, slide your feet 1-2 inches (2.5-5 cm) farther away from your buttocks. Hold this position for __________ seconds. Slowly lower your hips to the starting position. Let your muscles relax completely between repetitions. Repeat __________ times. Complete this exercise __________ times a day. Straight leg raises, side-lying This exercise strengthens the muscles that move the hip joint away from the center of the body (hip abductors). Lie on your side with your left / right leg in the top position. Lie so your head, shoulder, hip, and knee line up. You may bend your bottom knee slightly to help you balance. Roll your hips slightly forward, so your hips are stacked directly over each other and your left / right knee is facing forward. Leading with your heel, lift your top leg 4-6 inches (10-15 cm). You should feel the muscles in your top hip lifting. Do not let your foot drift forward. Do not let your knee roll toward the ceiling. Hold this position for __________ seconds. Slowly return to the starting position. Let your muscles relax completely between repetitions. Repeat __________ times. Complete this exercise __________ times a day. Straight leg raises,  side-lying This exercise strengthens the muscles that move the hip joint toward the center of the body (hip adductors). Lie on your side with your left / right leg in the bottom position. Lie so your head, shoulder, hip, and knee line up. You may place your upper foot in front to help you balance. Roll your hips slightly forward, so your hips are stacked directly over each other and your left / right knee is facing forward. Tense the muscles in your inner thigh and lift your bottom leg 4-6 inches (10-15 cm). Hold this position for __________ seconds. Slowly return to the starting position. Let your muscles relax completely between repetitions. Repeat __________ times. Complete this exercise __________ times a day. Straight leg raises, supine This exercise strengthens the muscles in the front of your thigh (quadriceps). Lie on your back (supine position) with your left / right leg extended and your other knee bent. Tense the muscles in the front of your left / right thigh. You should see your kneecap slide up or see increased dimpling just above your knee. Keep these muscles tight as  you raise your leg 4-6 inches (10-15 cm) off the floor. Do not let your knee bend. Hold this position for __________ seconds. Keep these muscles tense as you lower your leg. Relax the muscles slowly and completely between repetitions. Repeat __________ times. Complete this exercise __________ times a day. Hip abductors, standing This exercise strengthens the muscles that move the leg and hip joint away from the center of the body (hip abductors). Tie one end of a rubber exercise band or tubing to a secure surface, such as a chair, table, or pole. Loop the other end of the band or tubing around your left / right ankle. Keeping your ankle with the band or tubing directly opposite the secured end, step away until there is tension in the tubing or band. Hold on to a chair, table, or pole as needed for balance. Lift  your left / right leg out to your side. While you do this: Keep your back upright. Keep your shoulders over your hips. Keep your toes pointing forward. Make sure to use your hip muscles to slowly lift your leg. Do not tip your body or forcefully lift your leg. Hold this position for __________ seconds. Slowly return to the starting position. Repeat __________ times. Complete this exercise __________ times a day. Squats This exercise strengthens the muscles in the front of your thigh (quadriceps). Stand in a door frame so your feet and knees are in line with the frame. You may place your hands on the frame for balance. Slowly bend your knees and lower your hips like you are going to sit in a chair. Keep your lower legs in a straight-up-and-down position. Do not let your hips go lower than your knees. Do not bend your knees lower than told by your health care provider. If your hip pain increases, do not bend as low. Hold this position for ___________ seconds. Slowly push with your legs to return to standing. Do not use your hands to pull yourself to standing. Repeat __________ times. Complete this exercise __________ times a day. This information is not intended to replace advice given to you by your health care provider. Make sure you discuss any questions you have with your health care provider. Document Revised: 01/29/2021 Document Reviewed: 01/29/2021 Elsevier Patient Education  2022 [employer].  Hemorrhoids Hemorrhoids are swollen veins in and around the rectum or anus. There are two types of hemorrhoids: Internal hemorrhoids. These occur in the veins that are just inside the rectum. They may poke through to the outside and become irritated and painful. External hemorrhoids. These occur in the veins that are outside the anus and can be felt as a painful swelling or hard lump near the anus. Most hemorrhoids do not cause serious problems, and they can be managed with home treatments  such as diet and lifestyle changes. If home treatments do not help the symptoms, procedures can be done to shrink or remove the hemorrhoids. What are the causes? This condition is caused by increased pressure in the anal area. This pressure may result from various things, including: Constipation. Straining to have a bowel movement. Diarrhea. Pregnancy. Obesity. Sitting for long periods of time. Heavy lifting or other activity that causes you to strain. Anal sex. Riding a bike for a long period of time. What are the signs or symptoms? Symptoms of this condition include: Pain. Anal itching or irritation. Rectal bleeding. Leakage of stool (feces). Anal swelling. One or more lumps around the anus. How is this diagnosed? This  condition can often be diagnosed through a visual exam. Other exams or tests may also be done, such as: An exam that involves feeling the rectal area with a gloved hand (digital rectal exam). An exam of the anal canal that is done using a small tube (anoscope). A blood test, if you have lost a significant amount of blood. A test to look inside the colon using a flexible tube with a camera on the end (sigmoidoscopy or colonoscopy). How is this treated? This condition can usually be treated at home. However, various procedures may be done if dietary changes, lifestyle changes, and other home treatments do not help your symptoms. These procedures can help make the hemorrhoids smaller or remove them completely. Some of these procedures involve surgery, and others do not. Common procedures include: Rubber band ligation. Rubber bands are placed at the base of the hemorrhoids to cut off their blood supply. Sclerotherapy. Medicine is injected into the hemorrhoids to shrink them. Infrared coagulation. A type of light energy is used to get rid of the hemorrhoids. Hemorrhoidectomy surgery. The hemorrhoids are surgically removed, and the veins that supply them are tied  off. Stapled hemorrhoidopexy surgery. The surgeon staples the base of the hemorrhoid to the rectal wall. Follow these instructions at home: Eating and drinking  Eat foods that have a lot of fiber in them, such as whole grains, beans, nuts, fruits, and vegetables. Ask your health care provider about taking products that have added fiber (fiber supplements). Reduce the amount of fat in your diet. You can do this by eating low-fat dairy products, eating less red meat, and avoiding processed foods. Drink enough fluid to keep your urine pale yellow. Managing pain and swelling  Take warm sitz baths for 20 minutes, 3-4 times a day to ease pain and discomfort. You may do this in a bathtub or using a portable sitz bath that fits over the toilet. If directed, apply ice to the affected area. Using ice packs between sitz baths may be helpful. Put ice in a plastic bag. Place a towel between your skin and the bag. Leave the ice on for 20 minutes, 2-3 times a day. General instructions Take over-the-counter and prescription medicines only as told by your health care provider. Use medicated creams or suppositories as told. Get regular exercise. Ask your health care provider how much and what kind of exercise is best for you. In general, you should do moderate exercise for at least 30 minutes on most days of the week (150 minutes each week). This can include activities such as walking, biking, or yoga. Go to the bathroom when you have the urge to have a bowel movement. Do not wait. Avoid straining to have bowel movements. Keep the anal area dry and clean. Use wet toilet paper or moist towelettes after a bowel movement. Do not sit on the toilet for long periods of time. This increases blood pooling and pain. Keep all follow-up visits as told by your health care provider. This is important. Contact a health care provider if you have: Increasing pain and swelling that are not controlled by treatment or  medicine. Difficulty having a bowel movement, or you are unable to have a bowel movement. Pain or inflammation outside the area of the hemorrhoids. Get help right away if you have: Uncontrolled bleeding from your rectum. Summary Hemorrhoids are swollen veins in and around the rectum or anus. Most hemorrhoids can be managed with home treatments such as diet and lifestyle changes. Taking warm  sitz baths can help ease pain and discomfort. In severe cases, procedures or surgery can be done to shrink or remove the hemorrhoids. This information is not intended to replace advice given to you by your health care provider. Make sure you discuss any questions you have with your health care provider. Document Revised: 03/26/2021 Document Reviewed: 03/26/2021 Elsevier Patient Education  2022 [employer].    If you have been instructed to have an in-person evaluation today at a local Urgent Care facility, please use the link below. It will take you to a list of all of our available [facility], including address, phone number and hours of operation. Please do not delay care.  [facility]  If you or a family member do not have a primary care provider, use the link below to schedule a visit and establish care. When you choose a [facility] primary care physician or advanced practice provider, you gain a long-term partner in health. Find a Primary Care Provider  Learn more about [facility]'s in-office and virtual care options: [facility]

## 2021-11-26 NOTE — Progress Notes (Signed)
?Virtual Visit Consent  ? ?James Ware, you are scheduled for a virtual visit with a [facility] provider today.   ?  ?Just as with appointments in the office, your consent must be obtained to participate.  Your consent will be active for this visit and any virtual visit you may have with one of our providers in the next 365 days.   ?  ?If you have a MyChart account, a copy of this consent can be sent to you electronically.  All virtual visits are billed to your insurance company just like a traditional visit in the office.   ? ?As this is a virtual visit, video technology does not allow for your provider to perform a traditional examination.  This may limit your provider's ability to fully assess your condition.  If your provider identifies any concerns that need to be evaluated in person or the need to arrange testing (such as labs, EKG, etc.), we will make arrangements to do so.   ?  ?Although advances in technology are sophisticated, we cannot ensure that it will always work on either your end or our end.  If the connection with a video visit is poor, the visit may have to be switched to a telephone visit.  With either a video or telephone visit, we are not always able to ensure that we have a secure connection.    ? ?I need to obtain your verbal consent now.   Are you willing to proceed with your visit today?  ?  ?James Ware has provided verbal consent on 11/26/2021 for a virtual visit (video or telephone). ?  ?[name], PA-C  ? ?Date: 11/26/2021 12:57 PM ? ? ?Virtual Visit via Video Note  ? James Ware, connected with  James Ware  ([id number], [birth year]) on 11/26/21 at 12:30 PM EST by a video-enabled telemedicine application and verified that I am speaking with the correct person using two identifiers. ? ?Location: ?Patient: Virtual Visit Location Patient: Home ?Provider: Virtual Visit Location Provider: Home Office ?  ?I discussed the limitations of evaluation and management by  telemedicine and the availability of in person appointments. The patient expressed understanding and agreed to proceed.   ? ?History of Present Illness: ?James Ware is a 61 y.o. who identifies as a male who was assigned male at birth, and is being seen today for multiple complaints. ? ?Hemorrhoid: Noticed a small bump about the size of a pea inside the rectum. Over next day or two it increased to about marble size. Tried witch hazel wipes. Has had some bleeding with a BM once. Reduced in size on Saturday, and is back to pea-size.  ? ?Arm Pain: Started following last covid vaccine in October 2022. Has had consistent pain at the site of the last injection. Reports any time he is reaching out with the left arm he feels sharp pain at the area of the injection. ? ?Hip Pain: Started in Nov 2022 following an injury/strain of the medial muscles of the left hip. Had long drive to PCP in [place] in Nov 2022. Did not have any issues previously and drive up was ok. Drive back he noticed some stiffness. Next day started noticing it was uncomfortable to lift left leg and put foot on right knee (hip external rotation motion). Reports pain feels superficial in this motion, not deep. Does not have any issues with walking or moving, only this motion. Has not noticed any rash or lumps.  ? ? ?Problems:  ?  Patient Active Problem List  ? Diagnosis Date Noted  ? Well adult exam 08/17/2021  ? Actinic skin damage - scalp and forehead, cryotherapy applied 02/13/21 02/13/2021  ? Seasonal allergic rhinitis 05/22/2019  ? Erectile dysfunction 04/11/2019  ? Hearing loss 12/16/2017  ? Eczema 05/24/2017  ? Venous stasis dermatitis of both lower extremities 05/24/2017  ?  ?Allergies: No Known Allergies ?Medications:  ?Current Outpatient Medications:  ?  hydrocortisone (ANUSOL-HC) 25 MG suppository, Place 1 suppository (25 mg total) rectally 2 (two) times daily., Disp: 12 suppository, Rfl: 0 ?  predniSONE (STERAPRED UNI-PAK 21 TAB) 10 MG (21)  TBPK tablet, 6 day taper; take as directed on package instructions, Disp: 21 tablet, Rfl: 0 ?  Ascorbic Acid (VITAMIN C) 1000 MG tablet, Take 1,000 mg by mouth daily., Disp: , Rfl:  ?  ASHWAGANDHA PO, Take by mouth., Disp: , Rfl:  ?  Cholecalciferol (VITAMIN D-3) 125 MCG (5000 UT) TABS, Take by mouth., Disp: , Rfl:  ?  hydrochlorothiazide (HYDRODIURIL) 25 MG tablet, TAKE 1 TABLET BY MOUTH EVERY DAY, Disp: 90 tablet, Rfl: 1 ?  Krill Oil 300 MG CAPS, Take 300 mg by mouth daily., Disp: , Rfl:  ?  Multiple Vitamin (MULTIVITAMIN) tablet, Take by mouth., Disp: , Rfl:  ?  NONFORMULARY OR COMPOUNDED ITEM, [facility]:  Antifungal Topical - Terbinafine 3%, Fluconazole 2%, Tea Tree Oil 10%, Ibuprofen 2%, in DMSO Suspension #46ml. Apply to affected toenail(s) once (at bedtime) or twice daily., Disp: 30 each, Rfl: 3 ?  Probiotic Product (PROBIOTIC ACIDOPHILUS BEADS) CAPS, Take by mouth., Disp: , Rfl:  ?  rosuvastatin (CRESTOR) 10 MG tablet, TAKE 1 TABLET BY MOUTH EVERY DAY, Disp: 90 tablet, Rfl: 3 ?  tadalafil (CIALIS) 20 MG tablet, TAKE 1/2 TO 1 TABLET BY MOUTH AS NEEDED FOR ED, Disp: 15 tablet, Rfl: 3 ?  zinc gluconate 50 MG tablet, Take 50 mg by mouth daily., Disp: , Rfl:  ? ?Observations/Objective: ?Patient is well-developed, well-nourished in no acute distress.  ?Resting comfortably at home.  ?Head is normocephalic, atraumatic.  ?No labored breathing.  ?Speech is clear and coherent with logical content.  ?Patient is alert and oriented at baseline.  ? ? ?Assessment and Plan: ?1. Grade I hemorrhoids ?- hydrocortisone (ANUSOL-HC) 25 MG suppository; Place 1 suppository (25 mg total) rectally 2 (two) times daily.  Dispense: 12 suppository; Refill: 0 ? ?2. Deltoid bursitis, left ?- predniSONE (STERAPRED UNI-PAK 21 TAB) 10 MG (21) TBPK tablet; 6 day taper; take as directed on package instructions  Dispense: 21 tablet; Refill: 0 ? ?3. Hip strain, left, initial encounter ?- predniSONE (STERAPRED UNI-PAK 21 TAB) 10 MG (21)  TBPK tablet; 6 day taper; take as directed on package instructions  Dispense: 21 tablet; Refill: 0 ? ?- Initial complaint seems consistent with a hemorrhoid. ?- Appears to be improving, but not completely resolved ?- Add anusol-HC suppositories ?- Epsom salt soaks/sitz baths can help ?- Seek in person evaluation if not improving or if it worsens ?- Left arm pain seems consistent with bursitis of the deltoid muscle secondary to injection; per patient report seems they may have injected over the bursa ?- Will add prednisone 6 day taper ?- Warm compresses to the area ?- Exercises and stretches provided via AVS ?- Hip strain, discussed OA and hernia as well (OA lower suspicion due to only pain in certain movement and no issues walking; hernia lower suspicion due to no palpable bump, no pain with lifting,etc) ?- Prednisone prescribed should help as well ?-  Exercises and stretches provided via AVS  ?- Seek in person evaluation at orthopedics for arm and hip if pain persists or worsens ? ?Follow Up Instructions: ?I discussed the assessment and treatment plan with the patient. The patient was provided an opportunity to ask questions and all were answered. The patient agreed with the plan and demonstrated an understanding of the instructions.  A copy of instructions were sent to the patient via MyChart unless otherwise noted below.  ? ?The patient was advised to call back or seek an in-person evaluation if the symptoms worsen or if the condition fails to improve as anticipated. ? ?Time:  ?I spent 30 minutes with the patient via telehealth technology discussing the above problems/concerns.   ? ?[name], PA-C ? ?

## 2021-12-05 ENCOUNTER — Ambulatory Visit (INDEPENDENT_AMBULATORY_CARE_PROVIDER_SITE_OTHER): Payer: 59

## 2021-12-05 ENCOUNTER — Ambulatory Visit (INDEPENDENT_AMBULATORY_CARE_PROVIDER_SITE_OTHER): Payer: 59 | Admitting: Sports Medicine

## 2021-12-05 ENCOUNTER — Other Ambulatory Visit: Payer: Self-pay

## 2021-12-05 DIAGNOSIS — M1612 Unilateral primary osteoarthritis, left hip: Secondary | ICD-10-CM | POA: Insufficient documentation

## 2021-12-05 DIAGNOSIS — M25552 Pain in left hip: Secondary | ICD-10-CM

## 2021-12-05 DIAGNOSIS — M7542 Impingement syndrome of left shoulder: Secondary | ICD-10-CM | POA: Diagnosis not present

## 2021-12-05 DIAGNOSIS — M160 Bilateral primary osteoarthritis of hip: Secondary | ICD-10-CM | POA: Diagnosis not present

## 2021-12-05 DIAGNOSIS — R1030 Lower abdominal pain, unspecified: Secondary | ICD-10-CM | POA: Diagnosis not present

## 2021-12-05 DIAGNOSIS — M25512 Pain in left shoulder: Secondary | ICD-10-CM | POA: Diagnosis not present

## 2021-12-05 DIAGNOSIS — K644 Residual hemorrhoidal skin tags: Secondary | ICD-10-CM | POA: Insufficient documentation

## 2021-12-05 NOTE — Progress Notes (Signed)
? ? ?  Procedures performed today:   ? ?None. ? ?Independent interpretation of notes and tests performed by another provider:  ? ?None. ? ?Brief History, Exam, Impression, and Recommendations:   ? ?Impingement syndrome, shoulder, left ?Pleasant 62 year old male, got a COVID shot left deltoid, then started to have pain left arm distal to the deltoid, worse with abduction and overhead activities, reaching motions. ?Improving slightly on its own. ?On exam he has impingement signs, positive Neer sign, minimally positive Hawkins sign, negative empty can, negative bicipital and negative labral signs. ?We will treat him conservatively, rotator cuff conditioning, as well as some Thera-Band. ?X-rays. ?Return to see me in a month, subacromial injection if not better. ? ?Left hip pain ?Pain left groin after moving awkwardly. ?He does have some loss of internal rotation but this is not reproductive of concordant pain. ?I think he has more of a hip flexor injury, adding x-rays, hip flexor conditioning, I do expect to see osteoarthritis on his x-rays but again I do not think this is the cause of his pain. ?Return to see me in a month, we will get more aggressive if not better. ? ?External hemorrhoid ?Also having some pain perianal, he felt a hemorrhoid. ?His stools are soft, using witch hazel/Tucks pads and Anusol suppositories. ?We discussed the pathophysiology of hemorrhoids and the fact that they were simply varicose veins of the anus, he will continue to keep his stools soft, he can discontinue Anusol suppositories, continue to use witch hazel/Tucks pads. ?I did advise him and informed that in the future if this flares we need to get to it within a couple of days and I can do a surgical hemorrhoid incision with thrombectomy. ?Of note he had a colonoscopy November 2022. ? ? ? ?___________________________________________ ?Gwen Her. Dianah Field, M.D., ABFM., CAQSM. ?Primary Care and Sports Medicine ?[facility] ? ?Adjunct Instructor of Family Medicine  ?[employer] ?

## 2021-12-05 NOTE — Assessment & Plan Note (Signed)
Pain left groin after moving awkwardly. ?He does have some loss of internal rotation but this is not reproductive of concordant pain. ?I think he has more of a hip flexor injury, adding x-rays, hip flexor conditioning, I do expect to see osteoarthritis on his x-rays but again I do not think this is the cause of his pain. ?Return to see me in a month, we will get more aggressive if not better. ?

## 2021-12-05 NOTE — Assessment & Plan Note (Signed)
Pleasant 61 year old male, got a COVID shot left deltoid, then started to have pain left arm distal to the deltoid, worse with abduction and overhead activities, reaching motions. ?Improving slightly on its own. ?On exam he has impingement signs, positive Neer sign, minimally positive Hawkins sign, negative empty can, negative bicipital and negative labral signs. ?We will treat him conservatively, rotator cuff conditioning, as well as some Thera-Band. ?X-rays. ?Return to see me in a month, subacromial injection if not better. ?

## 2021-12-05 NOTE — Assessment & Plan Note (Addendum)
Also having some pain perianal, he felt a hemorrhoid. ?His stools are soft, using witch hazel/Tucks pads and Anusol suppositories. ?We discussed the pathophysiology of hemorrhoids and the fact that they were simply varicose veins of the anus, he will continue to keep his stools soft, he can discontinue Anusol suppositories, continue to use witch hazel/Tucks pads. ?I did advise him and informed that in the future if this flares we need to get to it within a couple of days and I can do a surgical hemorrhoid incision with thrombectomy. ?Of note he had a colonoscopy November 2022. ?

## 2021-12-16 DIAGNOSIS — L820 Inflamed seborrheic keratosis: Secondary | ICD-10-CM | POA: Diagnosis not present

## 2021-12-16 DIAGNOSIS — L570 Actinic keratosis: Secondary | ICD-10-CM | POA: Diagnosis not present

## 2021-12-16 DIAGNOSIS — D2272 Melanocytic nevi of left lower limb, including hip: Secondary | ICD-10-CM | POA: Diagnosis not present

## 2021-12-16 DIAGNOSIS — R208 Other disturbances of skin sensation: Secondary | ICD-10-CM | POA: Diagnosis not present

## 2021-12-16 DIAGNOSIS — D225 Melanocytic nevi of trunk: Secondary | ICD-10-CM | POA: Diagnosis not present

## 2021-12-16 DIAGNOSIS — D492 Neoplasm of unspecified behavior of bone, soft tissue, and skin: Secondary | ICD-10-CM | POA: Diagnosis not present

## 2021-12-16 DIAGNOSIS — C44719 Basal cell carcinoma of skin of left lower limb, including hip: Secondary | ICD-10-CM | POA: Diagnosis not present

## 2021-12-16 DIAGNOSIS — L821 Other seborrheic keratosis: Secondary | ICD-10-CM | POA: Diagnosis not present

## 2021-12-16 DIAGNOSIS — L538 Other specified erythematous conditions: Secondary | ICD-10-CM | POA: Diagnosis not present

## 2021-12-16 DIAGNOSIS — L298 Other pruritus: Secondary | ICD-10-CM | POA: Diagnosis not present

## 2021-12-16 DIAGNOSIS — D2262 Melanocytic nevi of left upper limb, including shoulder: Secondary | ICD-10-CM | POA: Diagnosis not present

## 2022-01-07 DIAGNOSIS — L570 Actinic keratosis: Secondary | ICD-10-CM | POA: Diagnosis not present

## 2022-01-07 DIAGNOSIS — C44719 Basal cell carcinoma of skin of left lower limb, including hip: Secondary | ICD-10-CM | POA: Diagnosis not present

## 2022-01-09 ENCOUNTER — Ambulatory Visit: Payer: 59 | Admitting: Sports Medicine

## 2022-01-23 ENCOUNTER — Ambulatory Visit (INDEPENDENT_AMBULATORY_CARE_PROVIDER_SITE_OTHER): Payer: 59 | Admitting: Sports Medicine

## 2022-01-23 DIAGNOSIS — M7542 Impingement syndrome of left shoulder: Secondary | ICD-10-CM

## 2022-01-23 DIAGNOSIS — M25552 Pain in left hip: Secondary | ICD-10-CM

## 2022-01-23 NOTE — Progress Notes (Signed)
? ? ?  Procedures performed today:   ? ?None. ? ?Independent interpretation of notes and tests performed by another provider:  ? ?None. ? ?Brief History, Exam, Impression, and Recommendations:   ? ?Impingement syndrome, shoulder, left ?Doing a lot better with home conditioning, return as needed. ? ?Left hip pain ?Osteoarthritis on x-rays, doing better with conditioning, return as needed, injections for this in the shoulder would be the next ? ? ? ?___________________________________________ ?Gwen Her. Dianah Field, M.D., ABFM., CAQSM. ?Primary Care and Sports Medicine ?[facility] ? ?Adjunct Instructor of Family Medicine  ?[employer] ?

## 2022-01-23 NOTE — Assessment & Plan Note (Signed)
Osteoarthritis on x-rays, doing better with conditioning, return as needed, injections for this in the shoulder would be the next ?

## 2022-01-23 NOTE — Assessment & Plan Note (Signed)
Doing a lot better with home conditioning, return as needed. ?

## 2022-02-12 ENCOUNTER — Ambulatory Visit: Payer: 59 | Admitting: Family Medicine

## 2022-03-19 ENCOUNTER — Other Ambulatory Visit: Payer: Self-pay | Admitting: Family Medicine

## 2022-03-19 DIAGNOSIS — R7303 Prediabetes: Secondary | ICD-10-CM

## 2022-03-20 ENCOUNTER — Ambulatory Visit (INDEPENDENT_AMBULATORY_CARE_PROVIDER_SITE_OTHER): Payer: 59 | Admitting: Family Medicine

## 2022-03-20 ENCOUNTER — Encounter: Payer: Self-pay | Admitting: Family Medicine

## 2022-03-20 DIAGNOSIS — E785 Hyperlipidemia, unspecified: Secondary | ICD-10-CM

## 2022-03-20 DIAGNOSIS — R7303 Prediabetes: Secondary | ICD-10-CM

## 2022-03-20 DIAGNOSIS — I10 Essential (primary) hypertension: Secondary | ICD-10-CM

## 2022-03-20 LAB — PROTEIN / CREATININE RATIO, URINE
Creatinine, Urine: 136 mg/dL (ref 20–320)
Protein/Creat Ratio: 44 mg/g creat (ref 25–148)
Protein/Creatinine Ratio: 0.044 mg/mg creat (ref 0.025–0.148)
Total Protein, Urine: 6 mg/dL (ref 5–25)

## 2022-03-20 LAB — HEMOGLOBIN A1C
Hgb A1c MFr Bld: 5.7 % of total Hgb — ABNORMAL HIGH (ref <5.7)
Mean Plasma Glucose: 117 mg/dL
eAG (mmol/L): 6.5 mmol/L

## 2022-03-20 MED ORDER — HYDROCHLOROTHIAZIDE 25 MG PO TABS: 25.0000 mg | ORAL_TABLET | Freq: Every day | ORAL | 1 refills | Status: DC

## 2022-03-20 NOTE — Assessment & Plan Note (Signed)
BP remains well controlled at this time.  Recommend continuation of current medications.

## 2022-03-30 DIAGNOSIS — H2513 Age-related nuclear cataract, bilateral: Secondary | ICD-10-CM | POA: Diagnosis not present

## 2022-03-30 DIAGNOSIS — H40003 Preglaucoma, unspecified, bilateral: Secondary | ICD-10-CM | POA: Diagnosis not present

## 2022-03-30 DIAGNOSIS — E119 Type 2 diabetes mellitus without complications: Secondary | ICD-10-CM | POA: Diagnosis not present

## 2022-04-01 ENCOUNTER — Ambulatory Visit: Payer: 59 | Admitting: Family Medicine

## 2022-04-20 IMAGING — DX DG HIP (WITH OR WITHOUT PELVIS) 2-3V*L*
3 series · 3 of 3 positions shown · non-contrast
Comparison: None.

CLINICAL DATA: Left groin pain.  No injury.

EXAM:
DG HIP (WITH OR WITHOUT PELVIS) 2-3V LEFT

[pelvis ap]
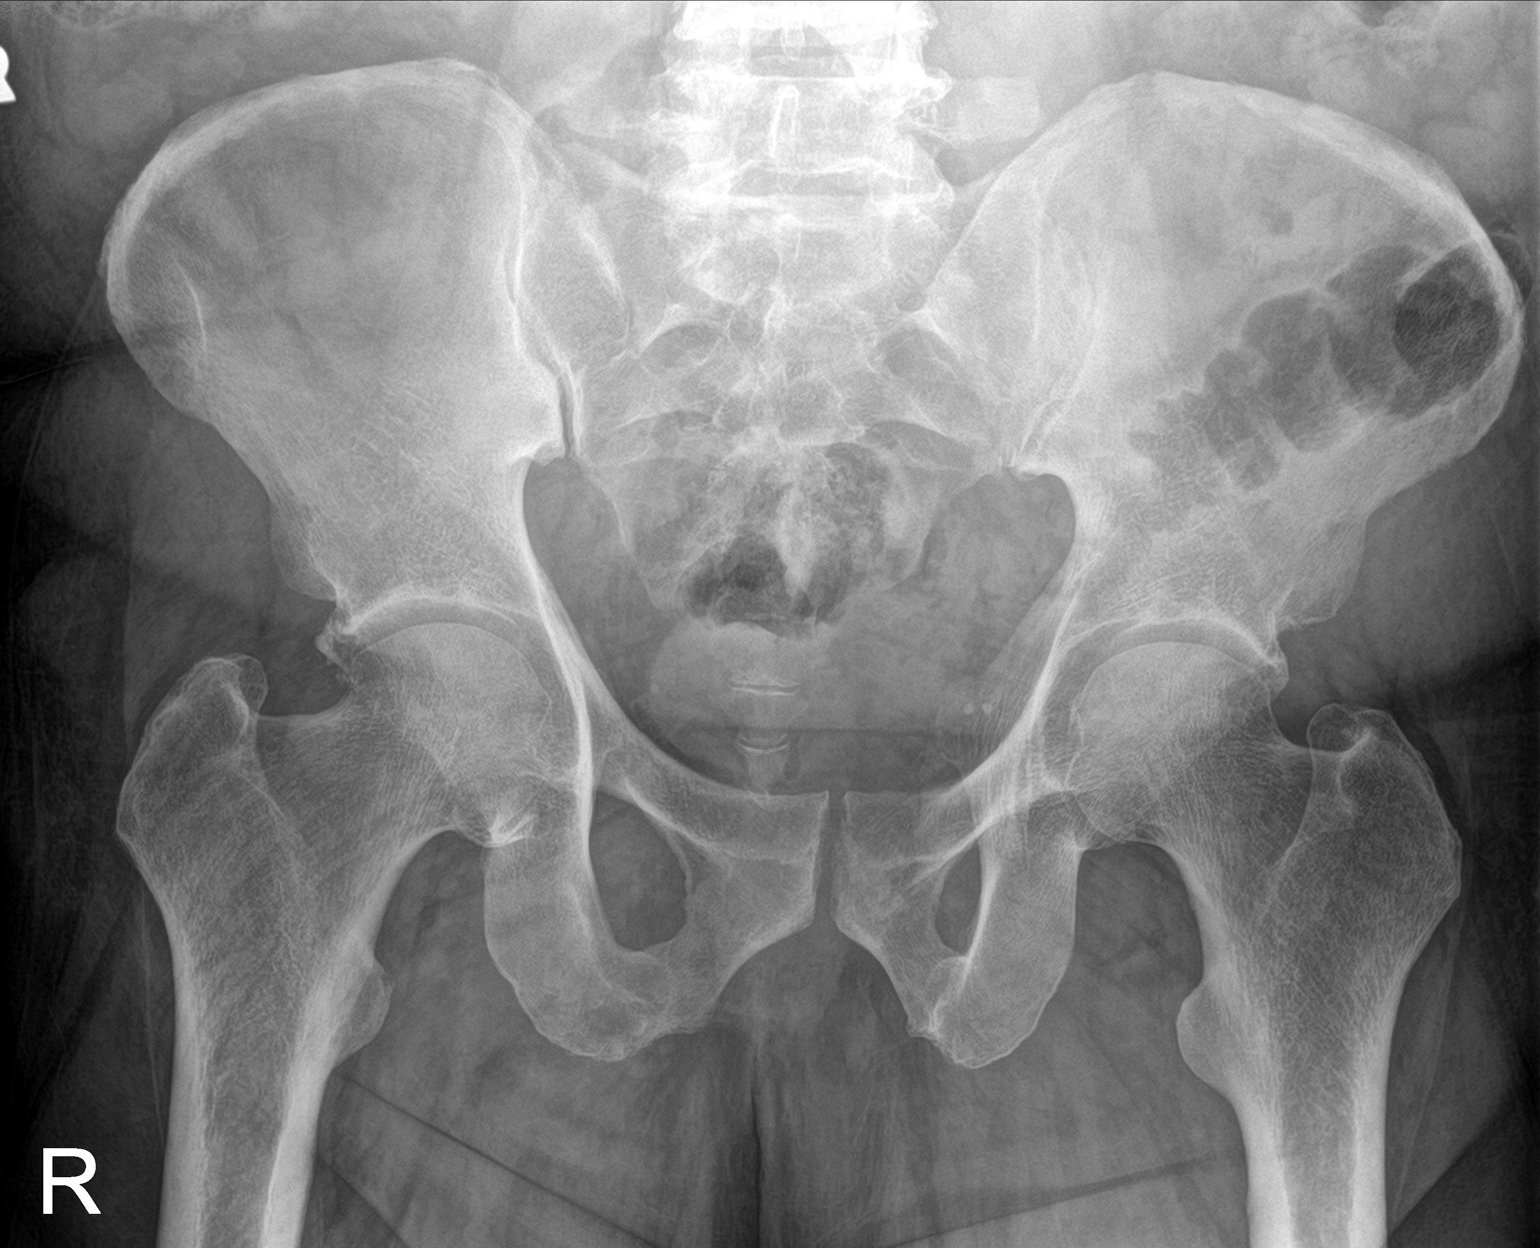

[hip ap]
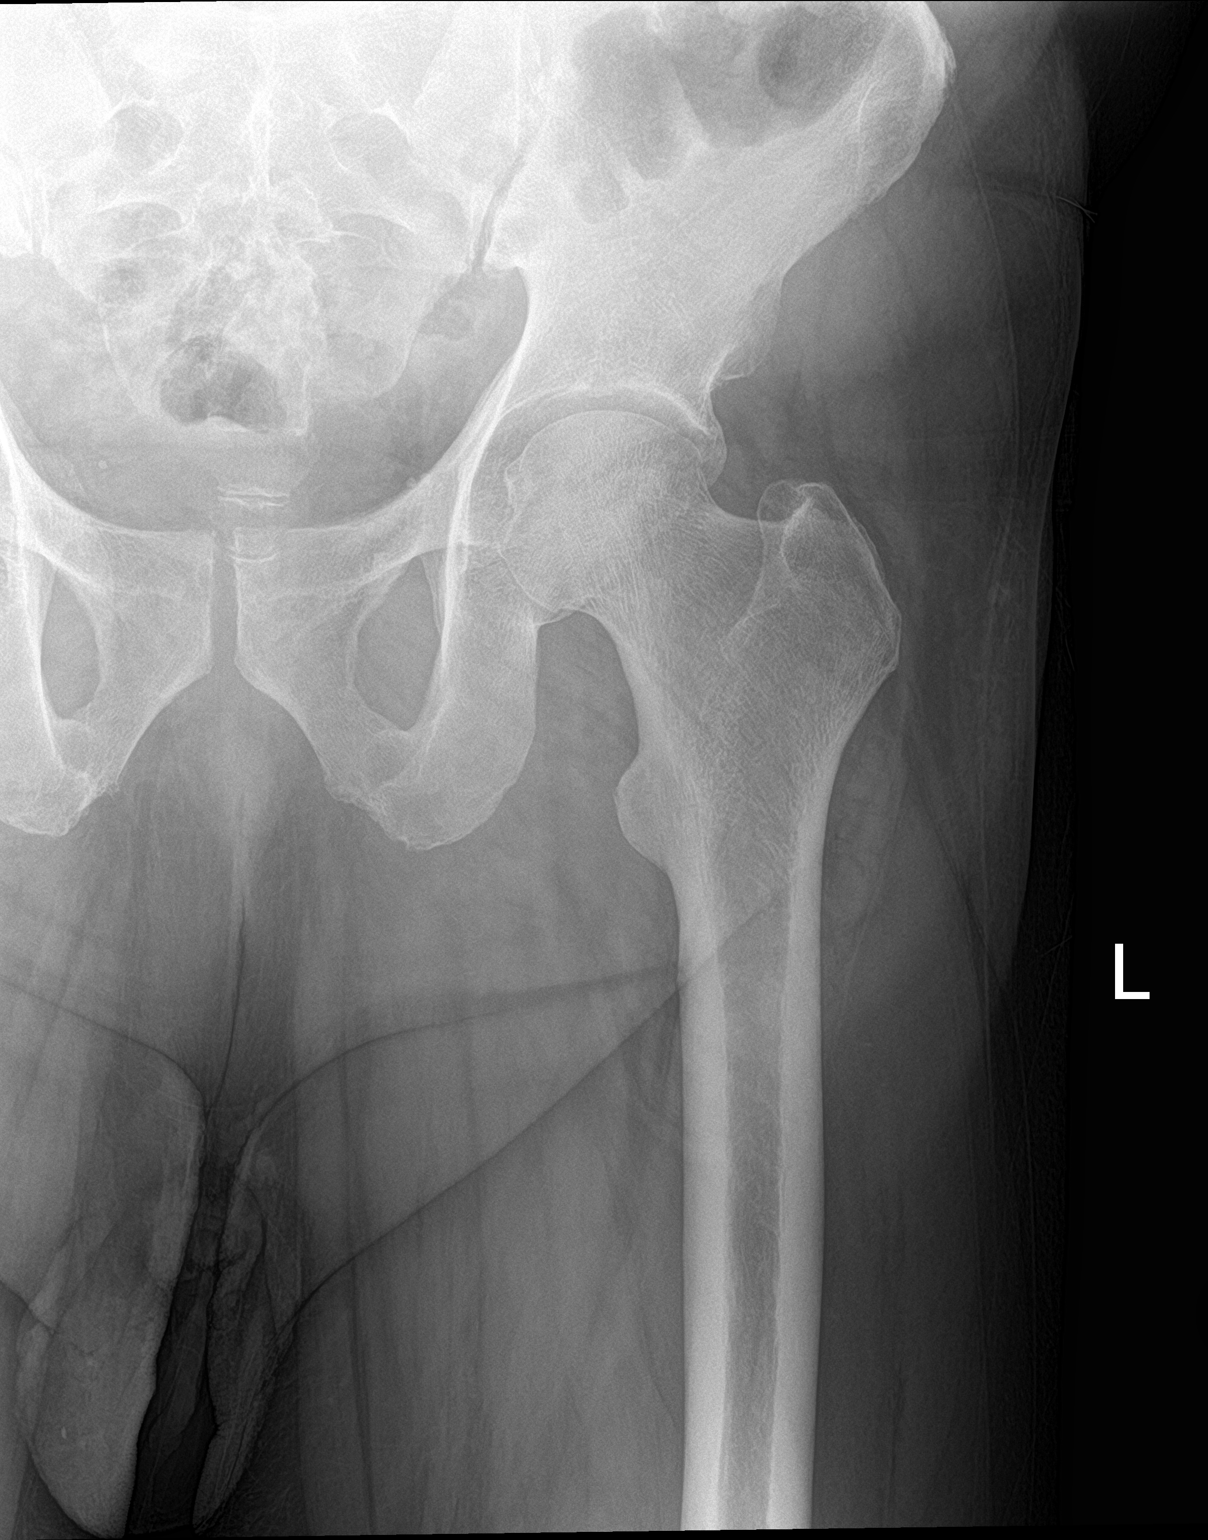

[hip lat]
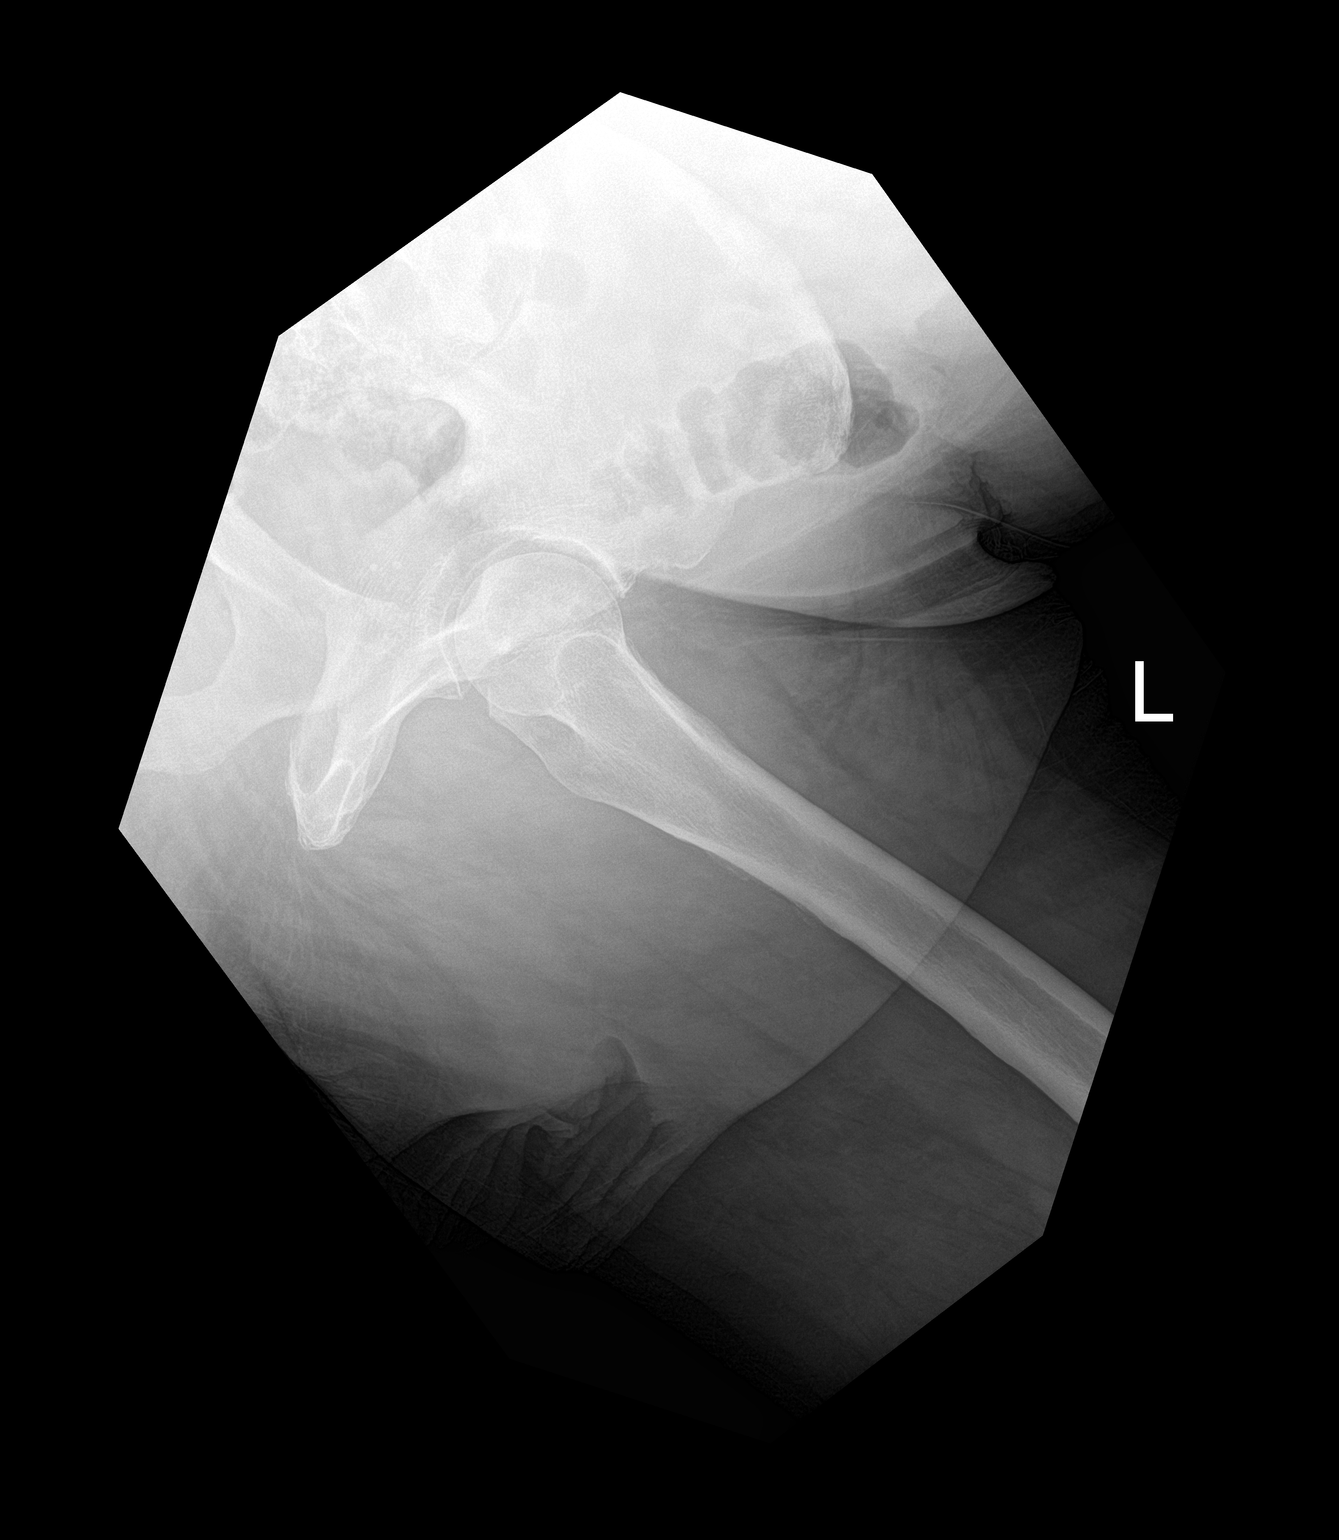

[3 of 3 positions shown; findings below may reference images not displayed]

FINDINGS: Degenerative changes in the hips with acetabular joint space
narrowing, sclerosis, and osteophyte formation more prominent on the
right. No evidence of acute fracture or dislocation involving the
pelvis or left hip. No focal bone lesion or bone destruction. Bone
cortex appears intact. Soft tissues are unremarkable.
IMPRESSION: Degenerative changes in both hips, greater on the right. No acute
bony abnormalities.

## 2022-04-28 ENCOUNTER — Encounter: Payer: Self-pay | Admitting: Family Medicine

## 2022-05-18 ENCOUNTER — Ambulatory Visit: Payer: 59 | Admitting: Sports Medicine

## 2022-05-20 DIAGNOSIS — B078 Other viral warts: Secondary | ICD-10-CM | POA: Diagnosis not present

## 2022-05-20 DIAGNOSIS — R238 Other skin changes: Secondary | ICD-10-CM | POA: Diagnosis not present

## 2022-05-20 DIAGNOSIS — D224 Melanocytic nevi of scalp and neck: Secondary | ICD-10-CM | POA: Diagnosis not present

## 2022-05-20 DIAGNOSIS — L570 Actinic keratosis: Secondary | ICD-10-CM | POA: Diagnosis not present

## 2022-05-22 ENCOUNTER — Ambulatory Visit (INDEPENDENT_AMBULATORY_CARE_PROVIDER_SITE_OTHER): Payer: 59 | Admitting: Sports Medicine

## 2022-05-22 ENCOUNTER — Ambulatory Visit (INDEPENDENT_AMBULATORY_CARE_PROVIDER_SITE_OTHER): Payer: 59

## 2022-05-22 DIAGNOSIS — M25562 Pain in left knee: Secondary | ICD-10-CM | POA: Diagnosis not present

## 2022-05-22 DIAGNOSIS — M1612 Unilateral primary osteoarthritis, left hip: Secondary | ICD-10-CM

## 2022-05-22 DIAGNOSIS — M1711 Unilateral primary osteoarthritis, right knee: Secondary | ICD-10-CM | POA: Diagnosis not present

## 2022-05-22 DIAGNOSIS — Z09 Encounter for follow-up examination after completed treatment for conditions other than malignant neoplasm: Secondary | ICD-10-CM

## 2022-05-22 MED ORDER — DICLOFENAC SODIUM 1 % EX GEL: 4.0000 g | Freq: Four times a day (QID) | CUTANEOUS | 11 refills | Status: DC

## 2022-05-22 NOTE — Assessment & Plan Note (Signed)
Was doing some gardening, hyperflexed left knee, now having pain anterior/lateral left knee. Tender at the joint line, minimal pain with terminal flexion, I think he likely has an anterior horn of the lateral meniscal injury. It is improving on its own and almost no pain today, so we will get some baseline x-rays, have him do some topical Voltaren, and home conditioning and return to see me 6 weeks as needed.

## 2022-05-22 NOTE — Progress Notes (Signed)
    Procedures performed today:    None.  Independent interpretation of notes and tests performed by another provider:   None.  Brief History, Exam, Impression, and Recommendations:    Primary osteoarthritis of left hip Known left hip osteoarthritis, really does not have a lot of pain but some difficulty crossing his legs. Continue home conditioning, if this is persistent and intolerable we will do the injection.  Lateral knee pain, left Was doing some gardening, hyperflexed left knee, now having pain anterior/lateral left knee. Tender at the joint line, minimal pain with terminal flexion, I think he likely has an anterior horn of the lateral meniscal injury. It is improving on its own and almost no pain today, so we will get some baseline x-rays, have him do some topical Voltaren, and home conditioning and return to see me 6 weeks as needed.  Chronic process with exacerbation and pharmacologic intervention  ____________________________________________ Gwen Her. Dianah Field, M.D., ABFM., CAQSM., AME. Primary Care and Sports Medicine [facility]  Adjunct Professor of [employer]  Risk manager

## 2022-05-22 NOTE — Assessment & Plan Note (Signed)
Known left hip osteoarthritis, really does not have a lot of pain but some difficulty crossing his legs. Continue home conditioning, if this is persistent and intolerable we will do the injection.

## 2022-05-27 ENCOUNTER — Other Ambulatory Visit: Payer: Self-pay | Admitting: Osteopathic Medicine

## 2022-07-01 ENCOUNTER — Telehealth: Payer: Self-pay | Admitting: Internal Medicine

## 2022-07-01 NOTE — Telephone Encounter (Signed)
Good Afternoon Dr.Pyrtle,  Supervising MD 9/28 PM  We received records on this patient from [facility]. He is requesting his care to [facility] because his previous provider does not accept his insurance.  We have records for review, please advise on scheduling. Thank you.

## 2022-07-03 ENCOUNTER — Ambulatory Visit: Payer: 59 | Admitting: Sports Medicine

## 2022-07-07 NOTE — Telephone Encounter (Signed)
Patient is calling to follow up on transfer of care request. Would like to schedule before the end of the year.

## 2022-07-08 NOTE — Telephone Encounter (Signed)
Per Dr. Hilarie Fredrickson, patient can be scheduled for direct colon.

## 2022-07-09 ENCOUNTER — Encounter: Payer: Self-pay | Admitting: Internal Medicine

## 2022-07-10 ENCOUNTER — Ambulatory Visit (INDEPENDENT_AMBULATORY_CARE_PROVIDER_SITE_OTHER): Payer: 59 | Admitting: Sports Medicine

## 2022-07-10 ENCOUNTER — Encounter: Payer: Self-pay | Admitting: Sports Medicine

## 2022-07-10 DIAGNOSIS — M1612 Unilateral primary osteoarthritis, left hip: Secondary | ICD-10-CM | POA: Diagnosis not present

## 2022-07-10 DIAGNOSIS — M25562 Pain in left knee: Secondary | ICD-10-CM | POA: Diagnosis not present

## 2022-07-10 NOTE — Assessment & Plan Note (Signed)
Feeling a lot better but still has some loss of motion particularly hip flexion. He is not yet ready to consider injection or formal therapy, but he will let me know, happy to order formal therapy when he desires.

## 2022-07-10 NOTE — Assessment & Plan Note (Signed)
Underlying osteoarthritis, was doing some gardening, hyperflexed knee and had some pain anterior/lateral, suspected anterior horn lateral meniscal injury, this improved with conservative treatment.Marland Kitchen

## 2022-07-10 NOTE — Progress Notes (Signed)
    Procedures performed today:    None.  Independent interpretation of notes and tests performed by another provider:   None.  Brief History, Exam, Impression, and Recommendations:    Lateral knee pain, left Underlying osteoarthritis, was doing some gardening, hyperflexed knee and had some pain anterior/lateral, suspected anterior horn lateral meniscal injury, this improved with conservative treatment..  Primary osteoarthritis of left hip Feeling a lot better but still has some loss of motion particularly hip flexion. He is not yet ready to consider injection or formal therapy, but he will let me know, happy to order formal therapy when he desires.    ____________________________________________ Gwen Her. Dianah Field, M.D., ABFM., CAQSM., AME. Primary Care and Sports Medicine [facility]  Adjunct Professor of [place] [employer] [facility] of [employer]  Risk manager

## 2022-08-10 ENCOUNTER — Ambulatory Visit (AMBULATORY_SURGERY_CENTER): Payer: 59

## 2022-08-10 VITALS — Ht 75.0 in | Wt 275.0 lb

## 2022-08-10 DIAGNOSIS — Z86010 Personal history of colonic polyps: Secondary | ICD-10-CM

## 2022-08-10 DIAGNOSIS — Z800 Family history of malignant neoplasm of digestive organs: Secondary | ICD-10-CM

## 2022-08-10 MED ORDER — NA SULFATE-K SULFATE-MG SULF 17.5-3.13-1.6 GM/177ML PO SOLN: 1.0000 | Freq: Once | ORAL | 0 refills | Status: AC

## 2022-08-10 NOTE — Progress Notes (Signed)
No egg or soy allergy known to patient  No issues known to pt with past sedation with any surgeries or procedures Patient denies ever being told they had issues or difficulty with intubation  No FH of Malignant Hyperthermia Pt is not on diet pills Pt is not on  home 02  Pt is not on blood thinners  Pt denies issues with constipation  No A fib or A flutter  Pt instructed to use Singlecare.com or GoodRx for a price reduction on prep   Patient's chart reviewed by Osvaldo Angst CRNA prior to previsit and patient appropriate for the [place].  Previsit completed and red dot placed by patient's name on their procedure day (on provider's schedule).

## 2022-08-18 ENCOUNTER — Other Ambulatory Visit: Payer: Self-pay | Admitting: Family Medicine

## 2022-09-08 ENCOUNTER — Encounter: Payer: Self-pay | Admitting: Internal Medicine

## 2022-09-08 ENCOUNTER — Encounter: Payer: Self-pay | Admitting: Family Medicine

## 2022-09-10 ENCOUNTER — Other Ambulatory Visit: Payer: Self-pay | Admitting: Family Medicine

## 2022-09-10 DIAGNOSIS — E785 Hyperlipidemia, unspecified: Secondary | ICD-10-CM

## 2022-09-10 DIAGNOSIS — Z125 Encounter for screening for malignant neoplasm of prostate: Secondary | ICD-10-CM

## 2022-09-10 DIAGNOSIS — I10 Essential (primary) hypertension: Secondary | ICD-10-CM

## 2022-09-10 DIAGNOSIS — R7303 Prediabetes: Secondary | ICD-10-CM

## 2022-09-11 ENCOUNTER — Encounter: Payer: Self-pay | Admitting: Internal Medicine

## 2022-09-11 ENCOUNTER — Ambulatory Visit (AMBULATORY_SURGERY_CENTER): Payer: 59 | Admitting: Internal Medicine

## 2022-09-11 VITALS — BP 109/81 | HR 74 | Temp 98.0°F | Resp 15 | Ht 75.0 in | Wt 275.0 lb

## 2022-09-11 DIAGNOSIS — E785 Hyperlipidemia, unspecified: Secondary | ICD-10-CM | POA: Diagnosis not present

## 2022-09-11 DIAGNOSIS — Z800 Family history of malignant neoplasm of digestive organs: Secondary | ICD-10-CM | POA: Diagnosis not present

## 2022-09-11 DIAGNOSIS — Z1211 Encounter for screening for malignant neoplasm of colon: Secondary | ICD-10-CM | POA: Diagnosis not present

## 2022-09-11 DIAGNOSIS — Z125 Encounter for screening for malignant neoplasm of prostate: Secondary | ICD-10-CM | POA: Diagnosis not present

## 2022-09-11 DIAGNOSIS — Z09 Encounter for follow-up examination after completed treatment for conditions other than malignant neoplasm: Secondary | ICD-10-CM | POA: Diagnosis not present

## 2022-09-11 DIAGNOSIS — D124 Benign neoplasm of descending colon: Secondary | ICD-10-CM | POA: Diagnosis not present

## 2022-09-11 DIAGNOSIS — D123 Benign neoplasm of transverse colon: Secondary | ICD-10-CM | POA: Diagnosis not present

## 2022-09-11 DIAGNOSIS — I10 Essential (primary) hypertension: Secondary | ICD-10-CM | POA: Diagnosis not present

## 2022-09-11 DIAGNOSIS — R7303 Prediabetes: Secondary | ICD-10-CM | POA: Diagnosis not present

## 2022-09-11 DIAGNOSIS — Z86010 Personal history of colonic polyps: Secondary | ICD-10-CM

## 2022-09-11 MED ORDER — SODIUM CHLORIDE 0.9 % IV SOLN: 500.0000 mL | INTRAVENOUS | Status: DC

## 2022-09-11 NOTE — Progress Notes (Signed)
Called to room to assist during endoscopic procedure.  Patient ID and intended procedure confirmed with present staff. Received instructions for my participation in the procedure from the performing physician.  

## 2022-09-11 NOTE — Op Note (Signed)
[name]: James Ware Procedure Date: 09/11/2022 8:02 AM MRN: [id number] Endoscopist: Jerene Bears , MD, [id number] Age: 61 Referring MD:  Date of Birth: 10/08/1960 Gender: Male Account #: [id number] Procedure:                Colonoscopy Indications:              High risk colon cancer surveillance: Personal                            history of non-advanced adenoma, Family history of                            colon cancer in a first-degree relative before                            after 67 years, Last colonoscopy 1 year ago (less                            than ideal prep with Dr. Glennon Hamilton) Medicines:                Monitored Anesthesia Care Procedure:                Pre-Anesthesia Assessment:                           - Prior to the procedure, a History and Physical                            was performed, and patient medications and                            allergies were reviewed. The patient's tolerance of                            previous anesthesia was also reviewed. The risks                            and benefits of the procedure and the sedation                            options and risks were discussed with the patient.                            All questions were answered, and informed consent                            was obtained. Prior Anticoagulants: The patient has                            taken no anticoagulant or antiplatelet agents. ASA                            Grade Assessment: II - A patient with mild systemic  disease. After reviewing the risks and benefits,                            the patient was deemed in satisfactory condition to                            undergo the procedure.                           After obtaining informed consent, the colonoscope                            was passed under direct vision. Throughout the                            procedure, the patient's blood pressure,  pulse, and                            oxygen saturations were monitored continuously. The                            CF [id number] #[id number] was introduced through the anus                            and advanced to the cecum, identified by                            appendiceal orifice and ileocecal valve. The                            colonoscopy was performed without difficulty. The                            patient tolerated the procedure well. The quality                            of the bowel preparation was good. The ileocecal                            valve, appendiceal orifice, and rectum were                            photographed. Scope In: 8:05:54 AM Scope Out: 8:19:54 AM Scope Withdrawal Time: 0 hours 11 minutes 49 seconds  Total Procedure Duration: 0 hours 14 minutes 0 seconds  Findings:                 The digital rectal exam was normal.                           A 7 mm polyp was found in the transverse colon. The                            polyp was sessile. The polyp was removed with a  cold snare. Resection and retrieval were complete.                           A 5 mm polyp was found in the descending colon. The                            polyp was sessile. The polyp was removed with a                            cold snare. Resection and retrieval were complete.                           Multiple large-mouthed and small-mouthed                            diverticula were found from ascending colon to                            sigmoid colon.                           Internal hemorrhoids were found during                            retroflexion. The hemorrhoids were small. Complications:            No immediate complications. Estimated Blood Loss:     Estimated blood loss was minimal. Impression:               - One 7 mm polyp in the transverse colon, removed                            with a cold snare. Resected and retrieved.                            - One 5 mm polyp in the descending colon, removed                            with a cold snare. Resected and retrieved.                           - Moderate diverticulosis from ascending colon to                            sigmoid colon.                           - Internal hemorrhoids. Recommendation:           - Patient has a contact number available for                            emergencies. The signs and symptoms of potential                            delayed complications  were discussed with the                            patient. Return to normal activities tomorrow.                            Written discharge instructions were provided to the                            patient.                           - Resume previous diet.                           - Continue present medications.                           - Await pathology results.                           - Repeat colonoscopy in 5 years for surveillance. Jerene Bears, MD 09/11/2022 8:22:41 AM This report has been signed electronically.

## 2022-09-11 NOTE — Patient Instructions (Signed)
   Handouts on polyps,diverticulosis ,& hemorrhoids given to you today  Await [pathology results on polyps removed    YOU HAD AN ENDOSCOPIC PROCEDURE TODAY AT [facility]:   Refer to the procedure report that was given to you for any specific questions about what was found during the examination.  If the procedure report does not answer your questions, please call your gastroenterologist to clarify.  If you requested that your care partner not be given the details of your procedure findings, then the procedure report has been included in a sealed envelope for you to review at your convenience later.  YOU SHOULD EXPECT: Some feelings of bloating in the abdomen. Passage of more gas than usual.  Walking can help get rid of the air that was put into your GI tract during the procedure and reduce the bloating. If you had a lower endoscopy (such as a colonoscopy or flexible sigmoidoscopy) you may notice spotting of blood in your stool or on the toilet paper. If you underwent a bowel prep for your procedure, you may not have a normal bowel movement for a few days.  Please Note:  You might notice some irritation and congestion in your nose or some drainage.  This is from the oxygen used during your procedure.  There is no need for concern and it should clear up in a day or so.  SYMPTOMS TO REPORT IMMEDIATELY:  Following lower endoscopy (colonoscopy or flexible sigmoidoscopy):  Excessive amounts of blood in the stool  Significant tenderness or worsening of abdominal pains  Swelling of the abdomen that is new, acute  Fever of 100F or higher    For urgent or emergent issues, a gastroenterologist can be reached at any hour by calling [phone]. Do not use MyChart messaging for urgent concerns.    DIET:  We do recommend a small meal at first, but then you may proceed to your regular diet.  Drink plenty of fluids but you should avoid alcoholic beverages for 24 hours.  ACTIVITY:   You should plan to take it easy for the rest of today and you should NOT DRIVE or use heavy machinery until tomorrow (because of the sedation medicines used during the test).    FOLLOW UP: Our staff will call the number listed on your records the next business day following your procedure.  We will call around 7:15- 8:00 am to check on you and address any questions or concerns that you may have regarding the information given to you following your procedure. If we do not reach you, we will leave a message.     If any biopsies were taken you will be contacted by phone or by letter within the next 1-3 weeks.  Please call us at [phone] if you have not heard about the biopsies in 3 weeks.    SIGNATURES/CONFIDENTIALITY: You and/or your care partner have signed paperwork which will be entered into your electronic medical record.  These signatures attest to the fact that that the information above on your After Visit Summary has been reviewed and is understood.  Full responsibility of the confidentiality of this discharge information lies with you and/or your care-partner.

## 2022-09-11 NOTE — Progress Notes (Signed)
Sedate, gd SR, tolerated procedure well, VSS, report to RN 

## 2022-09-11 NOTE — Progress Notes (Signed)
Patient reports to changes to health or mediations since pre visit.

## 2022-09-11 NOTE — Progress Notes (Signed)
GASTROENTEROLOGY PROCEDURE H&P NOTE   Primary Care Physician: Luetta Nutting, DO    Reason for Procedure:   Hx of polyps, borderline prep 1 yr ago, fam hx colon cancer  Plan:    colonoscopy  Patient is appropriate for endoscopic procedure(s) in the ambulatory ([place]) setting.  The nature of the procedure, as well as the risks, benefits, and alternatives were carefully and thoroughly reviewed with the patient. Ample time for discussion and questions allowed. The patient understood, was satisfied, and agreed to proceed.     HPI: James Ware is a 61 y.o. male who presents for colonoscopy.  Medical history as below.  Tolerated the prep.  No recent chest pain or shortness of breath.  No abdominal pain today.  Past Medical History:  Diagnosis Date   Arthritis    left knee   Eczema 05/24/2017   Essential hypertension 05/24/2017   Hyperlipidemia    Prediabetes 05/24/2017   Venous stasis dermatitis of both lower extremities 05/24/2017    Past Surgical History:  Procedure Laterality Date   COLONOSCOPY     TONSILLECTOMY     in first grade   TYMPANOSTOMY TUBE PLACEMENT     as a child   WISDOM TOOTH EXTRACTION      Prior to Admission medications   Medication Sig Start Date End Date Taking? Authorizing Provider  Ascorbic Acid (VITAMIN C) 1000 MG tablet Take 1,000 mg by mouth daily.   Yes [provider]  ASHWAGANDHA PO Take by mouth.   Yes [provider]  Cholecalciferol (VITAMIN D-3) 125 MCG (5000 UT) TABS Take by mouth.   Yes [provider]  hydrochlorothiazide (HYDRODIURIL) 25 MG tablet Take 1 tablet (25 mg total) by mouth daily. 03/20/22  Yes Luetta Nutting, DO  Krill Oil 300 MG CAPS Take 300 mg by mouth daily.   Yes [provider]  rosuvastatin (CRESTOR) 10 MG tablet TAKE 1 TABLET BY MOUTH EVERY DAY 08/19/22  Yes Luetta Nutting, DO  zinc gluconate 50 MG tablet Take 50 mg by mouth daily.   Yes [provider]  diclofenac  Sodium (VOLTAREN) 1 % GEL Apply 4 g topically 4 (four) times daily. To affected joint. 05/22/22   Silverio Decamp, MD  Multiple Vitamin (MULTIVITAMIN) tablet Take by mouth.    [provider]  NONFORMULARY OR COMPOUNDED ITEM [facility]:  Antifungal Topical - Terbinafine 3%, Fluconazole 2%, Tea Tree Oil 10%, Ibuprofen 2%, in DMSO Suspension #58m. Apply to affected toenail(s) once (at bedtime) or twice daily. 01/16/20   EBronson Ing DPM  Probiotic Product (PROBIOTIC ACIDOPHILUS BEADS) CAPS Take by mouth.    [provider]  tadalafil (CIALIS) 20 MG tablet TAKE  1/2 TO 1 TABLET BY MOUTH EVERY DAY AS NEEDED FOR ERECTILE DYSFUNCTION 06/10/22   MLuetta Nutting DO    Current Outpatient Medications  Medication Sig Dispense Refill   Ascorbic Acid (VITAMIN C) 1000 MG tablet Take 1,000 mg by mouth daily.     ASHWAGANDHA PO Take by mouth.     Cholecalciferol (VITAMIN D-3) 125 MCG (5000 UT) TABS Take by mouth.     hydrochlorothiazide (HYDRODIURIL) 25 MG tablet Take 1 tablet (25 mg total) by mouth daily. 90 tablet 1   Krill Oil 300 MG CAPS Take 300 mg by mouth daily.     rosuvastatin (CRESTOR) 10 MG tablet TAKE 1 TABLET BY MOUTH EVERY DAY 90 tablet 3   zinc gluconate 50 MG tablet Take 50 mg by mouth daily.  diclofenac Sodium (VOLTAREN) 1 % GEL Apply 4 g topically 4 (four) times daily. To affected joint. 100 g 11   Multiple Vitamin (MULTIVITAMIN) tablet Take by mouth.     NONFORMULARY OR COMPOUNDED ITEM [facility]:  Antifungal Topical - Terbinafine 3%, Fluconazole 2%, Tea Tree Oil 10%, Ibuprofen 2%, in DMSO Suspension #2m. Apply to affected toenail(s) once (at bedtime) or twice daily. 30 each 3   Probiotic Product (PROBIOTIC ACIDOPHILUS BEADS) CAPS Take by mouth.     tadalafil (CIALIS) 20 MG tablet TAKE  1/2 TO 1 TABLET BY MOUTH EVERY DAY AS NEEDED FOR ERECTILE DYSFUNCTION 10 tablet 0   Current Facility-Administered Medications  Medication Dose Route  Frequency Provider Last Rate Last Admin   0.9 %  sodium chloride infusion  500 mL Intravenous Continuous Klarisa Barman, JLajuan Lines MD        Allergies as of 09/11/2022   (No Known Allergies)    Family History  Problem Relation Age of Onset   Colon cancer Mother 829  Hyperlipidemia Mother    Cancer Father        bladder   Esophageal cancer Neg Hx    Rectal cancer Neg Hx    Stomach cancer Neg Hx     Social History   Socioeconomic History   Marital status: Divorced    Spouse name: Not on file   Number of children: Not on file   Years of education: Not on file   Highest education level: Not on file  Occupational History   Not on file  Tobacco Use   Smoking status: Former   Smokeless tobacco: Never  Vaping Use   Vaping Use: Never used  Substance and Sexual Activity   Alcohol use: Yes    Comment: 1-2 drinks per week   Drug use: Never   Sexual activity: Not on file  Other Topics Concern   Not on file  Social History Narrative   Not on file   Social Determinants of Health   Financial Resource Strain: Not on file  Food Insecurity: Not on file  Transportation Needs: Not on file  Physical Activity: Not on file  Stress: Not on file  Social Connections: Not on file  Intimate Partner Violence: Not on file    Physical Exam: Vital signs in last 24 hours: '@BP'$  (!) 143/71   Pulse 85   Temp 98 F (36.7 C)   Ht '6\' 3"'$  (1.905 m)   Wt 275 lb (124.7 kg)   SpO2 98%   BMI 34.37 kg/m  GEN: NAD EYE: Sclerae anicteric ENT: MMM CV: Non-tachycardic Pulm: CTA b/l GI: Soft, NT/ND NEURO:  Alert & Oriented x 3   JZenovia Jarred MD [facility]  09/11/2022 8:00 AM

## 2022-09-12 LAB — CBC WITH DIFFERENTIAL/PLATELET
Absolute Monocytes: 689 cells/uL (ref 200–950)
Basophils Absolute: 49 cells/uL (ref 0–200)
Basophils Relative: 0.6 %
Eosinophils Absolute: 130 cells/uL (ref 15–500)
Eosinophils Relative: 1.6 %
HCT: 44.5 % (ref 38.5–50.0)
Hemoglobin: 15.4 g/dL (ref 13.2–17.1)
Lymphs Abs: 2357 cells/uL (ref 850–3900)
MCH: 31.8 pg (ref 27.0–33.0)
MCHC: 34.6 g/dL (ref 32.0–36.0)
MCV: 91.9 fL (ref 80.0–100.0)
MPV: 10.6 fL (ref 7.5–12.5)
Monocytes Relative: 8.5 %
Neutro Abs: 4876 cells/uL (ref 1500–7800)
Neutrophils Relative %: 60.2 %
Platelets: 148 10*3/uL (ref 140–400)
RBC: 4.84 10*6/uL (ref 4.20–5.80)
RDW: 12.5 % (ref 11.0–15.0)
Total Lymphocyte: 29.1 %
WBC: 8.1 10*3/uL (ref 3.8–10.8)

## 2022-09-12 LAB — COMPLETE METABOLIC PANEL WITH GFR
AG Ratio: 1.5 (calc) (ref 1.0–2.5)
ALT: 32 U/L (ref 9–46)
AST: 23 U/L (ref 10–35)
Albumin: 4.4 g/dL (ref 3.6–5.1)
Alkaline phosphatase (APISO): 74 U/L (ref 35–144)
BUN: 12 mg/dL (ref 7–25)
CO2: 30 mmol/L (ref 20–32)
Calcium: 9.5 mg/dL (ref 8.6–10.3)
Chloride: 100 mmol/L (ref 98–110)
Creat: 0.76 mg/dL (ref 0.70–1.35)
Globulin: 2.9 g/dL (calc) (ref 1.9–3.7)
Glucose, Bld: 94 mg/dL (ref 65–99)
Potassium: 3.5 mmol/L (ref 3.5–5.3)
Sodium: 140 mmol/L (ref 135–146)
Total Bilirubin: 1.2 mg/dL (ref 0.2–1.2)
Total Protein: 7.3 g/dL (ref 6.1–8.1)
eGFR: 101 mL/min/{1.73_m2} (ref >=60)

## 2022-09-12 LAB — LIPID PANEL W/REFLEX DIRECT LDL
Cholesterol: 114 mg/dL (ref <200)
HDL: 34 mg/dL — ABNORMAL LOW (ref >=40)
LDL Cholesterol (Calc): 54 mg/dL (calc)
Non-HDL Cholesterol (Calc): 80 mg/dL (calc) (ref <130)
Total CHOL/HDL Ratio: 3.4 (calc) (ref <5.0)
Triglycerides: 179 mg/dL — ABNORMAL HIGH (ref <150)

## 2022-09-12 LAB — MICROALBUMIN / CREATININE URINE RATIO
Creatinine, Urine: 61 mg/dL (ref 20–320)
Microalb, Ur: <0.2 mg/dL

## 2022-09-12 LAB — HEMOGLOBIN A1C
Hgb A1c MFr Bld: 7.3 % of total Hgb — ABNORMAL HIGH (ref <5.7)
Mean Plasma Glucose: 163 mg/dL
eAG (mmol/L): 9 mmol/L

## 2022-09-12 LAB — PSA: PSA: 0.36 ng/mL (ref <=4.00)

## 2022-09-14 ENCOUNTER — Telehealth: Payer: Self-pay | Admitting: Internal Medicine

## 2022-09-14 NOTE — Telephone Encounter (Signed)
  Follow up Call-     09/11/2022    7:22 AM  Call back number  Post procedure Call Back phone  # [phone]  Permission to leave phone message Yes     Patient questions:  Do you have a fever, pain , or abdominal swelling? No. Pain Score  0 *  Have you tolerated food without any problems? Yes.    Have you been able to return to your normal activities? Yes.    Do you have any questions about your discharge instructions: Diet   No. Medications  No. Follow up visit  No.  Do you have questions or concerns about your Care? Yes, Patient was wondering if  picture #4 that was labeled "polyp" why it was not taken off. After discussing with the MD that was a mislabeled picture, it was a picture of a diverticulum not a polyp. Will let the patient know and clarify with him.   Actions: * If pain score is 4 or above: No action needed, pain <4.

## 2022-09-14 NOTE — Telephone Encounter (Signed)
Patient is calling states he was supposed to receive a follow up call this morning and is awaiting the call also states he has some questions regarding his procedure. Please advise

## 2022-09-17 ENCOUNTER — Encounter: Payer: Self-pay | Admitting: Internal Medicine

## 2022-09-17 ENCOUNTER — Ambulatory Visit (INDEPENDENT_AMBULATORY_CARE_PROVIDER_SITE_OTHER): Payer: 59 | Admitting: Family Medicine

## 2022-09-17 ENCOUNTER — Encounter: Payer: Self-pay | Admitting: Family Medicine

## 2022-09-17 VITALS — BP 138/92 | HR 92 | Ht 75.0 in | Wt 283.0 lb

## 2022-09-17 DIAGNOSIS — Z0000 Encounter for general adult medical examination without abnormal findings: Secondary | ICD-10-CM

## 2022-09-17 NOTE — Assessment & Plan Note (Signed)
Well adult No orders of the defined types were placed in this encounter. Screenings: UTD Immunizations UTD Anticipatory guidance/Risk factor reduction:  recommendations per AVS.  Recommend increased activity for weight loss and improvement of A1c.

## 2022-09-17 NOTE — Progress Notes (Signed)
James Ware - 61 y.o. male MRN [id number]  Date of birth: Nov 06, 1960  Subjective Chief Complaint  Patient presents with   Annual Exam    HPI James Ware is a 61 y.o. male here today for annual exam.   He reports that he is feeling well.  Had labs completed prior to visit. We reviewed these in detail today.   Activity and diet could be better.  Less active due to knee pain as well as taking time to care for his mother.     Non-smoker.  Occasional EtOH use.   He is up to date on immunizations.   Review of Systems  Constitutional:  Negative for chills, fever, malaise/fatigue and weight loss.  HENT:  Negative for congestion, ear pain and sore throat.   Eyes:  Negative for blurred vision, double vision and pain.  Respiratory:  Negative for cough and shortness of breath.   Cardiovascular:  Negative for chest pain and palpitations.  Gastrointestinal:  Negative for abdominal pain, blood in stool, constipation, heartburn and nausea.  Genitourinary:  Negative for dysuria and urgency.  Musculoskeletal:  Negative for joint pain and myalgias.  Neurological:  Negative for dizziness and headaches.  Endo/Heme/Allergies:  Does not bruise/bleed easily.  Psychiatric/Behavioral:  Negative for depression. The patient is not nervous/anxious and does not have insomnia.      No Known Allergies  Past Medical History:  Diagnosis Date   Arthritis    left knee   Eczema 05/24/2017   Essential hypertension 05/24/2017   Hyperlipidemia    Prediabetes 05/24/2017   Venous stasis dermatitis of both lower extremities 05/24/2017    Past Surgical History:  Procedure Laterality Date   COLONOSCOPY     TONSILLECTOMY     in first grade   TYMPANOSTOMY TUBE PLACEMENT     as a child   WISDOM TOOTH EXTRACTION      Social History   Socioeconomic History   Marital status: Divorced    Spouse name: Not on file   Number of children: Not on file   Years of education: Not on file   Highest education  level: Not on file  Occupational History   Not on file  Tobacco Use   Smoking status: Former   Smokeless tobacco: Never  Vaping Use   Vaping Use: Never used  Substance and Sexual Activity   Alcohol use: Yes    Comment: 1-2 drinks per week   Drug use: Never   Sexual activity: Not on file  Other Topics Concern   Not on file  Social History Narrative   Not on file   Social Determinants of Health   Financial Resource Strain: Not on file  Food Insecurity: Not on file  Transportation Needs: Not on file  Physical Activity: Not on file  Stress: Not on file  Social Connections: Not on file    Family History  Problem Relation Age of Onset   Colon cancer Mother 81   Hyperlipidemia Mother    Cancer Father        bladder   Esophageal cancer Neg Hx    Rectal cancer Neg Hx    Stomach cancer Neg Hx     Health Maintenance  Topic Date Due   OPHTHALMOLOGY EXAM  08/28/2022   HIV Screening  03/21/2023 (Originally 04/26/1974)   COVID-19 Vaccine (7 - 2023-24 season) 09/30/2022   HEMOGLOBIN A1C  03/13/2023   Diabetic kidney evaluation - eGFR measurement  09/12/2023   Diabetic kidney evaluation - Urine ACR  09/12/2023   COLONOSCOPY (Pts 45-42yr Insurance coverage will need to be confirmed)  09/12/2023   FOOT EXAM  09/18/2023   DTaP/Tdap/Td (3 - Td or Tdap) 10/31/2030   INFLUENZA VACCINE  Completed   Hepatitis C Screening  Completed   Zoster Vaccines- Shingrix  Completed   HPV VACCINES  Aged Out     ----------------------------------------------------------------------------------------------------------------------------------------------------------------------------------------------------------------- Physical Exam BP (!) 138/92 (BP Location: Left Arm, Patient Position: Sitting, Cuff Size: Large)   Pulse 92   Ht _0  (1.905 m)   Wt 283 lb (128.4 kg)   SpO2 99%   BMI 35.37 kg/m   Physical Exam Constitutional:      General: He is not in acute distress. HENT:     Head:  Normocephalic and atraumatic.     Right Ear: Tympanic membrane and external ear normal.     Left Ear: Tympanic membrane and external ear normal.  Eyes:     General: No scleral icterus. Neck:     Thyroid: No thyromegaly.  Cardiovascular:     Rate and Rhythm: Normal rate and regular rhythm.     Heart sounds: Normal heart sounds.  Pulmonary:     Effort: Pulmonary effort is normal.     Breath sounds: Normal breath sounds.  Abdominal:     General: Bowel sounds are normal. There is no distension.     Palpations: Abdomen is soft.     Tenderness: There is no abdominal tenderness. There is no guarding.  Musculoskeletal:     Cervical back: Normal range of motion.  Lymphadenopathy:     Cervical: No cervical adenopathy.  Skin:    General: Skin is warm and dry.     Findings: No rash.  Neurological:     Mental Status: He is alert and oriented to person, place, and time.     Cranial Nerves: No cranial nerve deficit.     Motor: No abnormal muscle tone.  Psychiatric:        Mood and Affect: Mood normal.        Behavior: Behavior normal.     ------------------------------------------------------------------------------------------------------------------------------------------------------------------------------------------------------------------- Assessment and Plan  Well adult exam Well adult No orders of the defined types were placed in this encounter. Screenings: UTD Immunizations UTD Anticipatory guidance/Risk factor reduction:  recommendations per AVS.  Recommend increased activity for weight loss and improvement of A1c.     No orders of the defined types were placed in this encounter.   Return in about 3 months (around 12/17/2022) for F/u blood sugar.    This visit occurred during the SARS-CoV-2 public health emergency.  Safety protocols were in place, including screening questions prior to the visit, additional usage of staff PPE, and extensive cleaning of exam room while  observing appropriate contact time as indicated for disinfecting solutions.

## 2022-09-17 NOTE — Patient Instructions (Signed)

## 2022-10-09 LAB — HM DIABETES EYE EXAM

## 2022-10-22 ENCOUNTER — Encounter: Payer: Self-pay | Admitting: Family Medicine

## 2022-11-30 DIAGNOSIS — I10 Essential (primary) hypertension: Secondary | ICD-10-CM | POA: Diagnosis not present

## 2022-11-30 DIAGNOSIS — S82002A Unspecified fracture of left patella, initial encounter for closed fracture: Secondary | ICD-10-CM | POA: Diagnosis not present

## 2022-11-30 DIAGNOSIS — E876 Hypokalemia: Secondary | ICD-10-CM | POA: Diagnosis not present

## 2022-11-30 DIAGNOSIS — M25532 Pain in left wrist: Secondary | ICD-10-CM | POA: Diagnosis not present

## 2022-11-30 DIAGNOSIS — Z6834 Body mass index (BMI) 34.0-34.9, adult: Secondary | ICD-10-CM | POA: Diagnosis not present

## 2022-11-30 DIAGNOSIS — S8992XA Unspecified injury of left lower leg, initial encounter: Secondary | ICD-10-CM | POA: Diagnosis not present

## 2022-11-30 DIAGNOSIS — E7849 Other hyperlipidemia: Secondary | ICD-10-CM | POA: Diagnosis not present

## 2022-11-30 DIAGNOSIS — S82042A Displaced comminuted fracture of left patella, initial encounter for closed fracture: Secondary | ICD-10-CM | POA: Diagnosis not present

## 2022-11-30 DIAGNOSIS — W01198A Fall on same level from slipping, tripping and stumbling with subsequent striking against other object, initial encounter: Secondary | ICD-10-CM | POA: Diagnosis not present

## 2022-11-30 DIAGNOSIS — T1490XA Injury, unspecified, initial encounter: Secondary | ICD-10-CM | POA: Diagnosis not present

## 2022-11-30 DIAGNOSIS — S82092A Other fracture of left patella, initial encounter for closed fracture: Secondary | ICD-10-CM | POA: Diagnosis not present

## 2022-11-30 DIAGNOSIS — Z79899 Other long term (current) drug therapy: Secondary | ICD-10-CM | POA: Diagnosis not present

## 2022-11-30 DIAGNOSIS — M25562 Pain in left knee: Secondary | ICD-10-CM | POA: Diagnosis not present

## 2022-11-30 DIAGNOSIS — H9190 Unspecified hearing loss, unspecified ear: Secondary | ICD-10-CM | POA: Diagnosis not present

## 2022-11-30 DIAGNOSIS — G8918 Other acute postprocedural pain: Secondary | ICD-10-CM | POA: Diagnosis not present

## 2022-11-30 DIAGNOSIS — S60512A Abrasion of left hand, initial encounter: Secondary | ICD-10-CM | POA: Diagnosis not present

## 2022-11-30 DIAGNOSIS — E669 Obesity, unspecified: Secondary | ICD-10-CM | POA: Diagnosis not present

## 2022-11-30 DIAGNOSIS — Y9301 Activity, walking, marching and hiking: Secondary | ICD-10-CM | POA: Diagnosis not present

## 2022-11-30 DIAGNOSIS — S8332XA Tear of articular cartilage of left knee, current, initial encounter: Secondary | ICD-10-CM | POA: Diagnosis not present

## 2022-11-30 DIAGNOSIS — M79642 Pain in left hand: Secondary | ICD-10-CM | POA: Diagnosis not present

## 2022-11-30 DIAGNOSIS — S60511A Abrasion of right hand, initial encounter: Secondary | ICD-10-CM | POA: Diagnosis not present

## 2022-11-30 DIAGNOSIS — J9811 Atelectasis: Secondary | ICD-10-CM | POA: Diagnosis not present

## 2022-11-30 DIAGNOSIS — W101XXA Fall (on)(from) sidewalk curb, initial encounter: Secondary | ICD-10-CM | POA: Diagnosis not present

## 2022-11-30 DIAGNOSIS — Z7982 Long term (current) use of aspirin: Secondary | ICD-10-CM | POA: Diagnosis not present

## 2022-11-30 DIAGNOSIS — J302 Other seasonal allergic rhinitis: Secondary | ICD-10-CM | POA: Diagnosis not present

## 2022-11-30 DIAGNOSIS — R7303 Prediabetes: Secondary | ICD-10-CM | POA: Diagnosis not present

## 2022-11-30 DIAGNOSIS — R9431 Abnormal electrocardiogram [ECG] [EKG]: Secondary | ICD-10-CM | POA: Diagnosis not present

## 2022-11-30 DIAGNOSIS — M7989 Other specified soft tissue disorders: Secondary | ICD-10-CM | POA: Diagnosis not present

## 2022-11-30 DIAGNOSIS — Z01818 Encounter for other preprocedural examination: Secondary | ICD-10-CM | POA: Diagnosis not present

## 2022-11-30 DIAGNOSIS — S0990XA Unspecified injury of head, initial encounter: Secondary | ICD-10-CM | POA: Diagnosis not present

## 2022-11-30 DIAGNOSIS — S6992XA Unspecified injury of left wrist, hand and finger(s), initial encounter: Secondary | ICD-10-CM | POA: Diagnosis not present

## 2022-11-30 DIAGNOSIS — T502X5A Adverse effect of carbonic-anhydrase inhibitors, benzothiadiazides and other diuretics, initial encounter: Secondary | ICD-10-CM | POA: Diagnosis not present

## 2022-11-30 DIAGNOSIS — S0081XA Abrasion of other part of head, initial encounter: Secondary | ICD-10-CM | POA: Diagnosis not present

## 2022-12-06 DIAGNOSIS — S82092A Other fracture of left patella, initial encounter for closed fracture: Secondary | ICD-10-CM | POA: Diagnosis not present

## 2022-12-06 DIAGNOSIS — M25562 Pain in left knee: Secondary | ICD-10-CM | POA: Diagnosis not present

## 2022-12-06 DIAGNOSIS — E7849 Other hyperlipidemia: Secondary | ICD-10-CM | POA: Diagnosis not present

## 2022-12-06 DIAGNOSIS — I10 Essential (primary) hypertension: Secondary | ICD-10-CM | POA: Diagnosis not present

## 2022-12-07 ENCOUNTER — Encounter: Payer: Self-pay | Admitting: *Deleted

## 2022-12-07 ENCOUNTER — Telehealth: Payer: Self-pay | Admitting: *Deleted

## 2022-12-07 NOTE — Transitions of Care (Post Inpatient/ED Visit) (Signed)
   12/07/2022  Name: James Ware MRN: [id number] DOB: 20-May-1961  Today's TOC FU Call Status: Today's TOC FU Call Status:: Unsuccessul Call (1st Attempt) Unsuccessful Call (1st Attempt) Date: 12/07/22  Attempted to reach the patient regarding the most recent Inpatient visit; received automated outgoing voice message stating that the patient's voice mail box is full; unable to leave voice message requesting call back   Follow Up Plan: Additional outreach attempts will be made to reach the patient to complete the Transitions of Care (Post Inpatient visit) call.   Oneta Rack, RN, BSN, CCRN Alumnus RN CM Care Coordination/ Transition of [facility] Management [phone]: direct office

## 2022-12-08 ENCOUNTER — Telehealth: Payer: Self-pay | Admitting: *Deleted

## 2022-12-08 ENCOUNTER — Encounter: Payer: Self-pay | Admitting: *Deleted

## 2022-12-08 DIAGNOSIS — F1090 Alcohol use, unspecified, uncomplicated: Secondary | ICD-10-CM | POA: Diagnosis not present

## 2022-12-08 DIAGNOSIS — I10 Essential (primary) hypertension: Secondary | ICD-10-CM | POA: Diagnosis not present

## 2022-12-08 DIAGNOSIS — J302 Other seasonal allergic rhinitis: Secondary | ICD-10-CM | POA: Diagnosis not present

## 2022-12-08 DIAGNOSIS — E7849 Other hyperlipidemia: Secondary | ICD-10-CM | POA: Diagnosis not present

## 2022-12-08 DIAGNOSIS — S82042D Displaced comminuted fracture of left patella, subsequent encounter for closed fracture with routine healing: Secondary | ICD-10-CM | POA: Diagnosis not present

## 2022-12-08 DIAGNOSIS — Z79891 Long term (current) use of opiate analgesic: Secondary | ICD-10-CM | POA: Diagnosis not present

## 2022-12-08 DIAGNOSIS — Z7982 Long term (current) use of aspirin: Secondary | ICD-10-CM | POA: Diagnosis not present

## 2022-12-08 DIAGNOSIS — R269 Unspecified abnormalities of gait and mobility: Secondary | ICD-10-CM | POA: Diagnosis not present

## 2022-12-08 DIAGNOSIS — H6993 Unspecified Eustachian tube disorder, bilateral: Secondary | ICD-10-CM | POA: Diagnosis not present

## 2022-12-08 NOTE — Transitions of Care (Post Inpatient/ED Visit) (Signed)
   12/08/2022  Name: James Ware MRN: [id number] DOB: 03-13-1961  Today's TOC FU Call Status: Today's TOC FU Call Status:: Unsuccessful Call (2nd Attempt) Unsuccessful Call (2nd Attempt) Date: 12/08/22  Attempted to reach the patient regarding the most recent Inpatient visit; left HIPAA compliant voice message requesting call back  Follow Up Plan: Additional outreach attempts will be made to reach the patient to complete the Transitions of Care (Post Inpatient visit) call.   Oneta Rack, RN, BSN, CCRN Alumnus RN CM Care Coordination/ Transition of [place] Management [phone]: direct office

## 2022-12-09 ENCOUNTER — Encounter: Payer: Self-pay | Admitting: *Deleted

## 2022-12-09 ENCOUNTER — Telehealth: Payer: Self-pay | Admitting: *Deleted

## 2022-12-09 DIAGNOSIS — I10 Essential (primary) hypertension: Secondary | ICD-10-CM | POA: Diagnosis not present

## 2022-12-09 DIAGNOSIS — Z7982 Long term (current) use of aspirin: Secondary | ICD-10-CM | POA: Diagnosis not present

## 2022-12-09 DIAGNOSIS — J302 Other seasonal allergic rhinitis: Secondary | ICD-10-CM | POA: Diagnosis not present

## 2022-12-09 DIAGNOSIS — Z79891 Long term (current) use of opiate analgesic: Secondary | ICD-10-CM | POA: Diagnosis not present

## 2022-12-09 DIAGNOSIS — S82042D Displaced comminuted fracture of left patella, subsequent encounter for closed fracture with routine healing: Secondary | ICD-10-CM | POA: Diagnosis not present

## 2022-12-09 DIAGNOSIS — E7849 Other hyperlipidemia: Secondary | ICD-10-CM | POA: Diagnosis not present

## 2022-12-09 DIAGNOSIS — H6993 Unspecified Eustachian tube disorder, bilateral: Secondary | ICD-10-CM | POA: Diagnosis not present

## 2022-12-09 DIAGNOSIS — F1090 Alcohol use, unspecified, uncomplicated: Secondary | ICD-10-CM | POA: Diagnosis not present

## 2022-12-09 NOTE — Transitions of Care (Post Inpatient/ED Visit) (Signed)
12/09/2022  Name: James Ware MRN: [id number] DOB: October 23, 1960  Today's TOC FU Call Status: Today's TOC FU Call Status:: Successful TOC FU Call Competed TOC FU Call Complete Date: 12/09/22  Transition Care Management Follow-up Telephone Call Date of Discharge: 12/06/22 Discharge Facility: Other ([facility]) Name of Other (Non[facility]) Discharge Facility: [place] Type of Discharge: Inpatient Admission Primary Inpatient Discharge Diagnosis:: (L) patella fracture secondary to mechanical fall How have you been since you were released from the hospital?: Better ("I am doing okay overall-- PT started at home yesterday; I am getting around the home okay and able to most everything for myself with help from my family") Any questions or concerns?: Yes Patient Questions/Concerns:: 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 caregiver for his elderly mother-- his sister is currently assisting caring for his mother Patient Questions/Concerns Addressed: Other: (Declines need for ongoing/ further care coordination outreach/ follow up-- provided my direct contact information should questions/ concerns/ care coordination needs arise post-TOC call)  Items Reviewed: Did you receive and understand the discharge instructions provided?: Yes Medications obtained and verified?: Yes (Medications Reviewed) (brief medication review completed from outside hospital instructions; confirmed patient obtained/ is taking all newly Rx'd medications as instructed; self-manages medications and denies questions/ concerns around medications today) Any new allergies since your discharge?: No Dietary orders reviewed?: Yes Type of Diet Ordered:: low sugar/ carbohydrate Do you have support at home?: Yes People in Home: parent(s), sibling(s) Name of Support/Comfort Primary Source: Sister is assisting as indicated; patient reports he is essentially independent in self-care activities  Home Care and Equipment/Supplies: [place] Ordered?: Yes Name of Bee:: [facility]" "it is an [place] agency" Has Agency set up a time to come to your home?: Yes [place] Visit Date: 12/08/22 Any new equipment or medical supplies ordered?: No  Functional Questionnaire: Do you need assistance with bathing/showering or dressing?: No Do you need assistance with meal preparation?: No Do you need assistance with eating?: No Do you have difficulty maintaining continence: No Do you need assistance with getting out of bed/getting out of a chair/moving?: No Do you have difficulty managing or taking your medications?: No  Folllow up appointments reviewed: PCP Follow-up appointment confirmed?: NA (verified not indicated per outside hospital discharge provider notes- specialist provider recommended only for hospital follow up) [facility] Follow-up appointment confirmed?: Yes Date of Specialist follow-up appointment?: 12/17/22 Follow-Up Specialty Provider:: orthopedic surgeon at [facility] Do you need transportation to your follow-up appointment?: No Do you understand care options if your condition(s) worsen?: Yes-patient verbalized understanding  SDOH Interventions Today    Flowsheet Row Most Recent Value  SDOH Interventions   Food Insecurity Interventions Intervention Not Indicated  Transportation Interventions Intervention Not Indicated  [normally drives self,  family assisting post-recent surgery]      TOC Interventions Today    Flowsheet Row Most Recent Value  TOC Interventions   TOC Interventions Discussed/Reviewed TOC Interventions Discussed  [Declines need for ongoing/ further care coordination outreach/ follow up-- provided my direct contact information should questions/ concerns/ care coordination needs arise post-TOC call]      Interventions Today    Flowsheet Row Most Recent Value  Chronic Disease   Chronic disease during today's visit Other  [(L) knee fracture,  requiring surgical intervention]  General Interventions   General Interventions Discussed/Reviewed General Interventions Discussed, Doctor Visits  Doctor Visits Discussed/Reviewed Specialist, Doctor Visits Discussed, PCP  PCP/Specialist Visits Compliance with follow-up visit  Nutrition Interventions   Nutrition  Discussed/Reviewed Nutrition Discussed  Pharmacy Interventions   Pharmacy Dicussed/Reviewed Pharmacy Topics Discussed      Oneta Rack, RN, BSN, CCRN Alumnus RN CM Care Coordination/ Transition of [place] Management [phone]: direct office

## 2022-12-11 DIAGNOSIS — I10 Essential (primary) hypertension: Secondary | ICD-10-CM | POA: Diagnosis not present

## 2022-12-11 DIAGNOSIS — J302 Other seasonal allergic rhinitis: Secondary | ICD-10-CM | POA: Diagnosis not present

## 2022-12-11 DIAGNOSIS — Z79891 Long term (current) use of opiate analgesic: Secondary | ICD-10-CM | POA: Diagnosis not present

## 2022-12-11 DIAGNOSIS — Z7982 Long term (current) use of aspirin: Secondary | ICD-10-CM | POA: Diagnosis not present

## 2022-12-11 DIAGNOSIS — E7849 Other hyperlipidemia: Secondary | ICD-10-CM | POA: Diagnosis not present

## 2022-12-11 DIAGNOSIS — F1090 Alcohol use, unspecified, uncomplicated: Secondary | ICD-10-CM | POA: Diagnosis not present

## 2022-12-11 DIAGNOSIS — H6993 Unspecified Eustachian tube disorder, bilateral: Secondary | ICD-10-CM | POA: Diagnosis not present

## 2022-12-11 DIAGNOSIS — S82042D Displaced comminuted fracture of left patella, subsequent encounter for closed fracture with routine healing: Secondary | ICD-10-CM | POA: Diagnosis not present

## 2022-12-14 DIAGNOSIS — F1090 Alcohol use, unspecified, uncomplicated: Secondary | ICD-10-CM | POA: Diagnosis not present

## 2022-12-14 DIAGNOSIS — E7849 Other hyperlipidemia: Secondary | ICD-10-CM | POA: Diagnosis not present

## 2022-12-14 DIAGNOSIS — Z79891 Long term (current) use of opiate analgesic: Secondary | ICD-10-CM | POA: Diagnosis not present

## 2022-12-14 DIAGNOSIS — S82042D Displaced comminuted fracture of left patella, subsequent encounter for closed fracture with routine healing: Secondary | ICD-10-CM | POA: Diagnosis not present

## 2022-12-14 DIAGNOSIS — Z7982 Long term (current) use of aspirin: Secondary | ICD-10-CM | POA: Diagnosis not present

## 2022-12-14 DIAGNOSIS — I10 Essential (primary) hypertension: Secondary | ICD-10-CM | POA: Diagnosis not present

## 2022-12-14 DIAGNOSIS — J302 Other seasonal allergic rhinitis: Secondary | ICD-10-CM | POA: Diagnosis not present

## 2022-12-14 DIAGNOSIS — H6993 Unspecified Eustachian tube disorder, bilateral: Secondary | ICD-10-CM | POA: Diagnosis not present

## 2022-12-16 DIAGNOSIS — E7849 Other hyperlipidemia: Secondary | ICD-10-CM | POA: Diagnosis not present

## 2022-12-16 DIAGNOSIS — I10 Essential (primary) hypertension: Secondary | ICD-10-CM | POA: Diagnosis not present

## 2022-12-16 DIAGNOSIS — J302 Other seasonal allergic rhinitis: Secondary | ICD-10-CM | POA: Diagnosis not present

## 2022-12-16 DIAGNOSIS — Z7982 Long term (current) use of aspirin: Secondary | ICD-10-CM | POA: Diagnosis not present

## 2022-12-16 DIAGNOSIS — H6993 Unspecified Eustachian tube disorder, bilateral: Secondary | ICD-10-CM | POA: Diagnosis not present

## 2022-12-16 DIAGNOSIS — Z79891 Long term (current) use of opiate analgesic: Secondary | ICD-10-CM | POA: Diagnosis not present

## 2022-12-16 DIAGNOSIS — S82042D Displaced comminuted fracture of left patella, subsequent encounter for closed fracture with routine healing: Secondary | ICD-10-CM | POA: Diagnosis not present

## 2022-12-16 DIAGNOSIS — F1090 Alcohol use, unspecified, uncomplicated: Secondary | ICD-10-CM | POA: Diagnosis not present

## 2022-12-17 ENCOUNTER — Ambulatory Visit: Payer: 59 | Admitting: Family Medicine

## 2022-12-17 DIAGNOSIS — S82042A Displaced comminuted fracture of left patella, initial encounter for closed fracture: Secondary | ICD-10-CM | POA: Diagnosis not present

## 2022-12-18 DIAGNOSIS — E7849 Other hyperlipidemia: Secondary | ICD-10-CM | POA: Diagnosis not present

## 2022-12-18 DIAGNOSIS — H6993 Unspecified Eustachian tube disorder, bilateral: Secondary | ICD-10-CM | POA: Diagnosis not present

## 2022-12-18 DIAGNOSIS — F1090 Alcohol use, unspecified, uncomplicated: Secondary | ICD-10-CM | POA: Diagnosis not present

## 2022-12-18 DIAGNOSIS — I10 Essential (primary) hypertension: Secondary | ICD-10-CM | POA: Diagnosis not present

## 2022-12-18 DIAGNOSIS — J302 Other seasonal allergic rhinitis: Secondary | ICD-10-CM | POA: Diagnosis not present

## 2022-12-18 DIAGNOSIS — Z7982 Long term (current) use of aspirin: Secondary | ICD-10-CM | POA: Diagnosis not present

## 2022-12-18 DIAGNOSIS — Z79891 Long term (current) use of opiate analgesic: Secondary | ICD-10-CM | POA: Diagnosis not present

## 2022-12-18 DIAGNOSIS — S82042D Displaced comminuted fracture of left patella, subsequent encounter for closed fracture with routine healing: Secondary | ICD-10-CM | POA: Diagnosis not present

## 2022-12-21 DIAGNOSIS — Z7982 Long term (current) use of aspirin: Secondary | ICD-10-CM | POA: Diagnosis not present

## 2022-12-21 DIAGNOSIS — E785 Hyperlipidemia, unspecified: Secondary | ICD-10-CM | POA: Diagnosis not present

## 2022-12-21 DIAGNOSIS — S82042A Displaced comminuted fracture of left patella, initial encounter for closed fracture: Secondary | ICD-10-CM | POA: Diagnosis not present

## 2022-12-21 DIAGNOSIS — I10 Essential (primary) hypertension: Secondary | ICD-10-CM | POA: Diagnosis not present

## 2022-12-21 DIAGNOSIS — Z79899 Other long term (current) drug therapy: Secondary | ICD-10-CM | POA: Diagnosis not present

## 2022-12-21 DIAGNOSIS — E1165 Type 2 diabetes mellitus with hyperglycemia: Secondary | ICD-10-CM | POA: Diagnosis not present

## 2022-12-22 DIAGNOSIS — E7849 Other hyperlipidemia: Secondary | ICD-10-CM | POA: Diagnosis not present

## 2022-12-22 DIAGNOSIS — Z79891 Long term (current) use of opiate analgesic: Secondary | ICD-10-CM | POA: Diagnosis not present

## 2022-12-22 DIAGNOSIS — J302 Other seasonal allergic rhinitis: Secondary | ICD-10-CM | POA: Diagnosis not present

## 2022-12-22 DIAGNOSIS — H6993 Unspecified Eustachian tube disorder, bilateral: Secondary | ICD-10-CM | POA: Diagnosis not present

## 2022-12-22 DIAGNOSIS — S82042D Displaced comminuted fracture of left patella, subsequent encounter for closed fracture with routine healing: Secondary | ICD-10-CM | POA: Diagnosis not present

## 2022-12-22 DIAGNOSIS — I10 Essential (primary) hypertension: Secondary | ICD-10-CM | POA: Diagnosis not present

## 2022-12-22 DIAGNOSIS — F1090 Alcohol use, unspecified, uncomplicated: Secondary | ICD-10-CM | POA: Diagnosis not present

## 2022-12-22 DIAGNOSIS — Z7982 Long term (current) use of aspirin: Secondary | ICD-10-CM | POA: Diagnosis not present

## 2022-12-24 DIAGNOSIS — H6993 Unspecified Eustachian tube disorder, bilateral: Secondary | ICD-10-CM | POA: Diagnosis not present

## 2022-12-24 DIAGNOSIS — Z7982 Long term (current) use of aspirin: Secondary | ICD-10-CM | POA: Diagnosis not present

## 2022-12-24 DIAGNOSIS — S82042D Displaced comminuted fracture of left patella, subsequent encounter for closed fracture with routine healing: Secondary | ICD-10-CM | POA: Diagnosis not present

## 2022-12-24 DIAGNOSIS — Z79891 Long term (current) use of opiate analgesic: Secondary | ICD-10-CM | POA: Diagnosis not present

## 2022-12-24 DIAGNOSIS — J302 Other seasonal allergic rhinitis: Secondary | ICD-10-CM | POA: Diagnosis not present

## 2022-12-24 DIAGNOSIS — F1090 Alcohol use, unspecified, uncomplicated: Secondary | ICD-10-CM | POA: Diagnosis not present

## 2022-12-24 DIAGNOSIS — E7849 Other hyperlipidemia: Secondary | ICD-10-CM | POA: Diagnosis not present

## 2022-12-24 DIAGNOSIS — I10 Essential (primary) hypertension: Secondary | ICD-10-CM | POA: Diagnosis not present

## 2022-12-25 DIAGNOSIS — F1090 Alcohol use, unspecified, uncomplicated: Secondary | ICD-10-CM | POA: Diagnosis not present

## 2022-12-25 DIAGNOSIS — I10 Essential (primary) hypertension: Secondary | ICD-10-CM | POA: Diagnosis not present

## 2022-12-25 DIAGNOSIS — E7849 Other hyperlipidemia: Secondary | ICD-10-CM | POA: Diagnosis not present

## 2022-12-25 DIAGNOSIS — Z7982 Long term (current) use of aspirin: Secondary | ICD-10-CM | POA: Diagnosis not present

## 2022-12-25 DIAGNOSIS — H6993 Unspecified Eustachian tube disorder, bilateral: Secondary | ICD-10-CM | POA: Diagnosis not present

## 2022-12-25 DIAGNOSIS — J302 Other seasonal allergic rhinitis: Secondary | ICD-10-CM | POA: Diagnosis not present

## 2022-12-25 DIAGNOSIS — Z79891 Long term (current) use of opiate analgesic: Secondary | ICD-10-CM | POA: Diagnosis not present

## 2022-12-25 DIAGNOSIS — S82042D Displaced comminuted fracture of left patella, subsequent encounter for closed fracture with routine healing: Secondary | ICD-10-CM | POA: Diagnosis not present

## 2022-12-28 DIAGNOSIS — E7849 Other hyperlipidemia: Secondary | ICD-10-CM | POA: Diagnosis not present

## 2022-12-28 DIAGNOSIS — H6993 Unspecified Eustachian tube disorder, bilateral: Secondary | ICD-10-CM | POA: Diagnosis not present

## 2022-12-28 DIAGNOSIS — Z79891 Long term (current) use of opiate analgesic: Secondary | ICD-10-CM | POA: Diagnosis not present

## 2022-12-28 DIAGNOSIS — J302 Other seasonal allergic rhinitis: Secondary | ICD-10-CM | POA: Diagnosis not present

## 2022-12-28 DIAGNOSIS — I10 Essential (primary) hypertension: Secondary | ICD-10-CM | POA: Diagnosis not present

## 2022-12-28 DIAGNOSIS — F1090 Alcohol use, unspecified, uncomplicated: Secondary | ICD-10-CM | POA: Diagnosis not present

## 2022-12-28 DIAGNOSIS — Z7982 Long term (current) use of aspirin: Secondary | ICD-10-CM | POA: Diagnosis not present

## 2022-12-28 DIAGNOSIS — S82042D Displaced comminuted fracture of left patella, subsequent encounter for closed fracture with routine healing: Secondary | ICD-10-CM | POA: Diagnosis not present

## 2022-12-29 ENCOUNTER — Other Ambulatory Visit: Payer: Self-pay | Admitting: Family Medicine

## 2022-12-29 DIAGNOSIS — J302 Other seasonal allergic rhinitis: Secondary | ICD-10-CM | POA: Diagnosis not present

## 2022-12-29 DIAGNOSIS — F1090 Alcohol use, unspecified, uncomplicated: Secondary | ICD-10-CM | POA: Diagnosis not present

## 2022-12-29 DIAGNOSIS — I10 Essential (primary) hypertension: Secondary | ICD-10-CM | POA: Diagnosis not present

## 2022-12-29 DIAGNOSIS — Z7982 Long term (current) use of aspirin: Secondary | ICD-10-CM | POA: Diagnosis not present

## 2022-12-29 DIAGNOSIS — S82042D Displaced comminuted fracture of left patella, subsequent encounter for closed fracture with routine healing: Secondary | ICD-10-CM | POA: Diagnosis not present

## 2022-12-29 DIAGNOSIS — H6993 Unspecified Eustachian tube disorder, bilateral: Secondary | ICD-10-CM | POA: Diagnosis not present

## 2022-12-29 DIAGNOSIS — E7849 Other hyperlipidemia: Secondary | ICD-10-CM | POA: Diagnosis not present

## 2022-12-29 DIAGNOSIS — Z79891 Long term (current) use of opiate analgesic: Secondary | ICD-10-CM | POA: Diagnosis not present

## 2022-12-31 DIAGNOSIS — J302 Other seasonal allergic rhinitis: Secondary | ICD-10-CM | POA: Diagnosis not present

## 2022-12-31 DIAGNOSIS — Z7982 Long term (current) use of aspirin: Secondary | ICD-10-CM | POA: Diagnosis not present

## 2022-12-31 DIAGNOSIS — H6993 Unspecified Eustachian tube disorder, bilateral: Secondary | ICD-10-CM | POA: Diagnosis not present

## 2022-12-31 DIAGNOSIS — S82042D Displaced comminuted fracture of left patella, subsequent encounter for closed fracture with routine healing: Secondary | ICD-10-CM | POA: Diagnosis not present

## 2022-12-31 DIAGNOSIS — Z79891 Long term (current) use of opiate analgesic: Secondary | ICD-10-CM | POA: Diagnosis not present

## 2022-12-31 DIAGNOSIS — I10 Essential (primary) hypertension: Secondary | ICD-10-CM | POA: Diagnosis not present

## 2022-12-31 DIAGNOSIS — E7849 Other hyperlipidemia: Secondary | ICD-10-CM | POA: Diagnosis not present

## 2022-12-31 DIAGNOSIS — F1090 Alcohol use, unspecified, uncomplicated: Secondary | ICD-10-CM | POA: Diagnosis not present

## 2023-01-07 DIAGNOSIS — S82042A Displaced comminuted fracture of left patella, initial encounter for closed fracture: Secondary | ICD-10-CM | POA: Diagnosis not present

## 2023-01-07 DIAGNOSIS — T148XXA Other injury of unspecified body region, initial encounter: Secondary | ICD-10-CM | POA: Diagnosis not present

## 2023-01-08 DIAGNOSIS — R269 Unspecified abnormalities of gait and mobility: Secondary | ICD-10-CM | POA: Diagnosis not present

## 2023-01-20 DIAGNOSIS — M25562 Pain in left knee: Secondary | ICD-10-CM | POA: Diagnosis not present

## 2023-01-26 DIAGNOSIS — M25562 Pain in left knee: Secondary | ICD-10-CM | POA: Diagnosis not present

## 2023-01-28 DIAGNOSIS — M25562 Pain in left knee: Secondary | ICD-10-CM | POA: Diagnosis not present

## 2023-02-02 DIAGNOSIS — M25562 Pain in left knee: Secondary | ICD-10-CM | POA: Diagnosis not present

## 2023-02-07 DIAGNOSIS — R269 Unspecified abnormalities of gait and mobility: Secondary | ICD-10-CM | POA: Diagnosis not present

## 2023-02-09 DIAGNOSIS — M25562 Pain in left knee: Secondary | ICD-10-CM | POA: Diagnosis not present

## 2023-02-10 DIAGNOSIS — D235 Other benign neoplasm of skin of trunk: Secondary | ICD-10-CM | POA: Diagnosis not present

## 2023-02-10 DIAGNOSIS — Z6834 Body mass index (BMI) 34.0-34.9, adult: Secondary | ICD-10-CM | POA: Diagnosis not present

## 2023-02-10 DIAGNOSIS — I951 Orthostatic hypotension: Secondary | ICD-10-CM | POA: Diagnosis not present

## 2023-02-10 DIAGNOSIS — I10 Essential (primary) hypertension: Secondary | ICD-10-CM | POA: Diagnosis not present

## 2023-02-10 DIAGNOSIS — J302 Other seasonal allergic rhinitis: Secondary | ICD-10-CM | POA: Diagnosis not present

## 2023-02-10 DIAGNOSIS — R269 Unspecified abnormalities of gait and mobility: Secondary | ICD-10-CM | POA: Diagnosis not present

## 2023-02-10 DIAGNOSIS — Z8249 Family history of ischemic heart disease and other diseases of the circulatory system: Secondary | ICD-10-CM | POA: Diagnosis not present

## 2023-02-10 DIAGNOSIS — E785 Hyperlipidemia, unspecified: Secondary | ICD-10-CM | POA: Diagnosis not present

## 2023-02-10 DIAGNOSIS — D225 Melanocytic nevi of trunk: Secondary | ICD-10-CM | POA: Diagnosis not present

## 2023-02-10 DIAGNOSIS — Z809 Family history of malignant neoplasm, unspecified: Secondary | ICD-10-CM | POA: Diagnosis not present

## 2023-02-10 DIAGNOSIS — D2272 Melanocytic nevi of left lower limb, including hip: Secondary | ICD-10-CM | POA: Diagnosis not present

## 2023-02-10 DIAGNOSIS — D2262 Melanocytic nevi of left upper limb, including shoulder: Secondary | ICD-10-CM | POA: Diagnosis not present

## 2023-02-10 DIAGNOSIS — Z720 Tobacco use: Secondary | ICD-10-CM | POA: Diagnosis not present

## 2023-02-10 DIAGNOSIS — L570 Actinic keratosis: Secondary | ICD-10-CM | POA: Diagnosis not present

## 2023-02-10 DIAGNOSIS — E669 Obesity, unspecified: Secondary | ICD-10-CM | POA: Diagnosis not present

## 2023-02-10 DIAGNOSIS — C4442 Squamous cell carcinoma of skin of scalp and neck: Secondary | ICD-10-CM | POA: Diagnosis not present

## 2023-02-10 DIAGNOSIS — R609 Edema, unspecified: Secondary | ICD-10-CM | POA: Diagnosis not present

## 2023-02-10 DIAGNOSIS — D492 Neoplasm of unspecified behavior of bone, soft tissue, and skin: Secondary | ICD-10-CM | POA: Diagnosis not present

## 2023-02-10 DIAGNOSIS — E119 Type 2 diabetes mellitus without complications: Secondary | ICD-10-CM | POA: Diagnosis not present

## 2023-02-10 DIAGNOSIS — L821 Other seborrheic keratosis: Secondary | ICD-10-CM | POA: Diagnosis not present

## 2023-02-11 DIAGNOSIS — S82092D Other fracture of left patella, subsequent encounter for closed fracture with routine healing: Secondary | ICD-10-CM | POA: Diagnosis not present

## 2023-02-11 DIAGNOSIS — T148XXA Other injury of unspecified body region, initial encounter: Secondary | ICD-10-CM | POA: Diagnosis not present

## 2023-02-11 DIAGNOSIS — S82042A Displaced comminuted fracture of left patella, initial encounter for closed fracture: Secondary | ICD-10-CM | POA: Diagnosis not present

## 2023-02-12 DIAGNOSIS — M25562 Pain in left knee: Secondary | ICD-10-CM | POA: Diagnosis not present

## 2023-02-16 DIAGNOSIS — M25562 Pain in left knee: Secondary | ICD-10-CM | POA: Diagnosis not present

## 2023-02-23 DIAGNOSIS — M25562 Pain in left knee: Secondary | ICD-10-CM | POA: Diagnosis not present

## 2023-02-25 DIAGNOSIS — M25562 Pain in left knee: Secondary | ICD-10-CM | POA: Diagnosis not present

## 2023-03-02 DIAGNOSIS — M25562 Pain in left knee: Secondary | ICD-10-CM | POA: Diagnosis not present

## 2023-03-09 DIAGNOSIS — M25562 Pain in left knee: Secondary | ICD-10-CM | POA: Diagnosis not present

## 2023-03-10 DIAGNOSIS — R269 Unspecified abnormalities of gait and mobility: Secondary | ICD-10-CM | POA: Diagnosis not present

## 2023-03-11 DIAGNOSIS — M25562 Pain in left knee: Secondary | ICD-10-CM | POA: Diagnosis not present

## 2023-03-16 DIAGNOSIS — M25562 Pain in left knee: Secondary | ICD-10-CM | POA: Diagnosis not present

## 2023-03-19 DIAGNOSIS — M25562 Pain in left knee: Secondary | ICD-10-CM | POA: Diagnosis not present

## 2023-03-23 DIAGNOSIS — M25562 Pain in left knee: Secondary | ICD-10-CM | POA: Diagnosis not present

## 2023-03-24 DIAGNOSIS — H906 Mixed conductive and sensorineural hearing loss, bilateral: Secondary | ICD-10-CM | POA: Diagnosis not present

## 2023-03-25 DIAGNOSIS — M25562 Pain in left knee: Secondary | ICD-10-CM | POA: Diagnosis not present

## 2023-03-29 ENCOUNTER — Other Ambulatory Visit: Payer: Self-pay | Admitting: Family Medicine

## 2023-03-29 DIAGNOSIS — I10 Essential (primary) hypertension: Secondary | ICD-10-CM

## 2023-03-30 DIAGNOSIS — M25562 Pain in left knee: Secondary | ICD-10-CM | POA: Diagnosis not present

## 2023-03-30 NOTE — Telephone Encounter (Signed)
Patient scheduled for 04/21/23, thanks.

## 2023-03-30 NOTE — Telephone Encounter (Signed)
Pls contact the patient to schedule HTN follow-up with Dr. Ashley Royalty. Past due. Sending 30 tabs until appt. Thanks

## 2023-04-06 DIAGNOSIS — C4442 Squamous cell carcinoma of skin of scalp and neck: Secondary | ICD-10-CM | POA: Diagnosis not present

## 2023-04-08 DIAGNOSIS — M25562 Pain in left knee: Secondary | ICD-10-CM | POA: Diagnosis not present

## 2023-04-09 DIAGNOSIS — R269 Unspecified abnormalities of gait and mobility: Secondary | ICD-10-CM | POA: Diagnosis not present

## 2023-04-13 DIAGNOSIS — M25562 Pain in left knee: Secondary | ICD-10-CM | POA: Diagnosis not present

## 2023-04-15 DIAGNOSIS — M25562 Pain in left knee: Secondary | ICD-10-CM | POA: Diagnosis not present

## 2023-04-19 DIAGNOSIS — H35373 Puckering of macula, bilateral: Secondary | ICD-10-CM | POA: Diagnosis not present

## 2023-04-19 DIAGNOSIS — E119 Type 2 diabetes mellitus without complications: Secondary | ICD-10-CM | POA: Diagnosis not present

## 2023-04-19 DIAGNOSIS — H04123 Dry eye syndrome of bilateral lacrimal glands: Secondary | ICD-10-CM | POA: Diagnosis not present

## 2023-04-19 DIAGNOSIS — M25562 Pain in left knee: Secondary | ICD-10-CM | POA: Diagnosis not present

## 2023-04-19 DIAGNOSIS — H52223 Regular astigmatism, bilateral: Secondary | ICD-10-CM | POA: Diagnosis not present

## 2023-04-19 DIAGNOSIS — H40003 Preglaucoma, unspecified, bilateral: Secondary | ICD-10-CM | POA: Diagnosis not present

## 2023-04-19 DIAGNOSIS — H5203 Hypermetropia, bilateral: Secondary | ICD-10-CM | POA: Diagnosis not present

## 2023-04-19 DIAGNOSIS — H524 Presbyopia: Secondary | ICD-10-CM | POA: Diagnosis not present

## 2023-04-19 DIAGNOSIS — H2513 Age-related nuclear cataract, bilateral: Secondary | ICD-10-CM | POA: Diagnosis not present

## 2023-04-21 ENCOUNTER — Ambulatory Visit (INDEPENDENT_AMBULATORY_CARE_PROVIDER_SITE_OTHER): Payer: 59 | Admitting: Family Medicine

## 2023-04-21 ENCOUNTER — Encounter: Payer: Self-pay | Admitting: Family Medicine

## 2023-04-21 VITALS — BP 121/80 | HR 74 | Ht 75.0 in | Wt 268.0 lb

## 2023-04-21 DIAGNOSIS — I10 Essential (primary) hypertension: Secondary | ICD-10-CM

## 2023-04-21 DIAGNOSIS — E785 Hyperlipidemia, unspecified: Secondary | ICD-10-CM

## 2023-04-21 DIAGNOSIS — R7303 Prediabetes: Secondary | ICD-10-CM

## 2023-04-21 DIAGNOSIS — E1169 Type 2 diabetes mellitus with other specified complication: Secondary | ICD-10-CM | POA: Diagnosis not present

## 2023-04-21 DIAGNOSIS — Z125 Encounter for screening for malignant neoplasm of prostate: Secondary | ICD-10-CM | POA: Diagnosis not present

## 2023-04-21 LAB — POCT GLYCOSYLATED HEMOGLOBIN (HGB A1C): HbA1c, POC (controlled diabetic range): 7.1 % — AB (ref 0.0–7.0)

## 2023-04-21 NOTE — Progress Notes (Signed)
James Ware - 62 y.o. male MRN [id number]  Date of birth: 11-Oct-1960  Subjective Chief Complaint  Patient presents with   Hypertension    HPI James Ware is a 62 y.o. male here today for follow up visit.   He reports that he has had a stressful year.  His mother has been ill and is currently on Hospice and he has had recent patellar fracture.    Continues on hydrochlorothiazide for management of HTN.  BP remains well controlled.  Denies side effects related to medication.  He has not had chest pain, shortness of breath, palpitations, headache or vision changes.   History of prediabetes. A1c in March was 6.1%. Increased to 7.1% today.  He has not been as active recently due to patellar fracture.  He does plan to work on dietary changes.   Tolerating crestor well for management of lipids.   ROS:  A comprehensive ROS was completed and negative except as noted per HPI  No Known Allergies  Past Medical History:  Diagnosis Date   Arthritis    left knee   Eczema 05/24/2017   Essential hypertension 05/24/2017   Hyperlipidemia    Prediabetes 05/24/2017   Venous stasis dermatitis of both lower extremities 05/24/2017    Past Surgical History:  Procedure Laterality Date   COLONOSCOPY     TONSILLECTOMY     in first grade   TYMPANOSTOMY TUBE PLACEMENT     as a child   WISDOM TOOTH EXTRACTION      Social History   Socioeconomic History   Marital status: Divorced    Spouse name: Not on file   Number of children: Not on file   Years of education: Not on file   Highest education level: Professional school degree (e.g., MD, DDS, DVM, JD)  Occupational History   Not on file  Tobacco Use   Smoking status: Former   Smokeless tobacco: Never  Vaping Use   Vaping status: Never Used  Substance and Sexual Activity   Alcohol use: Yes    Comment: 1-2 drinks per week   Drug use: Never   Sexual activity: Not on file  Other Topics Concern   Not on file  Social History Narrative    Not on file   Social Determinants of Health   Financial Resource Strain: Low Risk  (04/17/2023)   Overall Financial Resource Strain (CARDIA)    Difficulty of Paying Living Expenses: Not hard at all  Food Insecurity: No Food Insecurity (04/17/2023)   Hunger Vital Sign    Worried About Running Out of Food in the Last Year: Never true    Ran Out of Food in the Last Year: Never true  Transportation Needs: No Transportation Needs (04/17/2023)   PRAPARE - Administrator, Civil Service (Medical): No    Lack of Transportation (Non-Medical): No  Physical Activity: Sufficiently Active (04/17/2023)   Exercise Vital Sign    Days of Exercise per Week: 4 days    Minutes of Exercise per Session: 40 min  Stress: No Stress Concern Present (04/17/2023)   [employer] of Occupational Health - Occupational Stress Questionnaire    Feeling of Stress : Not at all  Social Connections: Moderately Integrated (04/17/2023)   Social Connection and Isolation Panel [NHANES]    Frequency of Communication with Friends and Family: More than three times a week    Frequency of Social Gatherings with Friends and Family: More than three times a week    Attends Religious  Services: More than 4 times per year    Active Member of Clubs or Organizations: Yes    Attends Banker Meetings: More than 4 times per year    Marital Status: Divorced    Family History  Problem Relation Age of Onset   Colon cancer Mother 57   Hyperlipidemia Mother    Cancer Father        bladder   Esophageal cancer Neg Hx    Rectal cancer Neg Hx    Stomach cancer Neg Hx     Health Maintenance  Topic Date Due   COVID-19 Vaccine (6 - 2023-24 season) 05/07/2023 (Originally 12/04/2022)   HIV Screening  04/20/2024 (Originally 04/26/1974)   INFLUENZA VACCINE  04/29/2023   Diabetic kidney evaluation - eGFR measurement  09/12/2023   Diabetic kidney evaluation - Urine ACR  09/12/2023   FOOT EXAM  09/18/2023   OPHTHALMOLOGY  EXAM  10/10/2023   HEMOGLOBIN A1C  10/22/2023   Colonoscopy  09/12/2027   DTaP/Tdap/Td (3 - Td or Tdap) 10/31/2030   Hepatitis C Screening  Completed   Zoster Vaccines- Shingrix  Completed   HPV VACCINES  Aged Out     ----------------------------------------------------------------------------------------------------------------------------------------------------------------------------------------------------------------- Physical Exam BP 121/80 (BP Location: Right Arm, Patient Position: Sitting, Cuff Size: Large)   Pulse 74   Ht 6\' 3"  (1.905 m)   Wt 268 lb (121.6 kg)   SpO2 99%   BMI 33.50 kg/m   Physical Exam Constitutional:      Appearance: Normal appearance.  HENT:     Head: Normocephalic and atraumatic.  Eyes:     General: No scleral icterus. Cardiovascular:     Rate and Rhythm: Normal rate and regular rhythm.  Pulmonary:     Effort: Pulmonary effort is normal.     Breath sounds: Normal breath sounds.  Neurological:     Mental Status: He is alert.  Psychiatric:        Mood and Affect: Mood normal.        Behavior: Behavior normal.     ------------------------------------------------------------------------------------------------------------------------------------------------------------------------------------------------------------------- Assessment and Plan  Essential hypertension Blood pressure is well-controlled.  Recommend he continue current medications for management of hypertension.  Prediabetes A1c has increased into the diabetes range.  Encouraged dietary changes as well is incorporation of increased activity as tolerated.  HLD (hyperlipidemia) Continue rosuvastatin at current strength.   No orders of the defined types were placed in this encounter.   Return in about 3 months (around 07/22/2023) for F/u T2DM.    This visit occurred during the SARS-CoV-2 public health emergency.  Safety protocols were in place, including screening  questions prior to the visit, additional usage of staff PPE, and extensive cleaning of exam room while observing appropriate contact time as indicated for disinfecting solutions.

## 2023-04-21 NOTE — Assessment & Plan Note (Signed)
Blood pressure is well-controlled.  Recommend he continue current medications for management of hypertension.

## 2023-04-21 NOTE — Assessment & Plan Note (Signed)
A1c has increased into the diabetes range.  Encouraged dietary changes as well is incorporation of increased activity as tolerated.

## 2023-04-21 NOTE — Assessment & Plan Note (Signed)
Continue rosuvastatin at current strength.

## 2023-04-26 ENCOUNTER — Other Ambulatory Visit: Payer: Self-pay | Admitting: Family Medicine

## 2023-04-26 ENCOUNTER — Telehealth: Payer: Self-pay | Admitting: Family Medicine

## 2023-04-26 DIAGNOSIS — S82002D Unspecified fracture of left patella, subsequent encounter for closed fracture with routine healing: Secondary | ICD-10-CM

## 2023-04-26 DIAGNOSIS — I10 Essential (primary) hypertension: Secondary | ICD-10-CM

## 2023-04-26 NOTE — Telephone Encounter (Signed)
Patient called in requesting that a new referral be put in to extend his PT [facility] [place], [place]. His insurance states that he needs a new referral to continue. Therapist has stated that it was medically necessary to extent rehab.

## 2023-04-28 ENCOUNTER — Telehealth: Payer: Self-pay | Admitting: Family Medicine

## 2023-04-28 NOTE — Telephone Encounter (Signed)
Orders entered

## 2023-04-28 NOTE — Telephone Encounter (Signed)
Patient is following up on a new referral for PT. Please advise.

## 2023-05-03 ENCOUNTER — Telehealth: Payer: Self-pay | Admitting: Family Medicine

## 2023-05-03 NOTE — Telephone Encounter (Signed)
Patient called in stating that [employer] is needed the paperwork sent to PT also be sent to them. States that Monia Pouch is needing a call to [phone] to receive proper paperwork. Please advise.

## 2023-05-06 NOTE — Telephone Encounter (Signed)
05/06/2023- left message on patients voicemail to contact office to give correct Physical Therapy office information. Contacted [facility] and was told that patient has never been seen at the office.

## 2023-05-07 ENCOUNTER — Encounter: Payer: Self-pay | Admitting: Family Medicine

## 2023-05-07 NOTE — Telephone Encounter (Signed)
Contacted [employer] concerning PT extension @ [phone] option 2.  Requesting: Letter of request Medical records that state medical necessity for PT  Fax letter to : [phone]

## 2023-05-07 NOTE — Telephone Encounter (Signed)
Letter printed.  Please let patient know that he'll need to have PT send records and will probably need records from orthopedics sent.

## 2023-05-10 DIAGNOSIS — R269 Unspecified abnormalities of gait and mobility: Secondary | ICD-10-CM | POA: Diagnosis not present

## 2023-05-13 ENCOUNTER — Telehealth: Payer: Self-pay

## 2023-05-20 DIAGNOSIS — S82092D Other fracture of left patella, subsequent encounter for closed fracture with routine healing: Secondary | ICD-10-CM | POA: Diagnosis not present

## 2023-05-20 DIAGNOSIS — T148XXA Other injury of unspecified body region, initial encounter: Secondary | ICD-10-CM | POA: Diagnosis not present

## 2023-05-20 DIAGNOSIS — S82002D Unspecified fracture of left patella, subsequent encounter for closed fracture with routine healing: Secondary | ICD-10-CM | POA: Diagnosis not present

## 2023-05-25 ENCOUNTER — Ambulatory Visit (INDEPENDENT_AMBULATORY_CARE_PROVIDER_SITE_OTHER): Payer: 59 | Admitting: Family Medicine

## 2023-05-25 ENCOUNTER — Encounter: Payer: Self-pay | Admitting: Family Medicine

## 2023-05-25 ENCOUNTER — Telehealth: Payer: Self-pay | Admitting: Family Medicine

## 2023-05-25 VITALS — BP 129/80 | HR 89 | Ht 75.0 in | Wt 271.0 lb

## 2023-05-25 DIAGNOSIS — R868 Other abnormal findings in specimens from male genital organs: Secondary | ICD-10-CM | POA: Insufficient documentation

## 2023-05-25 DIAGNOSIS — I10 Essential (primary) hypertension: Secondary | ICD-10-CM | POA: Diagnosis not present

## 2023-05-25 DIAGNOSIS — R39198 Other difficulties with micturition: Secondary | ICD-10-CM

## 2023-05-25 DIAGNOSIS — R8290 Unspecified abnormal findings in urine: Secondary | ICD-10-CM

## 2023-05-25 DIAGNOSIS — Z23 Encounter for immunization: Secondary | ICD-10-CM

## 2023-05-25 LAB — POCT URINALYSIS DIP (CLINITEK)
Bilirubin, UA: NEGATIVE
Blood, UA: NEGATIVE
Glucose, UA: NEGATIVE mg/dL
Ketones, POC UA: NEGATIVE mg/dL
Nitrite, UA: NEGATIVE
POC PROTEIN,UA: NEGATIVE
Spec Grav, UA: 1.025 (ref 1.010–1.025)
Urobilinogen, UA: 0.2 E.U./dL
pH, UA: 5.5 (ref 5.0–8.0)

## 2023-05-25 MED ORDER — SULFAMETHOXAZOLE-TRIMETHOPRIM 800-160 MG PO TABS
1.0000 | ORAL_TABLET | Freq: Two times a day (BID) | ORAL | 0 refills | Status: DC
Start: 2023-05-25 — End: 2023-05-25

## 2023-05-25 MED ORDER — SULFAMETHOXAZOLE-TRIMETHOPRIM 800-160 MG PO TABS: 1.0000 | ORAL_TABLET | Freq: Two times a day (BID) | ORAL | 0 refills | Status: DC

## 2023-05-25 MED ORDER — HYDROCHLOROTHIAZIDE 25 MG PO TABS
25.0000 mg | ORAL_TABLET | Freq: Every day | ORAL | 1 refills | Status: DC
Start: 2023-05-25 — End: 2024-01-31

## 2023-05-25 MED ORDER — SULFAMETHOXAZOLE-TRIMETHOPRIM 800-160 MG PO TABS
1.0000 | ORAL_TABLET | Freq: Two times a day (BID) | ORAL | 0 refills | Status: DC
Start: 2023-05-25 — End: 2023-06-11

## 2023-05-25 NOTE — Progress Notes (Signed)
James Ware - 62 y.o. male MRN [id number]  Date of birth: [birth year]  Subjective Chief Complaint  Patient presents with   prostate Concern    HPI James Ware is a 62 year old male here today with complaint of possible prostate issue.  He reports he has been under quite a bit of stress recently.  His mother is currently on hospice and has been given days to weeks to live.  He is also executor of her estate and his sisters are already giving him a hard time about how things will be divided.  Additionally the hardware from his recent knee replacement has loosened.  He will need to have this revised at some point.  No significant pain at this time.  He reports that he noticed discolored semen during masturbation recently.  He semen was green yellow-colored with some blood-tinged.  Another time it appeared dark, rust like in appearance.  He has had some mild perineal pain.  He denies difficulty with urination.  Denies pain with urination.  No increased low back pain.   ROS:  A comprehensive ROS was completed and negative except as noted per HPI  No Known Allergies   Past Surgical History:  Procedure Laterality Date   COLONOSCOPY     TONSILLECTOMY     in first grade   TYMPANOSTOMY TUBE PLACEMENT     as a child   WISDOM TOOTH EXTRACTION      Social History   Socioeconomic History   Marital status: Divorced    Spouse name: Not on file   Number of children: Not on file   Years of education: Not on file   Highest education level: Professional school degree (e.g., MD, DDS, DVM, JD)  Occupational History   Not on file  Tobacco Use   Smoking status: Former   Smokeless tobacco: Never  Vaping Use   Vaping status: Never Used  Substance and Sexual Activity   Alcohol use: Yes    Comment: 1-2 drinks per week   Drug use: Never   Sexual activity: Not on file  Other Topics Concern   Not on file  Social History Narrative   Not on file   Social Determinants of Health   Financial  Resource Strain: Low Risk  (04/17/2023)   Overall Financial Resource Strain (CARDIA)    Difficulty of Paying Living Expenses: Not hard at all  Food Insecurity: No Food Insecurity (04/17/2023)   Hunger Vital Sign    Worried About Running Out of Food in the Last Year: Never true    Ran Out of Food in the Last Year: Never true  Transportation Needs: No Transportation Needs (04/17/2023)   PRAPARE - Administrator, Civil Service (Medical): No    Lack of Transportation (Non-Medical): No  Physical Activity: Sufficiently Active (04/17/2023)   Exercise Vital Sign    Days of Exercise per Week: 4 days    Minutes of Exercise per Session: 40 min  Stress: No Stress Concern Present (04/17/2023)   [employer] of Occupational Health - Occupational Stress Questionnaire    Feeling of Stress : Not at all  Social Connections: Moderately Integrated (04/17/2023)   Social Connection and Isolation Panel [NHANES]    Frequency of Communication with Friends and Family: More than three times a week    Frequency of Social Gatherings with Friends and Family: More than three times a week    Attends Religious Services: More than 4 times per year    Active Member of [employer]  or Organizations: Yes    Attends Engineer, structural: More than 4 times per year    Marital Status: Divorced    Family History  Problem Relation Age of Onset   Colon cancer Mother 31   Hyperlipidemia Mother    Cancer Father        bladder   Esophageal cancer Neg Hx    Rectal cancer Neg Hx    Stomach cancer Neg Hx     Health Maintenance  Topic Date Due   COVID-19 Vaccine (6 - 2023-24 season) 08/10/2023 (Originally 12/04/2022)   HIV Screening  04/20/2024 (Originally 04/26/1974)   Diabetic kidney evaluation - eGFR measurement  09/12/2023   Diabetic kidney evaluation - Urine ACR  09/12/2023   FOOT EXAM  09/18/2023   OPHTHALMOLOGY EXAM  10/10/2023   HEMOGLOBIN A1C  10/22/2023   Colonoscopy  09/12/2027   DTaP/Tdap/Td (3 -  Td or Tdap) 10/31/2030   INFLUENZA VACCINE  Completed   Hepatitis C Screening  Completed   Zoster Vaccines- Shingrix  Completed   HPV VACCINES  Aged Out     ----------------------------------------------------------------------------------------------------------------------------------------------------------------------------------------------------------------- Physical Exam BP 129/80 (BP Location: Left Arm, Patient Position: Sitting, Cuff Size: Large)   Pulse 89   Ht 6\' 3"  (1.905 m)   Wt 271 lb (122.9 kg)   SpO2 98%   BMI 33.87 kg/m   Physical Exam Constitutional:      Appearance: Normal appearance.  HENT:     Head: Normocephalic and atraumatic.  Eyes:     General: No scleral icterus. Cardiovascular:     Rate and Rhythm: Normal rate and regular rhythm.  Pulmonary:     Effort: Pulmonary effort is normal.     Breath sounds: Normal breath sounds.  Musculoskeletal:     Cervical back: Neck supple.  Neurological:     Mental Status: He is alert.  Psychiatric:        Mood and Affect: Mood normal.        Behavior: Behavior normal.     ------------------------------------------------------------------------------------------------------------------------------------------------------------------------------------------------------------------- Assessment and Plan  Discolored semen He has had some discolored semen along with perineal pain.  Urinalysis with some leukocytes.  Sending for culture.  Will go ahead and cover empirically for prostatitis.  14-day course of Bactrim sent in.  Checking PSA level today.   Meds ordered this encounter  Medications   hydrochlorothiazide (HYDRODIURIL) 25 MG tablet    Sig: Take 1 tablet (25 mg total) by mouth daily.    Dispense:  90 tablet    Refill:  1   DISCONTD: sulfamethoxazole-trimethoprim (BACTRIM DS) 800-160 MG tablet    Sig: Take 1 tablet by mouth 2 (two) times daily.    Dispense:  28 tablet    Refill:  0   DISCONTD:  sulfamethoxazole-trimethoprim (BACTRIM DS) 800-160 MG tablet    Sig: Take 1 tablet by mouth 2 (two) times daily.    Dispense:  28 tablet    Refill:  0   DISCONTD: sulfamethoxazole-trimethoprim (BACTRIM DS) 800-160 MG tablet    Sig: Take 1 tablet by mouth 2 (two) times daily.    Dispense:  28 tablet    Refill:  0   sulfamethoxazole-trimethoprim (BACTRIM DS) 800-160 MG tablet    Sig: Take 1 tablet by mouth 2 (two) times daily.    Dispense:  28 tablet    Refill:  0    No follow-ups on file.    This visit occurred during the SARS-CoV-2 public health emergency.  Safety protocols were in place,  including screening questions prior to the visit, additional usage of staff PPE, and extensive cleaning of exam room while observing appropriate contact time as indicated for disinfecting solutions.

## 2023-05-25 NOTE — Telephone Encounter (Signed)
Returned patients call back, advised patient that initial Physical Therapy  orders were written by Surgeon and Physical Therapy office normally gets the extension when needed. Patient also advised that we have not received any medical records from Physical Therapy office and can not see information through Epic.   Pt states that [facility] sent a letter requesting information on 05/18/2023. No letter has been received.  No additional information to send [employer] other than clinical notes from 04/21/2023. Monia Pouch is probably needing the Physical Therapy notes.

## 2023-05-25 NOTE — Assessment & Plan Note (Addendum)
He has had some discolored semen along with perineal pain.  Urinalysis with some leukocytes.  Sending for culture.  Will go ahead and cover empirically for prostatitis.  14-day course of Bactrim sent in.  Checking PSA level today.

## 2023-05-26 LAB — PSA: Prostate Specific Ag, Serum: 0.5 ng/mL (ref 0.0–4.0)

## 2023-05-27 LAB — URINE CULTURE

## 2023-06-10 DIAGNOSIS — R269 Unspecified abnormalities of gait and mobility: Secondary | ICD-10-CM | POA: Diagnosis not present

## 2023-06-11 ENCOUNTER — Other Ambulatory Visit: Payer: Self-pay | Admitting: Family Medicine

## 2023-06-11 DIAGNOSIS — R8290 Unspecified abnormal findings in urine: Secondary | ICD-10-CM

## 2023-06-11 DIAGNOSIS — R39198 Other difficulties with micturition: Secondary | ICD-10-CM

## 2023-06-11 NOTE — Telephone Encounter (Signed)
Pt was contacted and situation being follow-up by Referral Coordinator.

## 2023-06-11 NOTE — Telephone Encounter (Signed)
Requesting rx rf of Bactrim DS Last written 05/25/2023 Last OV 05/25/2023 Please see message from patient.  Patient comment: Pains dropped quickly upon use of this. Missed 4 doses due schedule disruptions due to death of my mom June 19, 2023. Still had dark reddish brown in semen Sunday after first stress discharge since start of sulfa, with some slight testicular soreness since. Figure I did not wait long enough to clear system given missing doses. No kidney or lower back pain. Why asking to continue meds one more cycle

## 2023-06-14 ENCOUNTER — Other Ambulatory Visit: Payer: Self-pay | Admitting: Family Medicine

## 2023-06-14 ENCOUNTER — Encounter: Payer: Self-pay | Admitting: Family Medicine

## 2023-06-14 DIAGNOSIS — R8290 Unspecified abnormal findings in urine: Secondary | ICD-10-CM

## 2023-06-14 DIAGNOSIS — R39198 Other difficulties with micturition: Secondary | ICD-10-CM

## 2023-06-14 MED ORDER — SULFAMETHOXAZOLE-TRIMETHOPRIM 800-160 MG PO TABS
1.0000 | ORAL_TABLET | Freq: Two times a day (BID) | ORAL | 0 refills | Status: DC
Start: 2023-06-14 — End: 2023-06-14

## 2023-06-14 MED ORDER — SULFAMETHOXAZOLE-TRIMETHOPRIM 800-160 MG PO TABS
1.0000 | ORAL_TABLET | Freq: Two times a day (BID) | ORAL | 0 refills | Status: DC
Start: 2023-06-14 — End: 2023-08-19

## 2023-06-21 DIAGNOSIS — H35079 Retinal telangiectasis, unspecified eye: Secondary | ICD-10-CM | POA: Insufficient documentation

## 2023-06-28 DIAGNOSIS — H35073 Retinal telangiectasis, bilateral: Secondary | ICD-10-CM | POA: Diagnosis not present

## 2023-06-28 DIAGNOSIS — H52203 Unspecified astigmatism, bilateral: Secondary | ICD-10-CM | POA: Diagnosis not present

## 2023-06-28 DIAGNOSIS — H5202 Hypermetropia, left eye: Secondary | ICD-10-CM | POA: Diagnosis not present

## 2023-06-28 DIAGNOSIS — H2513 Age-related nuclear cataract, bilateral: Secondary | ICD-10-CM | POA: Diagnosis not present

## 2023-06-28 DIAGNOSIS — H35053 Retinal neovascularization, unspecified, bilateral: Secondary | ICD-10-CM | POA: Diagnosis not present

## 2023-06-28 DIAGNOSIS — H524 Presbyopia: Secondary | ICD-10-CM | POA: Diagnosis not present

## 2023-06-28 DIAGNOSIS — H5211 Myopia, right eye: Secondary | ICD-10-CM | POA: Diagnosis not present

## 2023-07-10 DIAGNOSIS — R269 Unspecified abnormalities of gait and mobility: Secondary | ICD-10-CM | POA: Diagnosis not present

## 2023-07-15 DIAGNOSIS — S82042A Displaced comminuted fracture of left patella, initial encounter for closed fracture: Secondary | ICD-10-CM | POA: Diagnosis not present

## 2023-07-15 DIAGNOSIS — T148XXA Other injury of unspecified body region, initial encounter: Secondary | ICD-10-CM | POA: Diagnosis not present

## 2023-07-27 ENCOUNTER — Other Ambulatory Visit: Payer: Self-pay | Admitting: Family Medicine

## 2023-07-29 ENCOUNTER — Ambulatory Visit: Payer: 59 | Admitting: Family Medicine

## 2023-08-10 DIAGNOSIS — R269 Unspecified abnormalities of gait and mobility: Secondary | ICD-10-CM | POA: Diagnosis not present

## 2023-08-17 ENCOUNTER — Telehealth: Payer: Self-pay | Admitting: Family Medicine

## 2023-08-17 NOTE — Telephone Encounter (Signed)
Patient wants to come in before his appointment to get his A1c checked. Please advise

## 2023-08-19 ENCOUNTER — Ambulatory Visit (INDEPENDENT_AMBULATORY_CARE_PROVIDER_SITE_OTHER): Payer: 59 | Admitting: Family Medicine

## 2023-08-19 ENCOUNTER — Encounter: Payer: Self-pay | Admitting: Family Medicine

## 2023-08-19 VITALS — BP 146/76 | HR 84 | Ht 75.0 in | Wt 273.0 lb

## 2023-08-19 DIAGNOSIS — L84 Corns and callosities: Secondary | ICD-10-CM

## 2023-08-19 DIAGNOSIS — R7303 Prediabetes: Secondary | ICD-10-CM

## 2023-08-19 DIAGNOSIS — E1169 Type 2 diabetes mellitus with other specified complication: Secondary | ICD-10-CM | POA: Diagnosis not present

## 2023-08-19 DIAGNOSIS — E1159 Type 2 diabetes mellitus with other circulatory complications: Secondary | ICD-10-CM

## 2023-08-19 DIAGNOSIS — I152 Hypertension secondary to endocrine disorders: Secondary | ICD-10-CM | POA: Diagnosis not present

## 2023-08-19 LAB — POCT GLYCOSYLATED HEMOGLOBIN (HGB A1C): HbA1c, POC (prediabetic range): 6.6 % — AB (ref 5.7–6.4)

## 2023-08-19 NOTE — Assessment & Plan Note (Signed)
Blood pressure is well-controlled.  Continue lisinopril at current strength. 

## 2023-08-19 NOTE — Progress Notes (Signed)
James Ware - 62 y.o. male MRN [id number]  Date of birth: 21-Nov-1960  Subjective Chief Complaint  Patient presents with   Hypertension   Diabetes    HPI James Ware is a 62 y.o. male here today for follow up visit.   Reports that he is doing okay.  He is likely going to need a repeat knee surgery for hardware failure.   He continues on hydrochlorothiazide for treatment of HTN.  Blood pressure remains well-controlled.  Denies side effects from medication at current strength.  He has not had chest pain, shortness of breath, palpitations, headaches or vision changes.    Blood sugars are better controlled.  A1c down to 6.6%.  He is confident he can get this lower once he is able to exercise more regularly.  ROS:  A comprehensive ROS was completed and negative except as noted per HPI  No Known Allergies  Past Medical History:  Diagnosis Date   Arthritis    left knee   Eczema 05/24/2017   Essential hypertension 05/24/2017   Hyperlipidemia    Prediabetes 05/24/2017   Venous stasis dermatitis of both lower extremities 05/24/2017    Past Surgical History:  Procedure Laterality Date   COLONOSCOPY     TONSILLECTOMY     in first grade   TYMPANOSTOMY TUBE PLACEMENT     as a child   WISDOM TOOTH EXTRACTION      Social History   Socioeconomic History   Marital status: Divorced    Spouse name: Not on file   Number of children: Not on file   Years of education: Not on file   Highest education level: Professional school degree (e.g., MD, DDS, DVM, JD)  Occupational History   Not on file  Tobacco Use   Smoking status: Former   Smokeless tobacco: Never  Vaping Use   Vaping status: Never Used  Substance and Sexual Activity   Alcohol use: Yes    Comment: 1-2 drinks per week   Drug use: Never   Sexual activity: Not on file  Other Topics Concern   Not on file  Social History Narrative   Not on file   Social Determinants of Health   Financial Resource Strain: Low Risk   (08/16/2023)   Overall Financial Resource Strain (CARDIA)    Difficulty of Paying Living Expenses: Not hard at all  Food Insecurity: No Food Insecurity (08/16/2023)   Hunger Vital Sign    Worried About Running Out of Food in the Last Year: Never true    Ran Out of Food in the Last Year: Never true  Transportation Needs: No Transportation Needs (08/16/2023)   PRAPARE - Administrator, Civil Service (Medical): No    Lack of Transportation (Non-Medical): No  Physical Activity: Insufficiently Active (08/16/2023)   Exercise Vital Sign    Days of Exercise per Week: 3 days    Minutes of Exercise per Session: 30 min  Stress: No Stress Concern Present (08/16/2023)   [employer] of Occupational Health - Occupational Stress Questionnaire    Feeling of Stress : Only a little  Social Connections: Moderately Integrated (08/16/2023)   Social Connection and Isolation Panel [NHANES]    Frequency of Communication with Friends and Family: More than three times a week    Frequency of Social Gatherings with Friends and Family: Once a week    Attends Religious Services: More than 4 times per year    Active Member of [employer] or Organizations: Yes  Attends Banker Meetings: More than 4 times per year    Marital Status: Divorced    Family History  Problem Relation Age of Onset   Colon cancer Mother 36   Hyperlipidemia Mother    Cancer Father        bladder   Esophageal cancer Neg Hx    Rectal cancer Neg Hx    Stomach cancer Neg Hx     Health Maintenance  Topic Date Due   Diabetic kidney evaluation - eGFR measurement  09/12/2023   Diabetic kidney evaluation - Urine ACR  09/12/2023   HIV Screening  04/20/2024 (Originally 04/26/1974)   OPHTHALMOLOGY EXAM  10/10/2023   HEMOGLOBIN A1C  02/16/2024   FOOT EXAM  08/18/2024   Colonoscopy  09/12/2027   DTaP/Tdap/Td (3 - Td or Tdap) 10/31/2030   INFLUENZA VACCINE  Completed   COVID-19 Vaccine  Completed   Hepatitis C  Screening  Completed   Zoster Vaccines- Shingrix  Completed   HPV VACCINES  Aged Out     ----------------------------------------------------------------------------------------------------------------------------------------------------------------------------------------------------------------- Physical Exam BP (!) 146/76 (BP Location: Left Arm, Patient Position: Sitting, Cuff Size: Normal)   Pulse 84   Ht 6\' 3"  (1.905 m)   Wt 273 lb (123.8 kg)   SpO2 99%   BMI 34.12 kg/m   Physical Exam Constitutional:      Appearance: Normal appearance.  Cardiovascular:     Rate and Rhythm: Normal rate and regular rhythm.  Pulmonary:     Effort: Pulmonary effort is normal.     Breath sounds: Normal breath sounds.  Neurological:     Mental Status: He is alert.  Psychiatric:        Mood and Affect: Mood normal.        Behavior: Behavior normal.     ------------------------------------------------------------------------------------------------------------------------------------------------------------------------------------------------------------------- Assessment and Plan  Hypertension associated with diabetes (HCC) Blood pressure is well-controlled.  Continue lisinopril at current strength.  Type 2 diabetes mellitus with other specified complication (HCC) Blood sugars improved.  Encouraged continued dietary changes.   No orders of the defined types were placed in this encounter.   Return in about 3 months (around 11/19/2023) for Annual Exam.    This visit occurred during the SARS-CoV-2 public health emergency.  Safety protocols were in place, including screening questions prior to the visit, additional usage of staff PPE, and extensive cleaning of exam room while observing appropriate contact time as indicated for disinfecting solutions.

## 2023-08-19 NOTE — Telephone Encounter (Signed)
Pls contact pt to schedule prediabetes follow-up with Dr. Ashley Royalty with fasting labs. Thanks

## 2023-08-19 NOTE — Assessment & Plan Note (Signed)
Blood sugars improved.  Encouraged continued dietary changes.

## 2023-09-09 DIAGNOSIS — R269 Unspecified abnormalities of gait and mobility: Secondary | ICD-10-CM | POA: Diagnosis not present

## 2023-10-01 DIAGNOSIS — Z0100 Encounter for examination of eyes and vision without abnormal findings: Secondary | ICD-10-CM | POA: Diagnosis not present

## 2023-10-01 LAB — HM DIABETES EYE EXAM

## 2023-10-14 DIAGNOSIS — T148XXA Other injury of unspecified body region, initial encounter: Secondary | ICD-10-CM | POA: Diagnosis not present

## 2023-10-14 DIAGNOSIS — S82042A Displaced comminuted fracture of left patella, initial encounter for closed fracture: Secondary | ICD-10-CM | POA: Diagnosis not present

## 2023-10-19 NOTE — Therapy (Signed)
OUTPATIENT PHYSICAL THERAPY LOWER EXTREMITY EVALUATION   Patient Name: James Ware MRN: [id number] DOB:Oct 09, 1960, 63 y.o., male Today's Date: 10/20/2023  END OF SESSION:  PT End of Session - 10/20/23 0843     Visit Number 1    Number of Visits 9    Date for PT Re-Evaluation 12/15/23    Authorization Type aetna    Authorization Time Period 30VL no auth required    Authorization - Visit Number 1    Authorization - Number of Visits 30    PT Start Time 0845    PT Stop Time 0940    PT Time Calculation (min) 55 min    Activity Tolerance Patient tolerated treatment well             Past Medical History:  Diagnosis Date   Arthritis    left knee   Eczema 05/24/2017   Essential hypertension 05/24/2017   Hyperlipidemia    Prediabetes 05/24/2017   Venous stasis dermatitis of both lower extremities 05/24/2017   Past Surgical History:  Procedure Laterality Date   COLONOSCOPY     TONSILLECTOMY     in first grade   TYMPANOSTOMY TUBE PLACEMENT     as a child   WISDOM TOOTH EXTRACTION     Patient Active Problem List   Diagnosis Date Noted   Closed patellar sleeve fracture of left knee 11/30/2022   Primary osteoarthritis of left hip 12/05/2021   Well adult exam 08/17/2021   Actinic skin damage - scalp and forehead, cryotherapy applied 02/13/21 02/13/2021   Seasonal allergic rhinitis 05/22/2019   Erectile dysfunction 04/11/2019   Hearing loss 12/16/2017   HLD (hyperlipidemia) 05/27/2017   Hypertension associated with diabetes (HCC) 05/24/2017   Eczema 05/24/2017   Venous stasis dermatitis of both lower extremities 05/24/2017   Type 2 diabetes mellitus with other specified complication (HCC) 05/24/2017    PCP: Everrett Coombe, DO  REFERRING PROVIDER: Vale Haven, MD  REFERRING DIAG: Displaced comminuted fracture of left patella, initial encounter for closed fracture (N82.956O) ;    THERAPY DIAG:  Left knee pain, unspecified chronicity  Other abnormalities  of gait and mobility  Rationale for Evaluation and Treatment: Rehabilitation  ONSET DATE: March 2024  SUBJECTIVE:   SUBJECTIVE STATEMENT: Pt describes himself as historically quite active - enjoys jogging, golfing, playing tennis. States in March of 2024 he had a fall on a sidewalk and fractured his patella, requiring ORIF. States he was receiving PT, everything going well overall until around August, when follow up imaging showed non-union of fracture. Pt states he continued to improve with PT and fracture healing was monitored with follow up imaging, most recent a couple weeks ago. He endorses that imaging has not been decisive re: fracture healing and he may end up having another surgery. He states his surgeon would like for him to return to PT to maximize quad strength and improve balance (ran out of insurance visits last year). He states he doesn't really have much pain overall, primary issue is a "hitch" or "catch" at times, will also have swelling if he overdoes it. States with prior bout of PT he was working on balance, resistance bands, closed chain movements, bike, and mobility work. He denies any formal restrictions at this point.   PERTINENT HISTORY: HTN, DM, patellar fracture L knee March 2024 w/ ORIF  PAIN:  Are you having pain: none Location/description: R knee Best-worst over past week: 0-5/10  - aggravating factors: leading w L leg up stairs, STS  and first few steps, uneven surfaces, walking down decline - Easing factors: rest, medication  PRECAUTIONS: pt denies any specific restrictions Per paper referral special instructions: "balance/gait, quadriceps strengthening, knee stabilization exercises LLE"  WEIGHT BEARING RESTRICTIONS: No  FALLS:  Has patient fallen in last 6 months? No  LIVING ENVIRONMENT: Has a roommate/partner ; 1STE, level home  OCCUPATION: was an Catering manager for [employer], currently looking for work with history consulting  PLOF:  Independent  PATIENT GOALS: get back to playing tennis, walking more. Enjoys being active  NEXT MD VISIT: end of March  OBJECTIVE:  Note: Objective measures were completed at Evaluation unless otherwise noted.  DIAGNOSTIC FINDINGS:  No recent imaging in chart - pt states he has had follow up imaging since surgery, most recent a couple of weeks ago. States he will bring in radiology impression L patellar ORIF 12/02/22   PATIENT SURVEYS:  FOTO deferred at eval - not yet set up in system  COGNITION: Overall cognitive status: Within functional limits for tasks assessed     SENSATION: Does not endorse any sensory complaints  EDEMA:  Endorses fluctuating edema about knee - not significant on observation today   LOWER EXTREMITY ROM:     Active  Right eval Left eval  Hip flexion    Hip extension    Hip internal rotation    Hip external rotation    Knee extension full Full   Knee flexion  129 painless   (Blank rows = not tested) (Key: WFL = within functional limits not formally assessed, * = concordant pain, s = stiffness/stretching sensation, NT = not tested)  Comments:    LOWER EXTREMITY MMT:    MMT Right eval Left eval  Hip flexion    Hip abduction (modified sitting)    Hip internal rotation    Hip external rotation    Knee flexion 5 3+ (mild pinch, no overt pain)  Knee extension 5 5  Ankle dorsiflexion     (Blank rows = not tested) (Key: WFL = within functional limits not formally assessed, * = concordant pain, s = stiffness/stretching sensation, NT = not tested)  Comments: with L knee ext MMT - minimal PT resistance applied, gradual build  LOWER EXTREMITY SPECIAL TESTS:  SLR without lag or pain, 10 reps equal BIL  FUNCTIONAL TESTS:  5xSTS: 16.26sec no UE support, mild popping but no pain  GAIT: Distance walked: within clinic Assistive device utilized: None Level of assistance: Complete Independence Comments: mildly reduced stance time LLE, occasional  hyperextension L knee in stance   FGA on eval:  -Item 1 Gait Level Surface: mild impairment 2  -Item 2 Change in Gait Speed: mild impairment 2  -Item 3 Gait with Horizontal Head Turns: mild impairment 2  -Item 4 Gait with Vertical Head Turns: Normal 3  -Item 5 Gait with Pivot Turn: Normal 3  -Item 6 Step Over Obstacle: mild impairment 2  -Item 7 Gait with Narrow Base of Support: Normal 3  -Item 8 Gait with Eyes Closed: mild impairment 2             -Item 9 Ambulating Backwards: mild impairment 2  -Item 10 Steps: moderate impairment 1  (pt self report) Total: 22 /30  * Score of <=22/30 indicates that patient is at increased risk for falls.  TREATMENT DATE:  [facility] Adult PT Treatment:                                                DATE: 10/20/23 Therapeutic Exercise: SLR, heel raises, Quarry manager reps, HEP handout + education  Therapeutic Activity: Significant time w/ discussion/education re: impairments, 5xSTS and FGA as they relate to functional mobility, strategies to improve tolerance, principles of gradual progression with respect to symptom behavior/tolerance    PATIENT EDUCATION:  Education details: Pt education on PT impairments, prognosis, and POC. Informed consent. Rationale for interventions, safe/appropriate HEP performance Person educated: Patient Education method: Explanation, Demonstration, Tactile cues, Verbal cues Education comprehension: verbalized understanding, returned demonstration, verbal cues required, tactile cues required, and needs further education    HOME EXERCISE PROGRAM: Access Code: [email or web address] URL: [email or web address] Date: 10/20/2023 Prepared by: Fransisco Hertz  Exercises - Active Straight Leg Raise with Quad Set  - 2-3 x daily - 1 sets - 10 reps - Standing March with Counter Support  - 2-3 x daily - 1  sets - 10 reps - Heel Raises with Counter Support  - 2-3 x daily - 1 sets - 10 reps  ASSESSMENT:  CLINICAL IMPRESSION: Patient is a pleasant 63 y.o. gentleman who was seen today for physical therapy evaluation and treatment for balance/gait in context of L patellar fracture w/ ORIF March 2024. Pt denies formal restrictions at this point but states he may require additional surgery for non-union, goal of PT is to maximize quad strength and functional mobility. He states at present he notes the most difficulty with stair navigation, incline/declines, uneven surfaces, and sit<>stand after prolonged sitting. Doesn't have much pain but does have occasional swelling and popping. On exam he demonstrates significant reduction in L quad strength although does have good ROM. 5xSTS is within fall risk category (12-14sec cutoff score). FGA score is at cutoff for fall risk (22/30), demonstrates mild instability with dynamic tasks but no overt LOB. At present recommend skilled PT to address relevant impairments with focus on quad stability and functional mobility. Pt tolerates exam/HEP well without adverse event or increase in resting pain. Pt departs today's session in no acute distress, all voiced questions/concerns addressed appropriately from PT perspective.    OBJECTIVE IMPAIRMENTS: Abnormal gait, decreased activity tolerance, decreased balance, decreased endurance, difficulty walking, decreased strength, improper body mechanics, and pain.   ACTIVITY LIMITATIONS: carrying, lifting, squatting, stairs, transfers, and locomotion level  PARTICIPATION LIMITATIONS: meal prep, cleaning, laundry, and community activity  PERSONAL FACTORS: Age, Time since onset of injury/illness/exacerbation, and 3+ comorbidities: HTN, DM, patellar fracture L knee  are also affecting patient's functional outcome.   REHAB POTENTIAL: Fair given chronicity, surgical history, and comorbidities  CLINICAL DECISION MAKING:  Stable/uncomplicated  EVALUATION COMPLEXITY: Low   GOALS:  SHORT TERM GOALS: Target date: 11/17/2023 Pt will demonstrate appropriate understanding and performance of initially prescribed HEP in order to facilitate improved independence with management of symptoms.  Baseline: HEP provided on eval Goal status: INITIAL   2. Pt will be able to perform 5xSTS in </= 14sec in order to facilitate reduced fall risk and improved functional mobility.  Baseline: 16sec  Goal status: INITIAL  LONG TERM GOALS: Target date: 12/15/2023 Pt will meet predicted score or greater on FOTO in order to demonstrate improved perception of function due to symptoms.  Baseline: FOTO TBD Goal status: INITIAL  2.  Pt will report ability to navigate up to 1 flight of stairs w/ reciprocal pattern and less than 2pt increase in resting pain in order to facilitate improved community access. Baseline: reports reciprocal pattern and rail use Goal status: INITIAL  3.  Pt will score greater than or equal to 26/30 on Functional Gait assessment in order to indicate reduced fall risk (cutoff score </= 22/30 predictive of falls per Alvino Chapel et al 2010, MCID 4 pts Beninato et al 2014)  Baseline: 22/30  Goal status: INITIAL   4.  Pt will be able to perform 5xSTS in less than or equal to 12 in order to demonstrate reduced fall risk and improved functional independence (MCID 5xSTS = 2.3 sec). Baseline: 16sec Goal status: INITIAL    PLAN:  PT FREQUENCY: 1x/week  PT DURATION: 8 weeks  PLANNED INTERVENTIONS: 97164- PT Re-evaluation, 97110-Therapeutic exercises, 97530- Therapeutic activity, [id number]- Neuromuscular re-education, 97535- Self Care, [id number]- Manual therapy, [phone]- Gait training, [phone]- Aquatic Therapy, Patient/Family education, Balance training, Stair training, Taping, Dry Needling, Joint mobilization, Scar mobilization, Cryotherapy, and Moist heat  PLAN FOR NEXT SESSION: Review/update HEP PRN. Working on quad  stability/strength, balance. Symptom modification strategies as indicated/appropriate. Mindful of ORIF/fracture, recommend close monitoring of symptom response and swelling with activities, modifications as needed.    Ashley Murrain PT, DPT 10/20/2023 11:04 AM

## 2023-10-20 ENCOUNTER — Ambulatory Visit: Payer: 59 | Attending: Orthopedic Surgery | Admitting: Physical Therapy

## 2023-10-20 ENCOUNTER — Encounter: Payer: Self-pay | Admitting: Physical Therapy

## 2023-10-20 ENCOUNTER — Other Ambulatory Visit: Payer: Self-pay

## 2023-10-20 DIAGNOSIS — M25562 Pain in left knee: Secondary | ICD-10-CM | POA: Diagnosis not present

## 2023-10-20 DIAGNOSIS — R2689 Other abnormalities of gait and mobility: Secondary | ICD-10-CM | POA: Diagnosis not present

## 2023-10-26 NOTE — Therapy (Signed)
OUTPATIENT PHYSICAL THERAPY TREATMENT   Patient Name: James Ware MRN: [id number] DOB:12-15-1960, 63 y.o., male Today's Date: 10/27/2023  END OF SESSION:  PT End of Session - 10/27/23 0848     Visit Number 2    Number of Visits 9    Date for PT Re-Evaluation 12/15/23    Authorization Type aetna    Authorization Time Period 30VL no auth required    Authorization - Visit Number 2    Authorization - Number of Visits 30    PT Start Time 0848    PT Stop Time 0928    PT Time Calculation (min) 40 min    Activity Tolerance Patient tolerated treatment well              Past Medical History:  Diagnosis Date   Arthritis    left knee   Eczema 05/24/2017   Essential hypertension 05/24/2017   Hyperlipidemia    Prediabetes 05/24/2017   Venous stasis dermatitis of both lower extremities 05/24/2017   Past Surgical History:  Procedure Laterality Date   COLONOSCOPY     TONSILLECTOMY     in first grade   TYMPANOSTOMY TUBE PLACEMENT     as a child   WISDOM TOOTH EXTRACTION     Patient Active Problem List   Diagnosis Date Noted   Closed patellar sleeve fracture of left knee 11/30/2022   Primary osteoarthritis of left hip 12/05/2021   Well adult exam 08/17/2021   Actinic skin damage - scalp and forehead, cryotherapy applied 02/13/21 02/13/2021   Seasonal allergic rhinitis 05/22/2019   Erectile dysfunction 04/11/2019   Hearing loss 12/16/2017   HLD (hyperlipidemia) 05/27/2017   Hypertension associated with diabetes (HCC) 05/24/2017   Eczema 05/24/2017   Venous stasis dermatitis of both lower extremities 05/24/2017   Type 2 diabetes mellitus with other specified complication (HCC) 05/24/2017    PCP: Everrett Coombe, DO  REFERRING PROVIDER: Vale Haven, MD  REFERRING DIAG: Displaced comminuted fracture of left patella, initial encounter for closed fracture (W09.811B) ;    THERAPY DIAG:  Left knee pain, unspecified chronicity  Other abnormalities of gait and  mobility  Rationale for Evaluation and Treatment: Rehabilitation  ONSET DATE: March 2024  SUBJECTIVE:   Per eval - Pt describes himself as historically quite active - enjoys jogging, golfing, playing tennis. States in March of 2024 he had a fall on a sidewalk and fractured his patella, requiring ORIF. States he was receiving PT, everything going well overall until around August, when follow up imaging showed non-union of fracture. Pt states he continued to improve with PT and fracture healing was monitored with follow up imaging, most recent a couple weeks ago. He endorses that imaging has not been decisive re: fracture healing and he may end up having another surgery. He states his surgeon would like for him to return to PT to maximize quad strength and improve balance (ran out of insurance visits last year). He states he doesn't really have much pain overall, primary issue is a "hitch" or "catch" at times, will also have swelling if he overdoes it. States with prior bout of PT he was working on balance, resistance bands, closed chain movements, bike, and mobility work. He denies any formal restrictions at this point.   SUBJECTIVE STATEMENT: 10/27/2023 pt states he did have a fall a few days ago where he tripped over a table, was able to catch himself and denies any change in status. Denies any issues w/ HEP. No other new  updates   PERTINENT HISTORY: HTN, DM, patellar fracture L knee March 2024 w/ ORIF  PAIN:  Are you having pain: no pain at present  Per eval -  Location/description: R knee Best-worst over past week: 0-5/10  - aggravating factors: leading w L leg up stairs, STS and first few steps, uneven surfaces, walking down decline - Easing factors: rest, medication  PRECAUTIONS: pt denies any specific restrictions Per paper referral special instructions: "balance/gait, quadriceps strengthening, knee stabilization exercises LLE"  WEIGHT BEARING RESTRICTIONS: No  FALLS:  Has patient  fallen in last 6 months? No  LIVING ENVIRONMENT: Has a roommate/partner ; 1STE, level home  OCCUPATION: was an Catering manager for [employer], currently looking for work with history consulting  PLOF: Independent  PATIENT GOALS: get back to playing tennis, walking more. Enjoys being active  NEXT MD VISIT: end of March  OBJECTIVE:  Note: Objective measures were completed at Evaluation unless otherwise noted.  DIAGNOSTIC FINDINGS:  No recent imaging in chart - pt states he has had follow up imaging since surgery, most recent a couple of weeks ago. States he will bring in radiology impression L patellar ORIF 12/02/22   PATIENT SURVEYS:  FOTO deferred at eval - not yet set up in system  COGNITION: Overall cognitive status: Within functional limits for tasks assessed     SENSATION: Does not endorse any sensory complaints  EDEMA:  Endorses fluctuating edema about knee - not significant on observation today   LOWER EXTREMITY ROM:     Active  Right eval Left eval  Hip flexion    Hip extension    Hip internal rotation    Hip external rotation    Knee extension full Full   Knee flexion  129 painless   (Blank rows = not tested) (Key: WFL = within functional limits not formally assessed, * = concordant pain, s = stiffness/stretching sensation, NT = not tested)  Comments:    LOWER EXTREMITY MMT:    MMT Right eval Left eval  Hip flexion    Hip abduction (modified sitting)    Hip internal rotation    Hip external rotation    Knee flexion 5 3+ (mild pinch, no overt pain)  Knee extension 5 5  Ankle dorsiflexion     (Blank rows = not tested) (Key: WFL = within functional limits not formally assessed, * = concordant pain, s = stiffness/stretching sensation, NT = not tested)  Comments: with L knee ext MMT - minimal PT resistance applied, gradual build  LOWER EXTREMITY SPECIAL TESTS:  SLR without lag or pain, 10 reps equal BIL  FUNCTIONAL TESTS:  5xSTS: 16.26sec no UE support,  mild popping but no pain  GAIT: Distance walked: within clinic Assistive device utilized: None Level of assistance: Complete Independence Comments: mildly reduced stance time LLE, occasional hyperextension L knee in stance   FGA on eval:  -Item 1 Gait Level Surface: mild impairment 2  -Item 2 Change in Gait Speed: mild impairment 2  -Item 3 Gait with Horizontal Head Turns: mild impairment 2  -Item 4 Gait with Vertical Head Turns: Normal 3  -Item 5 Gait with Pivot Turn: Normal 3  -Item 6 Step Over Obstacle: mild impairment 2  -Item 7 Gait with Narrow Base of Support: Normal 3  -Item 8 Gait with Eyes Closed: mild impairment 2             -Item 9 Ambulating Backwards: mild impairment 2  -Item 10 Steps: moderate impairment 1  (pt self  report) Total: 22 /30  * Score of <=22/30 indicates that patient is at increased risk for falls.                                                                                                                                 TREATMENT DATE:  [facility] Adult PT Treatment:                                                DATE: 10/27/23 Therapeutic Exercise: SLR 2x10 STS raised surface (chair + 3 pads) 3x5 cues for pacing, positioning Retrowalking 4x38ft with cues for quad extension, pacing, posture, and appropriate step length Mini lunge LLE fwd x15 w/ UE support  Mini squat at counter w/ UE support x8 cues for comfortable ROM, BOS, and mechanics HEP update + education/handout Increased time w/ education/discussion re: relevant anatomy/physiology, rationale for interventions, monitoring response to exercises and appropriate modifications as needed  [facility] Adult PT Treatment:                                                DATE: 10/20/23 Therapeutic Exercise: SLR, heel raises, Quarry manager reps, HEP handout + education  Therapeutic Activity: Significant time w/ discussion/education re: impairments, 5xSTS and FGA as they relate to functional mobility,  strategies to improve tolerance, principles of gradual progression with respect to symptom behavior/tolerance    PATIENT EDUCATION:  Education details: rationale for interventions, HEP  Person educated: Patient Education method: Explanation, Demonstration, Tactile cues, Verbal cues Education comprehension: verbalized understanding, returned demonstration, verbal cues required, tactile cues required, and needs further education     HOME EXERCISE PROGRAM: Access Code: [email or web address] URL: [email or web address] Date: 10/27/2023 Prepared by: Fransisco Hertz  Exercises - Active Straight Leg Raise with Quad Set  - 2-3 x daily - 1 sets - 10 reps - Standing March with Counter Support  - 2-3 x daily - 1 sets - 10 reps - Heel Raises with Counter Support  - 2-3 x daily - 1 sets - 10 reps - Backward Walking with Counter Support  - 2-3 x daily - 1 sets - 10 reps  ASSESSMENT:  CLINICAL IMPRESSION: 10/27/2023 Pt arrives w/o pain, reports a fall since initial eval but no overall change in status. Does well with today's exercises, emphasizing comfortable quad activation while minimizing direct patellar torque as able. Requires cues to increase quad contraction w/ SLR, maintain comfortable ROM with closed chain movements. No adverse events, pt tolerates exercises well without increase in pain. Recommend continuing along current POC in order to address relevant deficits and improve functional tolerance. Pt departs today's session in no acute distress, all voiced questions/concerns addressed appropriately from PT  perspective.    Per eval - Patient is a pleasant 62 y.o. gentleman who was seen today for physical therapy evaluation and treatment for balance/gait in context of L patellar fracture w/ ORIF March 2024. Pt denies formal restrictions at this point but states he may require additional surgery for non-union, goal of PT is to maximize quad strength and functional mobility. He states at present he  notes the most difficulty with stair navigation, incline/declines, uneven surfaces, and sit<>stand after prolonged sitting. Doesn't have much pain but does have occasional swelling and popping. On exam he demonstrates significant reduction in L quad strength although does have good ROM. 5xSTS is within fall risk category (12-14sec cutoff score). FGA score is at cutoff for fall risk (22/30), demonstrates mild instability with dynamic tasks but no overt LOB. At present recommend skilled PT to address relevant impairments with focus on quad stability and functional mobility. Pt tolerates exam/HEP well without adverse event or increase in resting pain. Pt departs today's session in no acute distress, all voiced questions/concerns addressed appropriately from PT perspective.    OBJECTIVE IMPAIRMENTS: Abnormal gait, decreased activity tolerance, decreased balance, decreased endurance, difficulty walking, decreased strength, improper body mechanics, and pain.   ACTIVITY LIMITATIONS: carrying, lifting, squatting, stairs, transfers, and locomotion level  PARTICIPATION LIMITATIONS: meal prep, cleaning, laundry, and community activity  PERSONAL FACTORS: Age, Time since onset of injury/illness/exacerbation, and 3+ comorbidities: HTN, DM, patellar fracture L knee  are also affecting patient's functional outcome.   REHAB POTENTIAL: Fair given chronicity, surgical history, and comorbidities  CLINICAL DECISION MAKING: Stable/uncomplicated  EVALUATION COMPLEXITY: Low   GOALS:  SHORT TERM GOALS: Target date: 11/17/2023 Pt will demonstrate appropriate understanding and performance of initially prescribed HEP in order to facilitate improved independence with management of symptoms.  Baseline: HEP provided on eval Goal status: INITIAL   2. Pt will be able to perform 5xSTS in </= 14sec in order to facilitate reduced fall risk and improved functional mobility.  Baseline: 16sec  Goal status: INITIAL  LONG TERM  GOALS: Target date: 12/15/2023 Pt will meet predicted score or greater on FOTO in order to demonstrate improved perception of function due to symptoms.  Baseline: FOTO TBD Goal status: INITIAL  2.  Pt will report ability to navigate up to 1 flight of stairs w/ reciprocal pattern and less than 2pt increase in resting pain in order to facilitate improved community access. Baseline: reports reciprocal pattern and rail use Goal status: INITIAL  3.  Pt will score greater than or equal to 26/30 on Functional Gait assessment in order to indicate reduced fall risk (cutoff score </= 22/30 predictive of falls per Alvino Chapel et al 2010, MCID 4 pts Beninato et al 2014)  Baseline: 22/30  Goal status: INITIAL   4.  Pt will be able to perform 5xSTS in less than or equal to 12 in order to demonstrate reduced fall risk and improved functional independence (MCID 5xSTS = 2.3 sec). Baseline: 16sec Goal status: INITIAL    PLAN:  PT FREQUENCY: 1x/week  PT DURATION: 8 weeks  PLANNED INTERVENTIONS: 97164- PT Re-evaluation, 97110-Therapeutic exercises, 97530- Therapeutic activity, [id number]- Neuromuscular re-education, 97535- Self Care, 95621- Manual therapy, [phone]- Gait training, [phone]- Aquatic Therapy, Patient/Family education, Balance training, Stair training, Taping, Dry Needling, Joint mobilization, Scar mobilization, Cryotherapy, and Moist heat  PLAN FOR NEXT SESSION: Review/update HEP PRN. Working on quad stability/strength, balance. Symptom modification strategies as indicated/appropriate. Mindful of ORIF/fracture, recommend close monitoring of symptom response and swelling with activities, modifications as needed.  Ashley Murrain PT, DPT 10/27/2023 9:39 AM

## 2023-10-27 ENCOUNTER — Ambulatory Visit: Payer: 59 | Admitting: Physical Therapy

## 2023-10-27 ENCOUNTER — Encounter: Payer: Self-pay | Admitting: Physical Therapy

## 2023-10-27 DIAGNOSIS — M25562 Pain in left knee: Secondary | ICD-10-CM | POA: Diagnosis not present

## 2023-10-27 DIAGNOSIS — R2689 Other abnormalities of gait and mobility: Secondary | ICD-10-CM

## 2023-11-03 ENCOUNTER — Encounter: Payer: Self-pay | Admitting: Physical Therapy

## 2023-11-03 ENCOUNTER — Ambulatory Visit: Payer: 59 | Attending: Orthopedic Surgery | Admitting: Physical Therapy

## 2023-11-03 DIAGNOSIS — R2689 Other abnormalities of gait and mobility: Secondary | ICD-10-CM | POA: Insufficient documentation

## 2023-11-03 DIAGNOSIS — M25562 Pain in left knee: Secondary | ICD-10-CM | POA: Insufficient documentation

## 2023-11-03 NOTE — Therapy (Signed)
 OUTPATIENT PHYSICAL THERAPY TREATMENT   Patient Name: James Ware MRN: [id number] DOB:11-21-1960, 63 y.o., male Today's Date: 11/03/2023  END OF SESSION:  PT End of Session - 11/03/23 0847     Visit Number 3    Number of Visits 9    Date for PT Re-Evaluation 12/15/23    Authorization Type aetna    Authorization Time Period 30VL no auth required    Authorization - Visit Number 3    Authorization - Number of Visits 30    PT Start Time 0848    PT Stop Time 0929    PT Time Calculation (min) 41 min               Past Medical History:  Diagnosis Date   Arthritis    left knee   Eczema 05/24/2017   Essential hypertension 05/24/2017   Hyperlipidemia    Prediabetes 05/24/2017   Venous stasis dermatitis of both lower extremities 05/24/2017   Past Surgical History:  Procedure Laterality Date   COLONOSCOPY     TONSILLECTOMY     in first grade   TYMPANOSTOMY TUBE PLACEMENT     as a child   WISDOM TOOTH EXTRACTION     Patient Active Problem List   Diagnosis Date Noted   Closed patellar sleeve fracture of left knee 11/30/2022   Primary osteoarthritis of left hip 12/05/2021   Well adult exam 08/17/2021   Actinic skin damage - scalp and forehead, cryotherapy applied 02/13/21 02/13/2021   Seasonal allergic rhinitis 05/22/2019   Erectile dysfunction 04/11/2019   Hearing loss 12/16/2017   HLD (hyperlipidemia) 05/27/2017   Hypertension associated with diabetes (HCC) 05/24/2017   Eczema 05/24/2017   Venous stasis dermatitis of both lower extremities 05/24/2017   Type 2 diabetes mellitus with other specified complication (HCC) 05/24/2017    PCP: Alvia Bring, DO  REFERRING PROVIDER: Katherleen Mavis NOVAK, MD  REFERRING DIAG: Displaced comminuted fracture of left patella, initial encounter for closed fracture (D17.957J) ;    THERAPY DIAG:  Left knee pain, unspecified chronicity  Other abnormalities of gait and mobility  Rationale for Evaluation and Treatment:  Rehabilitation  ONSET DATE: March 2024  SUBJECTIVE:   Per eval - Pt describes himself as historically quite active - enjoys jogging, golfing, playing tennis. States in March of 2024 he had a fall on a sidewalk and fractured his patella, requiring ORIF. States he was receiving PT, everything going well overall until around August, when follow up imaging showed non-union of fracture. Pt states he continued to improve with PT and fracture healing was monitored with follow up imaging, most recent a couple weeks ago. He endorses that imaging has not been decisive re: fracture healing and he may end up having another surgery. He states his surgeon would like for him to return to PT to maximize quad strength and improve balance (ran out of insurance visits last year). He states he doesn't really have much pain overall, primary issue is a hitch or catch at times, will also have swelling if he overdoes it. States with prior bout of PT he was working on balance, resistance bands, closed chain movements, bike, and mobility work. He denies any formal restrictions at this point.   SUBJECTIVE STATEMENT: 11/03/2023 Knee doing well, no issues after last session. Has had less swelling this week which he attributes to less heavy activities outside of session. HEP going well.    PERTINENT HISTORY: HTN, DM, patellar fracture L knee March 2024 w/ ORIF  PAIN:  Are you having pain: no pain at present  Per eval -  Location/description: R knee Best-worst over past week: 0-5/10  - aggravating factors: leading w L leg up stairs, STS and first few steps, uneven surfaces, walking down decline - Easing factors: rest, medication  PRECAUTIONS: pt denies any specific restrictions Per paper referral special instructions: balance/gait, quadriceps strengthening, knee stabilization exercises LLE  WEIGHT BEARING RESTRICTIONS: No  FALLS:  Has patient fallen in last 6 months? No  LIVING ENVIRONMENT: Has a  roommate/partner ; 1STE, level home  OCCUPATION: was an catering manager for [employer], currently looking for work with history consulting  PLOF: Independent  PATIENT GOALS: get back to playing tennis, walking more. Enjoys being active  NEXT MD VISIT: end of March  OBJECTIVE:  Note: Objective measures were completed at Evaluation unless otherwise noted.  DIAGNOSTIC FINDINGS:  No recent imaging in chart - pt states he has had follow up imaging since surgery, most recent a couple of weeks ago. States he will bring in radiology impression L patellar ORIF 12/02/22   PATIENT SURVEYS:  FOTO deferred at eval - not yet set up in system  COGNITION: Overall cognitive status: Within functional limits for tasks assessed     SENSATION: Does not endorse any sensory complaints  EDEMA:  Endorses fluctuating edema about knee - not significant on observation today   LOWER EXTREMITY ROM:     Active  Right eval Left eval  Hip flexion    Hip extension    Hip internal rotation    Hip external rotation    Knee extension full Full   Knee flexion  129 painless   (Blank rows = not tested) (Key: WFL = within functional limits not formally assessed, * = concordant pain, s = stiffness/stretching sensation, NT = not tested)  Comments:    LOWER EXTREMITY MMT:    MMT Right eval Left eval  Hip flexion    Hip abduction (modified sitting)    Hip internal rotation    Hip external rotation    Knee flexion 5 3+ (mild pinch, no overt pain)  Knee extension 5 5  Ankle dorsiflexion     (Blank rows = not tested) (Key: WFL = within functional limits not formally assessed, * = concordant pain, s = stiffness/stretching sensation, NT = not tested)  Comments: with L knee ext MMT - minimal PT resistance applied, gradual build  LOWER EXTREMITY SPECIAL TESTS:  SLR without lag or pain, 10 reps equal BIL  FUNCTIONAL TESTS:  5xSTS: 16.26sec no UE support, mild popping but no pain  GAIT: Distance walked: within  clinic Assistive device utilized: None Level of assistance: Complete Independence Comments: mildly reduced stance time LLE, occasional hyperextension L knee in stance   FGA on eval:  -Item 1 Gait Level Surface: mild impairment 2  -Item 2 Change in Gait Speed: mild impairment 2  -Item 3 Gait with Horizontal Head Turns: mild impairment 2  -Item 4 Gait with Vertical Head Turns: Normal 3  -Item 5 Gait with Pivot Turn: Normal 3  -Item 6 Step Over Obstacle: mild impairment 2  -Item 7 Gait with Narrow Base of Support: Normal 3  -Item 8 Gait with Eyes Closed: mild impairment 2             -Item 9 Ambulating Backwards: mild impairment 2  -Item 10 Steps: moderate impairment 1  (pt self report) Total: 22 /30  * Score of <=22/30 indicates that patient is at increased risk for falls.  TREATMENT DATE:  [facility] Adult PT Treatment:                                                DATE: 11/03/23 Therapeutic Exercise: SLR 2x15 cues for quad contraction, for quad endurance STS from raised mat (hips just above knees) 2x8 HEP update + education, increased time w/ education/discussion re: modification, home setup, relevant anatomy/physiology   Neuromuscular re-ed: Standing TKE into ball at wall for quad activation, 2x10 LLE cues for weight shifting, tactile cues at quad Seated bosu ball TKE push x12 Retro walking 5 laps at counter     [facility] Adult PT Treatment:                                                DATE: 10/27/23 Therapeutic Exercise: SLR 2x10 STS raised surface (chair + 3 pads) 3x5 cues for pacing, positioning Retrowalking 4x40ft with cues for quad extension, pacing, posture, and appropriate step length Mini lunge LLE fwd x15 w/ UE support  Mini squat at counter w/ UE support x8 cues for comfortable ROM, BOS, and mechanics HEP update + education/handout Increased time  w/ education/discussion re: relevant anatomy/physiology, rationale for interventions, monitoring response to exercises and appropriate modifications as needed  [facility] Adult PT Treatment:                                                DATE: 10/20/23 Therapeutic Exercise: SLR, heel raises, quarry manager reps, HEP handout + education  Therapeutic Activity: Significant time w/ discussion/education re: impairments, 5xSTS and FGA as they relate to functional mobility, strategies to improve tolerance, principles of gradual progression with respect to symptom behavior/tolerance    PATIENT EDUCATION:  Education details: rationale for interventions, HEP  Person educated: Patient Education method: Explanation, Demonstration, Tactile cues, Verbal cues Education comprehension: verbalized understanding, returned demonstration, verbal cues required, tactile cues required, and needs further education     HOME EXERCISE PROGRAM: Access Code: [email or web address] URL: [email or web address] Date: 11/03/2023 Prepared by: Alm Jenny  Exercises - Active Straight Leg Raise with Quad Set  - 2-3 x daily - 1 sets - 10 reps - Heel Raises with Counter Support  - 2-3 x daily - 1 sets - 10 reps - Backward Walking with Counter Support  - 2-3 x daily - 1 sets - 10 reps - Standing Terminal Knee Extension at Wall with Ball  - 2-3 x daily - 1 sets - 10 reps - Sit to Stand with Armchair  - 2-3 x daily - 1 sets - 5-8 reps  ASSESSMENT:  CLINICAL IMPRESSION: 11/03/2023 Pt arrives w/o pain, doing well since last visit. Today progressing familiar exercises for quad endurance, increased complexity with postural stability exercises. Also progressing quad activation w/ resisted TKE which pt does well with. No adverse events, no pain on departure. Recommend continuing along current POC in order to address relevant deficits and improve functional tolerance. Pt departs today's session in no acute distress, all voiced  questions/concerns addressed appropriately from PT perspective.     Per eval - Patient is a pleasant 63 y.o. gentleman who  was seen today for physical therapy evaluation and treatment for balance/gait in context of L patellar fracture w/ ORIF March 2024. Pt denies formal restrictions at this point but states he may require additional surgery for non-union, goal of PT is to maximize quad strength and functional mobility. He states at present he notes the most difficulty with stair navigation, incline/declines, uneven surfaces, and sit<>stand after prolonged sitting. Doesn't have much pain but does have occasional swelling and popping. On exam he demonstrates significant reduction in L quad strength although does have good ROM. 5xSTS is within fall risk category (12-14sec cutoff score). FGA score is at cutoff for fall risk (22/30), demonstrates mild instability with dynamic tasks but no overt LOB. At present recommend skilled PT to address relevant impairments with focus on quad stability and functional mobility. Pt tolerates exam/HEP well without adverse event or increase in resting pain. Pt departs today's session in no acute distress, all voiced questions/concerns addressed appropriately from PT perspective.    OBJECTIVE IMPAIRMENTS: Abnormal gait, decreased activity tolerance, decreased balance, decreased endurance, difficulty walking, decreased strength, improper body mechanics, and pain.   ACTIVITY LIMITATIONS: carrying, lifting, squatting, stairs, transfers, and locomotion level  PARTICIPATION LIMITATIONS: meal prep, cleaning, laundry, and community activity  PERSONAL FACTORS: Age, Time since onset of injury/illness/exacerbation, and 3+ comorbidities: HTN, DM, patellar fracture L knee  are also affecting patient's functional outcome.   REHAB POTENTIAL: Fair given chronicity, surgical history, and comorbidities  CLINICAL DECISION MAKING: Stable/uncomplicated  EVALUATION COMPLEXITY:  Low   GOALS:  SHORT TERM GOALS: Target date: 11/17/2023 Pt will demonstrate appropriate understanding and performance of initially prescribed HEP in order to facilitate improved independence with management of symptoms.  Baseline: HEP provided on eval Goal status: INITIAL   2. Pt will be able to perform 5xSTS in </= 14sec in order to facilitate reduced fall risk and improved functional mobility.  Baseline: 16sec  Goal status: INITIAL  LONG TERM GOALS: Target date: 12/15/2023 Pt will meet predicted score or greater on FOTO in order to demonstrate improved perception of function due to symptoms.  Baseline: FOTO TBD Goal status: INITIAL  2.  Pt will report ability to navigate up to 1 flight of stairs w/ reciprocal pattern and less than 2pt increase in resting pain in order to facilitate improved community access. Baseline: reports reciprocal pattern and rail use Goal status: INITIAL  3.  Pt will score greater than or equal to 26/30 on Functional Gait assessment in order to indicate reduced fall risk (cutoff score </= 22/30 predictive of falls per Willye et al 2010, MCID 4 pts Beninato et al 2014)  Baseline: 22/30  Goal status: INITIAL   4.  Pt will be able to perform 5xSTS in less than or equal to 12 in order to demonstrate reduced fall risk and improved functional independence (MCID 5xSTS = 2.3 sec). Baseline: 16sec Goal status: INITIAL    PLAN:  PT FREQUENCY: 1x/week  PT DURATION: 8 weeks  PLANNED INTERVENTIONS: 97164- PT Re-evaluation, 97110-Therapeutic exercises, 97530- Therapeutic activity, [id number]- Neuromuscular re-education, 97535- Self Care, [id number]- Manual therapy, [phone]- Gait training, [phone]- Aquatic Therapy, Patient/Family education, Balance training, Stair training, Taping, Dry Needling, Joint mobilization, Scar mobilization, Cryotherapy, and Moist heat  PLAN FOR NEXT SESSION: Review/update HEP PRN. Working on quad stability/strength, balance. Symptom modification  strategies as indicated/appropriate. Mindful of ORIF/fracture, recommend close monitoring of symptom response and swelling with activities, modifications as needed.    Alm DELENA Jenny PT, DPT 11/03/2023 9:31 AM

## 2023-11-04 DIAGNOSIS — M1990 Unspecified osteoarthritis, unspecified site: Secondary | ICD-10-CM | POA: Diagnosis not present

## 2023-11-04 DIAGNOSIS — I10 Essential (primary) hypertension: Secondary | ICD-10-CM | POA: Diagnosis not present

## 2023-11-04 DIAGNOSIS — E1136 Type 2 diabetes mellitus with diabetic cataract: Secondary | ICD-10-CM | POA: Diagnosis not present

## 2023-11-04 DIAGNOSIS — Z809 Family history of malignant neoplasm, unspecified: Secondary | ICD-10-CM | POA: Diagnosis not present

## 2023-11-04 DIAGNOSIS — E785 Hyperlipidemia, unspecified: Secondary | ICD-10-CM | POA: Diagnosis not present

## 2023-11-04 DIAGNOSIS — Z9181 History of falling: Secondary | ICD-10-CM | POA: Diagnosis not present

## 2023-11-04 DIAGNOSIS — J302 Other seasonal allergic rhinitis: Secondary | ICD-10-CM | POA: Diagnosis not present

## 2023-11-04 DIAGNOSIS — N529 Male erectile dysfunction, unspecified: Secondary | ICD-10-CM | POA: Diagnosis not present

## 2023-11-04 DIAGNOSIS — R269 Unspecified abnormalities of gait and mobility: Secondary | ICD-10-CM | POA: Diagnosis not present

## 2023-11-04 DIAGNOSIS — Z6834 Body mass index (BMI) 34.0-34.9, adult: Secondary | ICD-10-CM | POA: Diagnosis not present

## 2023-11-04 DIAGNOSIS — E669 Obesity, unspecified: Secondary | ICD-10-CM | POA: Diagnosis not present

## 2023-11-09 NOTE — Therapy (Signed)
OUTPATIENT PHYSICAL THERAPY TREATMENT   Patient Name: James Ware MRN: [id number] DOB:[birth year], 63 y.o., male Today's Date: 11/10/2023  END OF SESSION:  PT End of Session - 11/10/23 0851     Visit Number 4    Number of Visits 9    Date for PT Re-Evaluation 12/15/23    Authorization Type aetna    Authorization Time Period 30VL no auth required    Authorization - Visit Number 4    Authorization - Number of Visits 30    PT Start Time [phone]   late check in   PT Stop Time 0930    PT Time Calculation (min) 38 min    Activity Tolerance Patient tolerated treatment well                Past Medical History:  Diagnosis Date   Arthritis    left knee   Eczema 05/24/2017   Essential hypertension 05/24/2017   Hyperlipidemia    Prediabetes 05/24/2017   Venous stasis dermatitis of both lower extremities 05/24/2017   Past Surgical History:  Procedure Laterality Date   COLONOSCOPY     TONSILLECTOMY     in first grade   TYMPANOSTOMY TUBE PLACEMENT     as a child   WISDOM TOOTH EXTRACTION     Patient Active Problem List   Diagnosis Date Noted   Closed patellar sleeve fracture of left knee 11/30/2022   Primary osteoarthritis of left hip 12/05/2021   Well adult exam 08/17/2021   Actinic skin damage - scalp and forehead, cryotherapy applied 02/13/21 02/13/2021   Seasonal allergic rhinitis 05/22/2019   Erectile dysfunction 04/11/2019   Hearing loss 12/16/2017   HLD (hyperlipidemia) 05/27/2017   Hypertension associated with diabetes (HCC) 05/24/2017   Eczema 05/24/2017   Venous stasis dermatitis of both lower extremities 05/24/2017   Type 2 diabetes mellitus with other specified complication (HCC) 05/24/2017    PCP: Everrett Coombe, DO  REFERRING PROVIDER: Vale Haven, MD  REFERRING DIAG: Displaced comminuted fracture of left patella, initial encounter for closed fracture (W41.324M) ;    THERAPY DIAG:  Left knee pain, unspecified chronicity  Other  abnormalities of gait and mobility  Rationale for Evaluation and Treatment: Rehabilitation  ONSET DATE: March 2024  SUBJECTIVE:   Per eval - Pt describes himself as historically quite active - enjoys jogging, golfing, playing tennis. States in March of 2024 he had a fall on a sidewalk and fractured his patella, requiring ORIF. States he was receiving PT, everything going well overall until around August, when follow up imaging showed non-union of fracture. Pt states he continued to improve with PT and fracture healing was monitored with follow up imaging, most recent a couple weeks ago. He endorses that imaging has not been decisive re: fracture healing and he may end up having another surgery. He states his surgeon would like for him to return to PT to maximize quad strength and improve balance (ran out of insurance visits last year). He states he doesn't really have much pain overall, primary issue is a "hitch" or "catch" at times, will also have swelling if he overdoes it. States with prior bout of PT he was working on balance, resistance bands, closed chain movements, bike, and mobility work. He denies any formal restrictions at this point.   SUBJECTIVE STATEMENT: 11/10/2023 continues to have less swelling overall. Felt good after last session and no extra swelling. Has been doing well with HEP.     PERTINENT HISTORY: HTN, DM, patellar  fracture L knee March 2024 w/ ORIF  PAIN:  Are you having pain: no pain at present  Per eval -  Location/description: R knee Best-worst over past week: 0-5/10  - aggravating factors: leading w L leg up stairs, STS and first few steps, uneven surfaces, walking down decline - Easing factors: rest, medication  PRECAUTIONS: pt denies any specific restrictions Per paper referral special instructions: "balance/gait, quadriceps strengthening, knee stabilization exercises LLE"  WEIGHT BEARING RESTRICTIONS: No  FALLS:  Has patient fallen in last 6 months?  No  LIVING ENVIRONMENT: Has a roommate/partner ; 1STE, level home  OCCUPATION: was an Catering manager for [employer], currently looking for work with history consulting  PLOF: Independent  PATIENT GOALS: get back to playing tennis, walking more. Enjoys being active  NEXT MD VISIT: end of March  OBJECTIVE:  Note: Objective measures were completed at Evaluation unless otherwise noted.  DIAGNOSTIC FINDINGS:  No recent imaging in chart - pt states he has had follow up imaging since surgery, most recent a couple of weeks ago. States he will bring in radiology impression L patellar ORIF 12/02/22   PATIENT SURVEYS:  FOTO deferred at eval - not yet set up in system  COGNITION: Overall cognitive status: Within functional limits for tasks assessed     SENSATION: Does not endorse any sensory complaints  EDEMA:  Endorses fluctuating edema about knee - not significant on observation today   LOWER EXTREMITY ROM:     Active  Right eval Left eval  Hip flexion    Hip extension    Hip internal rotation    Hip external rotation    Knee extension full Full   Knee flexion  129 painless   (Blank rows = not tested) (Key: WFL = within functional limits not formally assessed, * = concordant pain, s = stiffness/stretching sensation, NT = not tested)  Comments:    LOWER EXTREMITY MMT:    MMT Right eval Left eval  Hip flexion    Hip abduction (modified sitting)    Hip internal rotation    Hip external rotation    Knee flexion 5 3+ (mild pinch, no overt pain)  Knee extension 5 5  Ankle dorsiflexion     (Blank rows = not tested) (Key: WFL = within functional limits not formally assessed, * = concordant pain, s = stiffness/stretching sensation, NT = not tested)  Comments: with L knee ext MMT - minimal PT resistance applied, gradual build  LOWER EXTREMITY SPECIAL TESTS:  SLR without lag or pain, 10 reps equal BIL  FUNCTIONAL TESTS:  5xSTS: 16.26sec no UE support, mild popping but no  pain  GAIT: Distance walked: within clinic Assistive device utilized: None Level of assistance: Complete Independence Comments: mildly reduced stance time LLE, occasional hyperextension L knee in stance   FGA on eval:  -Item 1 Gait Level Surface: mild impairment 2  -Item 2 Change in Gait Speed: mild impairment 2  -Item 3 Gait with Horizontal Head Turns: mild impairment 2  -Item 4 Gait with Vertical Head Turns: Normal 3  -Item 5 Gait with Pivot Turn: Normal 3  -Item 6 Step Over Obstacle: mild impairment 2  -Item 7 Gait with Narrow Base of Support: Normal 3  -Item 8 Gait with Eyes Closed: mild impairment 2             -Item 9 Ambulating Backwards: mild impairment 2  -Item 10 Steps: moderate impairment 1  (pt self report) Total: 22 /30  * Score of <=  22/30 indicates that patient is at increased risk for falls.                                                                                                                                 TREATMENT DATE:  [facility] Adult PT Treatment:                                                DATE: 11/10/23 Therapeutic Exercise: Unresisted LAQ 2x10 cues for pacing/comfortable ROM (modified to SAQ 90-60 approximately) Mini squat at staircase x10 cues for comfortable ROM HEP update + education/handout, rationale for interventions and relevant anatomy/physiology   Neuromuscular re-ed: Propped quad set 2x12  Retro walking 3 laps emphasis on quad activation Lateral walking w/ green band superior to knees for improved hip activation 3 laps  Self Care: Education/discussion re: symptom behavior as it affects activity outside of sessions, monitoring for appropriate response and modification as needed    [facility] Adult PT Treatment:                                                DATE: 11/03/23 Therapeutic Exercise: SLR 2x15 cues for quad contraction, for quad endurance STS from raised mat (hips just above knees) 2x8 HEP update + education, increased time w/  education/discussion re: modification, home setup, relevant anatomy/physiology   Neuromuscular re-ed: Standing TKE into ball at wall for quad activation, 2x10 LLE cues for weight shifting, tactile cues at quad Seated bosu ball TKE push x12 Retro walking 5 laps at counter     [facility] Adult PT Treatment:                                                DATE: 10/27/23 Therapeutic Exercise: SLR 2x10 STS raised surface (chair + 3 pads) 3x5 cues for pacing, positioning Retrowalking 4x10ft with cues for quad extension, pacing, posture, and appropriate step length Mini lunge LLE fwd x15 w/ UE support  Mini squat at counter w/ UE support x8 cues for comfortable ROM, BOS, and mechanics HEP update + education/handout Increased time w/ education/discussion re: relevant anatomy/physiology, rationale for interventions, monitoring response to exercises and appropriate modifications as needed  [facility] Adult PT Treatment:                                                DATE: 10/20/23 Therapeutic Exercise: SLR, heel raises, Quarry manager  reps, HEP handout + education  Therapeutic Activity: Significant time w/ discussion/education re: impairments, 5xSTS and FGA as they relate to functional mobility, strategies to improve tolerance, principles of gradual progression with respect to symptom behavior/tolerance    PATIENT EDUCATION:  Education details: rationale for interventions, HEP  Person educated: Patient Education method: Explanation, Demonstration, Tactile cues, Verbal cues Education comprehension: verbalized understanding, returned demonstration, verbal cues required, tactile cues required, and needs further education     HOME EXERCISE PROGRAM: Access Code: [email or web address] URL: [email or web address] Date: 11/10/2023 Prepared by: Fransisco Hertz  Exercises - Active Straight Leg Raise with Quad Set  - 2-3 x daily - 1 sets - 10 reps - Heel Raises with Counter Support  - 2-3 x daily - 1 sets - 10  reps - Standing Terminal Knee Extension at Wall with Ball  - 2-3 x daily - 1 sets - 10 reps - Sit to Stand with Armchair  - 2-3 x daily - 1 sets - 5-8 reps - Side Stepping with Resistance at Thighs and Counter Support  - 2-3 x daily - 1 sets - 10 reps  ASSESSMENT:  CLINICAL IMPRESSION: 11/10/2023 Pt arrives w/o pain, no issues after last session. Today continuing to focus on quad activation/strength. Attempt to progress to unresisted LAQ although pt has increasing pain/pinching in lateral knee, subsequently modified to SAQ (~60-90deg arc) within pain free ROM. Otherwise tolerates session well, no increase in resting pain but does endorse some fatigue/tension in lateral quad at end of session. No adverse events. Recommend continuing along current POC in order to address relevant deficits and improve functional tolerance. Pt departs today's session in no acute distress, all voiced questions/concerns addressed appropriately from PT perspective.     Per eval - Patient is a pleasant 63 y.o. gentleman who was seen today for physical therapy evaluation and treatment for balance/gait in context of L patellar fracture w/ ORIF March 2024. Pt denies formal restrictions at this point but states he may require additional surgery for non-union, goal of PT is to maximize quad strength and functional mobility. He states at present he notes the most difficulty with stair navigation, incline/declines, uneven surfaces, and sit<>stand after prolonged sitting. Doesn't have much pain but does have occasional swelling and popping. On exam he demonstrates significant reduction in L quad strength although does have good ROM. 5xSTS is within fall risk category (12-14sec cutoff score). FGA score is at cutoff for fall risk (22/30), demonstrates mild instability with dynamic tasks but no overt LOB. At present recommend skilled PT to address relevant impairments with focus on quad stability and functional mobility. Pt tolerates  exam/HEP well without adverse event or increase in resting pain. Pt departs today's session in no acute distress, all voiced questions/concerns addressed appropriately from PT perspective.    OBJECTIVE IMPAIRMENTS: Abnormal gait, decreased activity tolerance, decreased balance, decreased endurance, difficulty walking, decreased strength, improper body mechanics, and pain.   ACTIVITY LIMITATIONS: carrying, lifting, squatting, stairs, transfers, and locomotion level  PARTICIPATION LIMITATIONS: meal prep, cleaning, laundry, and community activity  PERSONAL FACTORS: Age, Time since onset of injury/illness/exacerbation, and 3+ comorbidities: HTN, DM, patellar fracture L knee  are also affecting patient's functional outcome.   REHAB POTENTIAL: Fair given chronicity, surgical history, and comorbidities  CLINICAL DECISION MAKING: Stable/uncomplicated  EVALUATION COMPLEXITY: Low   GOALS:  SHORT TERM GOALS: Target date: 11/17/2023 Pt will demonstrate appropriate understanding and performance of initially prescribed HEP in order to facilitate improved independence with management of symptoms.  Baseline: HEP provided  on eval Goal status: INITIAL   2. Pt will be able to perform 5xSTS in </= 14sec in order to facilitate reduced fall risk and improved functional mobility.  Baseline: 16sec  Goal status: INITIAL  LONG TERM GOALS: Target date: 12/15/2023 Pt will meet predicted score or greater on FOTO in order to demonstrate improved perception of function due to symptoms.  Baseline: FOTO TBD Goal status: INITIAL  2.  Pt will report ability to navigate up to 1 flight of stairs w/ reciprocal pattern and less than 2pt increase in resting pain in order to facilitate improved community access. Baseline: reports reciprocal pattern and rail use Goal status: INITIAL  3.  Pt will score greater than or equal to 26/30 on Functional Gait assessment in order to indicate reduced fall risk (cutoff score </= 22/30  predictive of falls per Alvino Chapel et al 2010, MCID 4 pts Beninato et al 2014)  Baseline: 22/30  Goal status: INITIAL   4.  Pt will be able to perform 5xSTS in less than or equal to 12 in order to demonstrate reduced fall risk and improved functional independence (MCID 5xSTS = 2.3 sec). Baseline: 16sec Goal status: INITIAL    PLAN:  PT FREQUENCY: 1x/week  PT DURATION: 8 weeks  PLANNED INTERVENTIONS: 97164- PT Re-evaluation, 97110-Therapeutic exercises, 97530- Therapeutic activity, [id number]- Neuromuscular re-education, 97535- Self Care, 16109- Manual therapy, [phone]- Gait training, [phone]- Aquatic Therapy, Patient/Family education, Balance training, Stair training, Taping, Dry Needling, Joint mobilization, Scar mobilization, Cryotherapy, and Moist heat  PLAN FOR NEXT SESSION: Review/update HEP PRN. Working on quad stability/strength, balance. Symptom modification strategies as indicated/appropriate. Mindful of ORIF/fracture, recommend close monitoring of symptom response and swelling with activities, modifications as needed.    Ashley Murrain PT, DPT 11/10/2023 9:33 AM

## 2023-11-10 ENCOUNTER — Encounter: Payer: Self-pay | Admitting: Physical Therapy

## 2023-11-10 ENCOUNTER — Ambulatory Visit: Payer: 59 | Admitting: Physical Therapy

## 2023-11-10 DIAGNOSIS — R2689 Other abnormalities of gait and mobility: Secondary | ICD-10-CM | POA: Diagnosis not present

## 2023-11-10 DIAGNOSIS — M25562 Pain in left knee: Secondary | ICD-10-CM | POA: Diagnosis not present

## 2023-11-12 ENCOUNTER — Encounter: Payer: 59 | Admitting: Family Medicine

## 2023-11-16 NOTE — Therapy (Signed)
 OUTPATIENT PHYSICAL THERAPY TREATMENT   Patient Name: James Ware MRN: [id number] DOB:1961-08-03, 63 y.o., male Today's Date: 11/17/2023  END OF SESSION:  PT End of Session - 11/17/23 0847     Visit Number 5    Number of Visits 9    Date for PT Re-Evaluation 12/15/23    Authorization Type aetna    Authorization Time Period 30VL no auth required    Authorization - Visit Number 5    Authorization - Number of Visits 30    PT Start Time 0847    PT Stop Time 0929    PT Time Calculation (min) 42 min    Activity Tolerance Patient tolerated treatment well                 Past Medical History:  Diagnosis Date   Arthritis    left knee   Eczema 05/24/2017   Essential hypertension 05/24/2017   Hyperlipidemia    Prediabetes 05/24/2017   Venous stasis dermatitis of both lower extremities 05/24/2017   Past Surgical History:  Procedure Laterality Date   COLONOSCOPY     TONSILLECTOMY     in first grade   TYMPANOSTOMY TUBE PLACEMENT     as a child   WISDOM TOOTH EXTRACTION     Patient Active Problem List   Diagnosis Date Noted   Closed patellar sleeve fracture of left knee 11/30/2022   Primary osteoarthritis of left hip 12/05/2021   Well adult exam 08/17/2021   Actinic skin damage - scalp and forehead, cryotherapy applied 02/13/21 02/13/2021   Seasonal allergic rhinitis 05/22/2019   Erectile dysfunction 04/11/2019   Hearing loss 12/16/2017   HLD (hyperlipidemia) 05/27/2017   Hypertension associated with diabetes (HCC) 05/24/2017   Eczema 05/24/2017   Venous stasis dermatitis of both lower extremities 05/24/2017   Type 2 diabetes mellitus with other specified complication (HCC) 05/24/2017    PCP: Everrett Coombe, DO  REFERRING PROVIDER: Vale Haven, MD  REFERRING DIAG: Displaced comminuted fracture of left patella, initial encounter for closed fracture (A21.308M) ;    THERAPY DIAG:  Left knee pain, unspecified chronicity  Other abnormalities of gait  and mobility  Rationale for Evaluation and Treatment: Rehabilitation  ONSET DATE: March 2024  SUBJECTIVE:   Per eval - Pt describes himself as historically quite active - enjoys jogging, golfing, playing tennis. States in March of 2024 he had a fall on a sidewalk and fractured his patella, requiring ORIF. States he was receiving PT, everything going well overall until around August, when follow up imaging showed non-union of fracture. Pt states he continued to improve with PT and fracture healing was monitored with follow up imaging, most recent a couple weeks ago. He endorses that imaging has not been decisive re: fracture healing and he may end up having another surgery. He states his surgeon would like for him to return to PT to maximize quad strength and improve balance (ran out of insurance visits last year). He states he doesn't really have much pain overall, primary issue is a "hitch" or "catch" at times, will also have swelling if he overdoes it. States with prior bout of PT he was working on balance, resistance bands, closed chain movements, bike, and mobility work. He denies any formal restrictions at this point.   SUBJECTIVE STATEMENT: 11/17/2023 Has noticed some pain in lateral L knee if he goes too far on LAQ. Swelling has still been better, no pain other than transiently during LAQ as above. HEP going well  PERTINENT HISTORY: HTN, DM, patellar fracture L knee March 2024 w/ ORIF  PAIN:  Are you having pain: no pain at present  Per eval -  Location/description: R knee Best-worst over past week: 0-5/10  - aggravating factors: leading w L leg up stairs, STS and first few steps, uneven surfaces, walking down decline - Easing factors: rest, medication  PRECAUTIONS: pt denies any specific restrictions Per paper referral special instructions: "balance/gait, quadriceps strengthening, knee stabilization exercises LLE"  WEIGHT BEARING RESTRICTIONS: No  FALLS:  Has patient fallen  in last 6 months? No  LIVING ENVIRONMENT: Has a roommate/partner ; 1STE, level home  OCCUPATION: was an Catering manager for [employer], currently looking for work with history consulting  PLOF: Independent  PATIENT GOALS: get back to playing tennis, walking more. Enjoys being active  NEXT MD VISIT: end of March  OBJECTIVE:  Note: Objective measures were completed at Evaluation unless otherwise noted.  DIAGNOSTIC FINDINGS:  No recent imaging in chart - pt states he has had follow up imaging since surgery, most recent a couple of weeks ago. States he will bring in radiology impression L patellar ORIF 12/02/22   PATIENT SURVEYS:  FOTO deferred at eval - not yet set up in system  COGNITION: Overall cognitive status: Within functional limits for tasks assessed     SENSATION: Does not endorse any sensory complaints  EDEMA:  Endorses fluctuating edema about knee - not significant on observation today   LOWER EXTREMITY ROM:     Active  Right eval Left eval  Hip flexion    Hip extension    Hip internal rotation    Hip external rotation    Knee extension full Full   Knee flexion  129 painless   (Blank rows = not tested) (Key: WFL = within functional limits not formally assessed, * = concordant pain, s = stiffness/stretching sensation, NT = not tested)  Comments:    LOWER EXTREMITY MMT:    MMT Right eval Left eval  Hip flexion    Hip abduction (modified sitting)    Hip internal rotation    Hip external rotation    Knee flexion 5 3+ (mild pinch, no overt pain)  Knee extension 5 5  Ankle dorsiflexion     (Blank rows = not tested) (Key: WFL = within functional limits not formally assessed, * = concordant pain, s = stiffness/stretching sensation, NT = not tested)  Comments: with L knee ext MMT - minimal PT resistance applied, gradual build  LOWER EXTREMITY SPECIAL TESTS:  SLR without lag or pain, 10 reps equal BIL  FUNCTIONAL TESTS:  5xSTS: 16.26sec no UE support, mild  popping but no pain  GAIT: Distance walked: within clinic Assistive device utilized: None Level of assistance: Complete Independence Comments: mildly reduced stance time LLE, occasional hyperextension L knee in stance   FGA on eval:  -Item 1 Gait Level Surface: mild impairment 2  -Item 2 Change in Gait Speed: mild impairment 2  -Item 3 Gait with Horizontal Head Turns: mild impairment 2  -Item 4 Gait with Vertical Head Turns: Normal 3  -Item 5 Gait with Pivot Turn: Normal 3  -Item 6 Step Over Obstacle: mild impairment 2  -Item 7 Gait with Narrow Base of Support: Normal 3  -Item 8 Gait with Eyes Closed: mild impairment 2             -Item 9 Ambulating Backwards: mild impairment 2  -Item 10 Steps: moderate impairment 1  (pt self report) Total: 22 /  30  * Score of <=22/30 indicates that patient is at increased risk for falls.                                                                                                                                 TREATMENT DATE:  [facility] Adult PT Treatment:                                                DATE: 11/17/23 Therapeutic Exercise: Unresisted LAQ 2x12 within comfortable ROM (90-50, checked w/ goniometer) Supine SAQ w/ towel under knee, ~15 deg 2x8 SLR x12 LLE, x8 w/ toes in  Mini squat w/ UE support 2x8 within comfortable ROM HEP handout + education, relevant anatomy/physiology and rationale for interventions  Neuromuscular re-ed: Toe walking along counter 3 laps weaning UE support CGA initially > supervision Toe raises 2x15 emphasis on posture and quad activation    [facility] Adult PT Treatment:                                                DATE: 11/10/23 Therapeutic Exercise: Unresisted LAQ 2x10 cues for pacing/comfortable ROM (modified to SAQ 90-60 approximately) Mini squat at staircase x10 cues for comfortable ROM HEP update + education/handout, rationale for interventions and relevant anatomy/physiology   Neuromuscular  re-ed: Propped quad set 2x12  Retro walking 3 laps emphasis on quad activation Lateral walking w/ green band superior to knees for improved hip activation 3 laps  Self Care: Education/discussion re: symptom behavior as it affects activity outside of sessions, monitoring for appropriate response and modification as needed    [facility] Adult PT Treatment:                                                DATE: 11/03/23 Therapeutic Exercise: SLR 2x15 cues for quad contraction, for quad endurance STS from raised mat (hips just above knees) 2x8 HEP update + education, increased time w/ education/discussion re: modification, home setup, relevant anatomy/physiology   Neuromuscular re-ed: Standing TKE into ball at wall for quad activation, 2x10 LLE cues for weight shifting, tactile cues at quad Seated bosu ball TKE push x12 Retro walking 5 laps at counter     [facility] Adult PT Treatment:                                                DATE: 10/27/23 Therapeutic Exercise: SLR  2x10 STS raised surface (chair + 3 pads) 3x5 cues for pacing, positioning Retrowalking 4x55ft with cues for quad extension, pacing, posture, and appropriate step length Mini lunge LLE fwd x15 w/ UE support  Mini squat at counter w/ UE support x8 cues for comfortable ROM, BOS, and mechanics HEP update + education/handout Increased time w/ education/discussion re: relevant anatomy/physiology, rationale for interventions, monitoring response to exercises and appropriate modifications as needed  [facility] Adult PT Treatment:                                                DATE: 10/20/23 Therapeutic Exercise: SLR, heel raises, Quarry manager reps, HEP handout + education  Therapeutic Activity: Significant time w/ discussion/education re: impairments, 5xSTS and FGA as they relate to functional mobility, strategies to improve tolerance, principles of gradual progression with respect to symptom behavior/tolerance    PATIENT EDUCATION:   Education details: rationale for interventions, HEP  Person educated: Patient Education method: Explanation, Demonstration, Tactile cues, Verbal cues Education comprehension: verbalized understanding, returned demonstration, verbal cues required, tactile cues required, and needs further education     HOME EXERCISE PROGRAM: Access Code: [email or web address] URL: [email or web address] Date: 11/17/2023 Prepared by: Fransisco Hertz  Exercises - Active Straight Leg Raise with Quad Set  - 2-3 x daily - 1 sets - 12 reps - Standing Terminal Knee Extension at Wall with Ball  - 2-3 x daily - 1 sets - 10 reps - Side Stepping with Resistance at Thighs and Counter Support  - 2-3 x daily - 1 sets - 10 reps - Heel Toe Raises with Counter Support  - 2-3 x daily - 1 sets - 8 reps - Mini Squat with Counter Support  - 2-3 x daily - 1 sets - 5-8 reps - Supine Short Arc Quad  - 2-3 x daily - 1 sets - 6-8 reps  ASSESSMENT:  CLINICAL IMPRESSION: 11/17/2023 Pt arrives wo pain - does have pain with LAQ if he goes too far but otherwise doing well, no issues after last session. Able to build volume with this movement today in comfortable ROM, emphasis on monitoring symptoms and maintaining appropriate tolerance. Also able to add SAQ  with good tolerance. Remainder of session working on building quad endurance in open/closed chain and improving postural stability. No adverse events or increase in resting pain, tolerates well overall. Recommend continuing along current POC in order to address relevant deficits and improve functional tolerance. Pt departs today's session in no acute distress, all voiced questions/concerns addressed appropriately from PT perspective.      Per eval - Patient is a pleasant 63 y.o. gentleman who was seen today for physical therapy evaluation and treatment for balance/gait in context of L patellar fracture w/ ORIF March 2024. Pt denies formal restrictions at this point but states he may  require additional surgery for non-union, goal of PT is to maximize quad strength and functional mobility. He states at present he notes the most difficulty with stair navigation, incline/declines, uneven surfaces, and sit<>stand after prolonged sitting. Doesn't have much pain but does have occasional swelling and popping. On exam he demonstrates significant reduction in L quad strength although does have good ROM. 5xSTS is within fall risk category (12-14sec cutoff score). FGA score is at cutoff for fall risk (22/30), demonstrates mild instability with dynamic tasks but no overt LOB. At present recommend skilled  PT to address relevant impairments with focus on quad stability and functional mobility. Pt tolerates exam/HEP well without adverse event or increase in resting pain. Pt departs today's session in no acute distress, all voiced questions/concerns addressed appropriately from PT perspective.    OBJECTIVE IMPAIRMENTS: Abnormal gait, decreased activity tolerance, decreased balance, decreased endurance, difficulty walking, decreased strength, improper body mechanics, and pain.   ACTIVITY LIMITATIONS: carrying, lifting, squatting, stairs, transfers, and locomotion level  PARTICIPATION LIMITATIONS: meal prep, cleaning, laundry, and community activity  PERSONAL FACTORS: Age, Time since onset of injury/illness/exacerbation, and 3+ comorbidities: HTN, DM, patellar fracture L knee  are also affecting patient's functional outcome.   REHAB POTENTIAL: Fair given chronicity, surgical history, and comorbidities  CLINICAL DECISION MAKING: Stable/uncomplicated  EVALUATION COMPLEXITY: Low   GOALS:  SHORT TERM GOALS: Target date: 11/17/2023 Pt will demonstrate appropriate understanding and performance of initially prescribed HEP in order to facilitate improved independence with management of symptoms.  Baseline: HEP provided on eval 11/17/23: reports good HEP adherence Goal status: MET   2. Pt will be  able to perform 5xSTS in </= 14sec in order to facilitate reduced fall risk and improved functional mobility.  Baseline: 16sec Goal status: ONGOING   LONG TERM GOALS: Target date: 12/15/2023 Pt will meet predicted score or greater on FOTO in order to demonstrate improved perception of function due to symptoms.  Baseline: FOTO TBD Goal status: INITIAL  2.  Pt will report ability to navigate up to 1 flight of stairs w/ reciprocal pattern and less than 2pt increase in resting pain in order to facilitate improved community access. Baseline: reports reciprocal pattern and rail use Goal status: INITIAL  3.  Pt will score greater than or equal to 26/30 on Functional Gait assessment in order to indicate reduced fall risk (cutoff score </= 22/30 predictive of falls per Alvino Chapel et al 2010, MCID 4 pts Beninato et al 2014)  Baseline: 22/30  Goal status: INITIAL   4.  Pt will be able to perform 5xSTS in less than or equal to 12 in order to demonstrate reduced fall risk and improved functional independence (MCID 5xSTS = 2.3 sec). Baseline: 16sec Goal status: INITIAL    PLAN:  PT FREQUENCY: 1x/week  PT DURATION: 8 weeks  PLANNED INTERVENTIONS: 97164- PT Re-evaluation, 97110-Therapeutic exercises, 97530- Therapeutic activity, [id number]- Neuromuscular re-education, 97535- Self Care, [id number]- Manual therapy, [phone]- Gait training, [phone]- Aquatic Therapy, Patient/Family education, Balance training, Stair training, Taping, Dry Needling, Joint mobilization, Scar mobilization, Cryotherapy, and Moist heat  PLAN FOR NEXT SESSION: Review/update HEP PRN. Working on quad stability/strength, balance. Symptom modification strategies as indicated/appropriate. Mindful of ORIF/fracture, recommend close monitoring of symptom response and swelling with activities, modifications as needed.    Ashley Murrain PT, DPT 11/17/2023 9:31 AM

## 2023-11-17 ENCOUNTER — Ambulatory Visit: Payer: 59 | Admitting: Physical Therapy

## 2023-11-17 ENCOUNTER — Encounter: Payer: Self-pay | Admitting: Physical Therapy

## 2023-11-17 DIAGNOSIS — M25562 Pain in left knee: Secondary | ICD-10-CM | POA: Diagnosis not present

## 2023-11-17 DIAGNOSIS — R2689 Other abnormalities of gait and mobility: Secondary | ICD-10-CM

## 2023-11-23 ENCOUNTER — Telehealth: Payer: Self-pay | Admitting: Family Medicine

## 2023-11-23 DIAGNOSIS — E1169 Type 2 diabetes mellitus with other specified complication: Secondary | ICD-10-CM

## 2023-11-23 DIAGNOSIS — E785 Hyperlipidemia, unspecified: Secondary | ICD-10-CM

## 2023-11-23 DIAGNOSIS — Z0000 Encounter for general adult medical examination without abnormal findings: Secondary | ICD-10-CM

## 2023-11-23 NOTE — Therapy (Signed)
 OUTPATIENT PHYSICAL THERAPY TREATMENT   Patient Name: James Ware MRN: [id number] DOB:Jun 09, 1961, 63 y.o., male Today's Date: 11/24/2023  END OF SESSION:  PT End of Session - 11/24/23 1020     Visit Number 6    Number of Visits 9    Date for PT Re-Evaluation 12/15/23    Authorization Type aetna    Authorization Time Period 30VL no auth required    Authorization - Visit Number 6    Authorization - Number of Visits 30    PT Start Time 1020    PT Stop Time 1102    PT Time Calculation (min) 42 min    Activity Tolerance Patient tolerated treatment well                  Past Medical History:  Diagnosis Date   Arthritis    left knee   Eczema 05/24/2017   Essential hypertension 05/24/2017   Hyperlipidemia    Prediabetes 05/24/2017   Venous stasis dermatitis of both lower extremities 05/24/2017   Past Surgical History:  Procedure Laterality Date   COLONOSCOPY     TONSILLECTOMY     in first grade   TYMPANOSTOMY TUBE PLACEMENT     as a child   WISDOM TOOTH EXTRACTION     Patient Active Problem List   Diagnosis Date Noted   Closed patellar sleeve fracture of left knee 11/30/2022   Primary osteoarthritis of left hip 12/05/2021   Well adult exam 08/17/2021   Actinic skin damage - scalp and forehead, cryotherapy applied 02/13/21 02/13/2021   Seasonal allergic rhinitis 05/22/2019   Erectile dysfunction 04/11/2019   Hearing loss 12/16/2017   HLD (hyperlipidemia) 05/27/2017   Hypertension associated with diabetes (HCC) 05/24/2017   Eczema 05/24/2017   Venous stasis dermatitis of both lower extremities 05/24/2017   Type 2 diabetes mellitus with other specified complication (HCC) 05/24/2017    PCP: Everrett Coombe, DO  REFERRING PROVIDER: Vale Haven, MD  REFERRING DIAG: Displaced comminuted fracture of left patella, initial encounter for closed fracture (E95.284X) ;    THERAPY DIAG:  Left knee pain, unspecified chronicity  Other abnormalities of gait  and mobility  Rationale for Evaluation and Treatment: Rehabilitation  ONSET DATE: March 2024  SUBJECTIVE:   Per eval - Pt describes himself as historically quite active - enjoys jogging, golfing, playing tennis. States in March of 2024 he had a fall on a sidewalk and fractured his patella, requiring ORIF. States he was receiving PT, everything going well overall until around August, when follow up imaging showed non-union of fracture. Pt states he continued to improve with PT and fracture healing was monitored with follow up imaging, most recent a couple weeks ago. He endorses that imaging has not been decisive re: fracture healing and he may end up having another surgery. He states his surgeon would like for him to return to PT to maximize quad strength and improve balance (ran out of insurance visits last year). He states he doesn't really have much pain overall, primary issue is a "hitch" or "catch" at times, will also have swelling if he overdoes it. States with prior bout of PT he was working on balance, resistance bands, closed chain movements, bike, and mobility work. He denies any formal restrictions at this point.   SUBJECTIVE STATEMENT: 11/24/2023 Had some pain Sunday and Monday which he attributes to more sitting/driving over the weekend. Also had a bit more swelling with the sitting. Felt better after some medication and more movement  since. No extra pain with exercises. No pain at present.     PERTINENT HISTORY: HTN, DM, patellar fracture L knee March 2024 w/ ORIF  PAIN:  Are you having pain: no pain at present   Per eval -  Location/description: R knee Best-worst over past week: 0-5/10  - aggravating factors: leading w L leg up stairs, STS and first few steps, uneven surfaces, walking down decline - Easing factors: rest, medication  PRECAUTIONS: pt denies any specific restrictions Per paper referral special instructions: "balance/gait, quadriceps strengthening, knee  stabilization exercises LLE"  WEIGHT BEARING RESTRICTIONS: No  FALLS:  Has patient fallen in last 6 months? No  LIVING ENVIRONMENT: Has a roommate/partner ; 1STE, level home  OCCUPATION: was an Catering manager for [employer], currently looking for work with history consulting  PLOF: Independent  PATIENT GOALS: get back to playing tennis, walking more. Enjoys being active  NEXT MD VISIT: end of March  OBJECTIVE:  Note: Objective measures were completed at Evaluation unless otherwise noted.  DIAGNOSTIC FINDINGS:  No recent imaging in chart - pt states he has had follow up imaging since surgery, most recent a couple of weeks ago. States he will bring in radiology impression L patellar ORIF 12/02/22   PATIENT SURVEYS:  FOTO deferred at eval - not yet set up in system  COGNITION: Overall cognitive status: Within functional limits for tasks assessed     SENSATION: Does not endorse any sensory complaints  EDEMA:  Endorses fluctuating edema about knee - not significant on observation today   LOWER EXTREMITY ROM:     Active  Right eval Left eval  Hip flexion    Hip extension    Hip internal rotation    Hip external rotation    Knee extension full Full   Knee flexion  129 painless   (Blank rows = not tested) (Key: WFL = within functional limits not formally assessed, * = concordant pain, s = stiffness/stretching sensation, NT = not tested)  Comments:    LOWER EXTREMITY MMT:    MMT Right eval Left eval  Hip flexion    Hip abduction (modified sitting)    Hip internal rotation    Hip external rotation    Knee flexion 5 3+ (mild pinch, no overt pain)  Knee extension 5 5  Ankle dorsiflexion     (Blank rows = not tested) (Key: WFL = within functional limits not formally assessed, * = concordant pain, s = stiffness/stretching sensation, NT = not tested)  Comments: with L knee ext MMT - minimal PT resistance applied, gradual build  LOWER EXTREMITY SPECIAL TESTS:  SLR  without lag or pain, 10 reps equal BIL  FUNCTIONAL TESTS:  5xSTS: 16.26sec no UE support, mild popping but no pain  GAIT: Distance walked: within clinic Assistive device utilized: None Level of assistance: Complete Independence Comments: mildly reduced stance time LLE, occasional hyperextension L knee in stance   FGA on eval:  -Item 1 Gait Level Surface: mild impairment 2  -Item 2 Change in Gait Speed: mild impairment 2  -Item 3 Gait with Horizontal Head Turns: mild impairment 2  -Item 4 Gait with Vertical Head Turns: Normal 3  -Item 5 Gait with Pivot Turn: Normal 3  -Item 6 Step Over Obstacle: mild impairment 2  -Item 7 Gait with Narrow Base of Support: Normal 3  -Item 8 Gait with Eyes Closed: mild impairment 2             -Item 9 Ambulating Backwards: mild  impairment 2  -Item 10 Steps: moderate impairment 1  (pt self report) Total: 22 /30  * Score of <=22/30 indicates that patient is at increased risk for falls.                                                                                                                                 TREATMENT DATE:  [facility] Adult PT Treatment:                                                DATE: 11/24/23 Therapeutic Exercise: Unresisted LAQ 3x10 within comfortable ROM (~50deg) Knee extension isometric (~60deg) 2x5 w/ 5sec hold, submax and gradual build within tolerance LLE Mini squat x10 emphasis on comfortable ROM and symmetrical WB  HEP discussion/education   Neuromuscular re-ed: Fwd step up x15 w/ LLE onto airex, cues for trunk positioning and quad control Airex lateral step up x15 LLE cues for foot positioning, quad control, posture With both versions of step ups, frequent rest and time taken throughout to educate/cue on different mechanics and checking in on symptoms  Self Care: Continued education/discussion re: monitoring symptoms, appropriate activity modification, self progression/regression of activity based on symptom  response   [facility] Adult PT Treatment:                                                DATE: 11/17/23 Therapeutic Exercise: Unresisted LAQ 2x12 within comfortable ROM (90-50, checked w/ goniometer) Supine SAQ w/ towel under knee, ~15 deg 2x8 SLR x12 LLE, x8 w/ toes in  Mini squat w/ UE support 2x8 within comfortable ROM HEP handout + education, relevant anatomy/physiology and rationale for interventions  Neuromuscular re-ed: Toe walking along counter 3 laps weaning UE support CGA initially > supervision Toe raises 2x15 emphasis on posture and quad activation    [facility] Adult PT Treatment:                                                DATE: 11/10/23 Therapeutic Exercise: Unresisted LAQ 2x10 cues for pacing/comfortable ROM (modified to SAQ 90-60 approximately) Mini squat at staircase x10 cues for comfortable ROM HEP update + education/handout, rationale for interventions and relevant anatomy/physiology   Neuromuscular re-ed: Propped quad set 2x12  Retro walking 3 laps emphasis on quad activation Lateral walking w/ green band superior to knees for improved hip activation 3 laps  Self Care: Education/discussion re: symptom behavior as it affects activity outside of sessions, monitoring for appropriate response and modification as needed    PATIENT EDUCATION:  Education details: rationale for interventions, HEP  Person educated: Patient Education method: Explanation, Demonstration, Tactile cues, Verbal cues Education comprehension: verbalized understanding, returned demonstration, verbal cues required, tactile cues required, and needs further education     HOME EXERCISE PROGRAM: Access Code: [email or web address] URL: [email or web address] Date: 11/17/2023 Prepared by: Fransisco Hertz  Exercises - Active Straight Leg Raise with Quad Set  - 2-3 x daily - 1 sets - 12 reps - Standing Terminal Knee Extension at Wall with Ball  - 2-3 x daily - 1 sets - 10 reps - Side Stepping with  Resistance at Thighs and Counter Support  - 2-3 x daily - 1 sets - 10 reps - Heel Toe Raises with Counter Support  - 2-3 x daily - 1 sets - 8 reps - Mini Squat with Counter Support  - 2-3 x daily - 1 sets - 5-8 reps - Supine Short Arc Quad  - 2-3 x daily - 1 sets - 6-8 reps  ASSESSMENT:  CLINICAL IMPRESSION: 11/24/2023 Pt arrives w/o pain at present, does endorse increased pain over weekend with increased sitting but overall continuing to do well. Today continues with similar tolerance to limited ROM LAQ with increase in volume. We do introduce submax quad isometrics within comfortable ROM - does require cues for appropriate setup and and force output, some transient discomfort but improves with repetition. Also working on quad stability w/ airex exercises. Tolerates well overall, no adverse events and no increase in resting pain. Recommend continuing along current POC in order to address relevant deficits and improve functional tolerance. Pt departs today's session in no acute distress, all voiced questions/concerns addressed appropriately from PT perspective.    Per eval - Patient is a pleasant 63 y.o. gentleman who was seen today for physical therapy evaluation and treatment for balance/gait in context of L patellar fracture w/ ORIF March 2024. Pt denies formal restrictions at this point but states he may require additional surgery for non-union, goal of PT is to maximize quad strength and functional mobility. He states at present he notes the most difficulty with stair navigation, incline/declines, uneven surfaces, and sit<>stand after prolonged sitting. Doesn't have much pain but does have occasional swelling and popping. On exam he demonstrates significant reduction in L quad strength although does have good ROM. 5xSTS is within fall risk category (12-14sec cutoff score). FGA score is at cutoff for fall risk (22/30), demonstrates mild instability with dynamic tasks but no overt LOB. At present  recommend skilled PT to address relevant impairments with focus on quad stability and functional mobility. Pt tolerates exam/HEP well without adverse event or increase in resting pain. Pt departs today's session in no acute distress, all voiced questions/concerns addressed appropriately from PT perspective.    OBJECTIVE IMPAIRMENTS: Abnormal gait, decreased activity tolerance, decreased balance, decreased endurance, difficulty walking, decreased strength, improper body mechanics, and pain.   ACTIVITY LIMITATIONS: carrying, lifting, squatting, stairs, transfers, and locomotion level  PARTICIPATION LIMITATIONS: meal prep, cleaning, laundry, and community activity  PERSONAL FACTORS: Age, Time since onset of injury/illness/exacerbation, and 3+ comorbidities: HTN, DM, patellar fracture L knee  are also affecting patient's functional outcome.   REHAB POTENTIAL: Fair given chronicity, surgical history, and comorbidities  CLINICAL DECISION MAKING: Stable/uncomplicated  EVALUATION COMPLEXITY: Low   GOALS:  SHORT TERM GOALS: Target date: 11/17/2023 Pt will demonstrate appropriate understanding and performance of initially prescribed HEP in order to facilitate improved independence with management of symptoms.  Baseline: HEP provided on eval 11/17/23: reports good HEP adherence Goal status:  MET   2. Pt will be able to perform 5xSTS in </= 14sec in order to facilitate reduced fall risk and improved functional mobility.  Baseline: 16sec Goal status: ONGOING   LONG TERM GOALS: Target date: 12/15/2023 Pt will meet predicted score or greater on FOTO in order to demonstrate improved perception of function due to symptoms.  Baseline: FOTO TBD Goal status: INITIAL  2.  Pt will report ability to navigate up to 1 flight of stairs w/ reciprocal pattern and less than 2pt increase in resting pain in order to facilitate improved community access. Baseline: reports reciprocal pattern and rail use Goal status:  INITIAL  3.  Pt will score greater than or equal to 26/30 on Functional Gait assessment in order to indicate reduced fall risk (cutoff score </= 22/30 predictive of falls per Alvino Chapel et al 2010, MCID 4 pts Beninato et al 2014)  Baseline: 22/30  Goal status: INITIAL   4.  Pt will be able to perform 5xSTS in less than or equal to 12 in order to demonstrate reduced fall risk and improved functional independence (MCID 5xSTS = 2.3 sec). Baseline: 16sec Goal status: INITIAL    PLAN:  PT FREQUENCY: 1x/week  PT DURATION: 8 weeks  PLANNED INTERVENTIONS: 97164- PT Re-evaluation, 97110-Therapeutic exercises, 97530- Therapeutic activity, [id number]- Neuromuscular re-education, 97535- Self Care, 54098- Manual therapy, [phone]- Gait training, [phone]- Aquatic Therapy, Patient/Family education, Balance training, Stair training, Taping, Dry Needling, Joint mobilization, Scar mobilization, Cryotherapy, and Moist heat  PLAN FOR NEXT SESSION: Review/update HEP PRN. Working on quad stability/strength, balance. Symptom modification strategies as indicated/appropriate. Mindful of ORIF/fracture, recommend close monitoring of symptom response and swelling with activities, modifications as needed.    Ashley Murrain PT, DPT 11/24/2023 11:13 AM

## 2023-11-23 NOTE — Telephone Encounter (Signed)
 Patient is request labs be done prior to his physical on 11/26/23. Please order.

## 2023-11-24 ENCOUNTER — Ambulatory Visit: Payer: 59 | Admitting: Physical Therapy

## 2023-11-24 ENCOUNTER — Encounter: Payer: Self-pay | Admitting: Physical Therapy

## 2023-11-24 DIAGNOSIS — R2689 Other abnormalities of gait and mobility: Secondary | ICD-10-CM

## 2023-11-24 DIAGNOSIS — M25562 Pain in left knee: Secondary | ICD-10-CM

## 2023-11-24 NOTE — Telephone Encounter (Signed)
 Please contact pt and advise labs have been ordered. Thanks

## 2023-11-25 DIAGNOSIS — E785 Hyperlipidemia, unspecified: Secondary | ICD-10-CM | POA: Diagnosis not present

## 2023-11-25 DIAGNOSIS — Z0000 Encounter for general adult medical examination without abnormal findings: Secondary | ICD-10-CM | POA: Diagnosis not present

## 2023-11-26 ENCOUNTER — Encounter: Payer: Self-pay | Admitting: Family Medicine

## 2023-11-26 ENCOUNTER — Ambulatory Visit (INDEPENDENT_AMBULATORY_CARE_PROVIDER_SITE_OTHER): Payer: 59 | Admitting: Family Medicine

## 2023-11-26 VITALS — BP 153/88 | HR 103 | Ht 75.0 in | Wt 274.5 lb

## 2023-11-26 DIAGNOSIS — Z0000 Encounter for general adult medical examination without abnormal findings: Secondary | ICD-10-CM | POA: Diagnosis not present

## 2023-11-26 DIAGNOSIS — E1169 Type 2 diabetes mellitus with other specified complication: Secondary | ICD-10-CM | POA: Diagnosis not present

## 2023-11-26 LAB — CMP14+EGFR
ALT: 40 IU/L (ref 0–44)
AST: 27 IU/L (ref 0–40)
Albumin: 4.4 g/dL (ref 3.9–4.9)
Alkaline Phosphatase: 89 IU/L (ref 44–121)
BUN/Creatinine Ratio: 16 (ref 10–24)
BUN: 15 mg/dL (ref 8–27)
Bilirubin Total: 1 mg/dL (ref 0.0–1.2)
CO2: 21 mmol/L (ref 20–29)
Calcium: 9.7 mg/dL (ref 8.6–10.2)
Chloride: 102 mmol/L (ref 96–106)
Creatinine, Ser: 0.94 mg/dL (ref 0.76–1.27)
Globulin, Total: 2.5 g/dL (ref 1.5–4.5)
Glucose: 137 mg/dL — ABNORMAL HIGH (ref 70–99)
Potassium: 3.9 mmol/L (ref 3.5–5.2)
Sodium: 142 mmol/L (ref 134–144)
Total Protein: 6.9 g/dL (ref 6.0–8.5)
eGFR: 91 mL/min/{1.73_m2} (ref >59)

## 2023-11-26 LAB — POCT GLYCOSYLATED HEMOGLOBIN (HGB A1C): Hemoglobin A1C: 6.9 % — AB (ref 4.0–5.6)

## 2023-11-26 LAB — LIPID PANEL
Chol/HDL Ratio: 3.3 ratio (ref 0.0–5.0)
Cholesterol, Total: 121 mg/dL (ref 100–199)
HDL: 37 mg/dL — ABNORMAL LOW (ref >39)
LDL Chol Calc (NIH): 62 mg/dL (ref 0–99)
Triglycerides: 122 mg/dL (ref 0–149)
VLDL Cholesterol Cal: 22 mg/dL (ref 5–40)

## 2023-11-26 LAB — CBC
Hematocrit: 46.7 % (ref 37.5–51.0)
Hemoglobin: 15.7 g/dL (ref 13.0–17.7)
MCH: 31.1 pg (ref 26.6–33.0)
MCHC: 33.6 g/dL (ref 31.5–35.7)
MCV: 93 fL (ref 79–97)
Platelets: 170 10*3/uL (ref 150–450)
RBC: 5.05 x10E6/uL (ref 4.14–5.80)
RDW: 12.4 % (ref 11.6–15.4)
WBC: 7.5 10*3/uL (ref 3.4–10.8)

## 2023-11-26 MED ORDER — TADALAFIL 20 MG PO TABS: ORAL_TABLET | ORAL | 11 refills | Status: DC

## 2023-11-26 NOTE — Progress Notes (Signed)
 James Ware - 63 y.o. male MRN [id number]  Date of birth: 04-21-1961  Subjective Chief Complaint  Patient presents with   Annual Exam    T2DM last A1c 6.6    HPI James Ware is a 63 y.o. male here today for annual exam.   He reports that he is doing pretty well.  He is back in PT for his knee, doing well with this.   A1c is increased slightly. Working on increasing activity and making dietary changes.   He is a non-smoker.  Occasional EtOH.   Review of Systems  Constitutional:  Negative for chills, fever, malaise/fatigue and weight loss.  HENT:  Negative for congestion, ear pain and sore throat.   Eyes:  Negative for blurred vision, double vision and pain.  Respiratory:  Negative for cough and shortness of breath.   Cardiovascular:  Negative for chest pain and palpitations.  Gastrointestinal:  Negative for abdominal pain, blood in stool, constipation, heartburn and nausea.  Genitourinary:  Negative for dysuria and urgency.  Musculoskeletal:  Negative for joint pain and myalgias.  Neurological:  Negative for dizziness and headaches.  Endo/Heme/Allergies:  Does not bruise/bleed easily.  Psychiatric/Behavioral:  Negative for depression. The patient is not nervous/anxious and does not have insomnia.     No Known Allergies  Past Medical History:  Diagnosis Date   Arthritis    left knee   Eczema 05/24/2017   Essential hypertension 05/24/2017   Hyperlipidemia    Prediabetes 05/24/2017   Venous stasis dermatitis of both lower extremities 05/24/2017    Past Surgical History:  Procedure Laterality Date   COLONOSCOPY     TONSILLECTOMY     in first grade   TYMPANOSTOMY TUBE PLACEMENT     as a child   WISDOM TOOTH EXTRACTION      Social History   Socioeconomic History   Marital status: Divorced    Spouse name: Not on file   Number of children: Not on file   Years of education: Not on file   Highest education level: Professional school degree (e.g., MD, DDS, DVM, JD)   Occupational History   Not on file  Tobacco Use   Smoking status: Former   Smokeless tobacco: Never  Vaping Use   Vaping status: Never Used  Substance and Sexual Activity   Alcohol use: Yes    Comment: 1-2 drinks per week   Drug use: Never   Sexual activity: Not on file  Other Topics Concern   Not on file  Social History Narrative   Not on file   Social Drivers of Health   Financial Resource Strain: Low Risk  (11/25/2023)   Overall Financial Resource Strain (CARDIA)    Difficulty of Paying Living Expenses: Not hard at all  Food Insecurity: No Food Insecurity (11/25/2023)   Hunger Vital Sign    Worried About Running Out of Food in the Last Year: Never true    Ran Out of Food in the Last Year: Never true  Transportation Needs: No Transportation Needs (11/25/2023)   PRAPARE - Administrator, Civil Service (Medical): No    Lack of Transportation (Non-Medical): No  Physical Activity: Insufficiently Active (11/25/2023)   Exercise Vital Sign    Days of Exercise per Week: 3 days    Minutes of Exercise per Session: 30 min  Stress: No Stress Concern Present (11/25/2023)   [employer] of Occupational Health - Occupational Stress Questionnaire    Feeling of Stress : Only a little  Social Connections: Moderately Integrated (11/25/2023)   Social Connection and Isolation Panel [NHANES]    Frequency of Communication with Friends and Family: More than three times a week    Frequency of Social Gatherings with Friends and Family: Once a week    Attends Religious Services: More than 4 times per year    Active Member of Clubs or Organizations: Yes    Attends Engineer, structural: More than 4 times per year    Marital Status: Divorced    Family History  Problem Relation Age of Onset   Colon cancer Mother 76   Hyperlipidemia Mother    Cancer Father        bladder   Esophageal cancer Neg Hx    Rectal cancer Neg Hx    Stomach cancer Neg Hx     Health Maintenance   Topic Date Due   COVID-19 Vaccine (8 - 2024-25 season) 08/09/2023   Diabetic kidney evaluation - Urine ACR  09/12/2023   HIV Screening  04/20/2024 (Originally 04/26/1976)   HEMOGLOBIN A1C  05/25/2024   FOOT EXAM  08/18/2024   OPHTHALMOLOGY EXAM  09/30/2024   Diabetic kidney evaluation - eGFR measurement  11/24/2024   Colonoscopy  09/12/2027   DTaP/Tdap/Td (3 - Td or Tdap) 10/31/2030   Pneumococcal Vaccine 57-69 Years old  Completed   INFLUENZA VACCINE  Completed   Hepatitis C Screening  Completed   Zoster Vaccines- Shingrix  Completed   HPV VACCINES  Aged Out     ----------------------------------------------------------------------------------------------------------------------------------------------------------------------------------------------------------------- Physical Exam BP (!) 153/88 (BP Location: Left Arm, Patient Position: Sitting, Cuff Size: Large)   Pulse (!) 103   Ht 6\' 3"  (1.905 m)   Wt 274 lb 8 oz (124.5 kg)   SpO2 100%   BMI 34.31 kg/m   Physical Exam Constitutional:      General: He is not in acute distress. HENT:     Head: Normocephalic and atraumatic.     Right Ear: Tympanic membrane and external ear normal.     Left Ear: Tympanic membrane and external ear normal.  Eyes:     General: No scleral icterus. Neck:     Thyroid: No thyromegaly.  Cardiovascular:     Rate and Rhythm: Normal rate and regular rhythm.     Heart sounds: Normal heart sounds.  Pulmonary:     Effort: Pulmonary effort is normal.     Breath sounds: Normal breath sounds.  Abdominal:     General: Bowel sounds are normal. There is no distension.     Palpations: Abdomen is soft.     Tenderness: There is no abdominal tenderness. There is no guarding.  Musculoskeletal:     Cervical back: Normal range of motion.  Lymphadenopathy:     Cervical: No cervical adenopathy.  Skin:    General: Skin is warm and dry.     Findings: No rash.  Neurological:     Mental Status: He is alert  and oriented to person, place, and time.     Cranial Nerves: No cranial nerve deficit.     Motor: No abnormal muscle tone.  Psychiatric:        Mood and Affect: Mood normal.        Behavior: Behavior normal.     ------------------------------------------------------------------------------------------------------------------------------------------------------------------------------------------------------------------- Assessment and Plan  Well adult exam Well adult Recent labs reviewed with him.  Screenings: UTD Immunizations UTD Anticipatory guidance/Risk factor reduction:  recommendations per AVS.  Recommend increased activity for weight loss and improvement of A1c.  Meds ordered this encounter  Medications   tadalafil (CIALIS) 20 MG tablet    Sig: TAKE  1/2 TO 1 TABLET BY MOUTH EVERY DAY AS NEEDED FOR ERECTILE DYSFUNCTION    Dispense:  10 tablet    Refill:  11    Return in about 6 months (around 05/25/2024) for Type 2 Diabetes, Hypertension.    This visit occurred during the SARS-CoV-2 public health emergency.  Safety protocols were in place, including screening questions prior to the visit, additional usage of staff PPE, and extensive cleaning of exam room while observing appropriate contact time as indicated for disinfecting solutions.

## 2023-11-26 NOTE — Assessment & Plan Note (Signed)
 Well adult Recent labs reviewed with him.  Screenings: UTD Immunizations UTD Anticipatory guidance/Risk factor reduction:  recommendations per AVS.  Recommend increased activity for weight loss and improvement of A1c.

## 2023-11-30 NOTE — Therapy (Signed)
 OUTPATIENT PHYSICAL THERAPY TREATMENT   Patient Name: James Ware MRN: [id number] DOB:1961/02/13, 63 y.o., male Today's Date: 12/01/2023  END OF SESSION:  PT End of Session - 12/01/23 0844     Visit Number 7    Number of Visits 9    Date for PT Re-Evaluation 12/15/23    Authorization Type aetna    Authorization Time Period 30VL no auth required    Authorization - Visit Number 7    Authorization - Number of Visits 30    PT Start Time 0845    PT Stop Time 0930    PT Time Calculation (min) 45 min    Activity Tolerance Patient tolerated treatment well                   Past Medical History:  Diagnosis Date   Arthritis    left knee   Eczema 05/24/2017   Essential hypertension 05/24/2017   Hyperlipidemia    Prediabetes 05/24/2017   Venous stasis dermatitis of both lower extremities 05/24/2017   Past Surgical History:  Procedure Laterality Date   COLONOSCOPY     TONSILLECTOMY     in first grade   TYMPANOSTOMY TUBE PLACEMENT     as a child   WISDOM TOOTH EXTRACTION     Patient Active Problem List   Diagnosis Date Noted   Closed patellar sleeve fracture of left knee 11/30/2022   Primary osteoarthritis of left hip 12/05/2021   Well adult exam 08/17/2021   Actinic skin damage - scalp and forehead, cryotherapy applied 02/13/21 02/13/2021   Seasonal allergic rhinitis 05/22/2019   Erectile dysfunction 04/11/2019   Hearing loss 12/16/2017   HLD (hyperlipidemia) 05/27/2017   Hypertension associated with diabetes (HCC) 05/24/2017   Eczema 05/24/2017   Venous stasis dermatitis of both lower extremities 05/24/2017   Type 2 diabetes mellitus with other specified complication (HCC) 05/24/2017    PCP: Everrett Coombe, DO  REFERRING PROVIDER: Vale Haven, MD  REFERRING DIAG: Displaced comminuted fracture of left patella, initial encounter for closed fracture (W09.811B) ;    THERAPY DIAG:  Left knee pain, unspecified chronicity  Other abnormalities of  gait and mobility  Rationale for Evaluation and Treatment: Rehabilitation  ONSET DATE: March 2024  SUBJECTIVE:   Per eval - Pt describes himself as historically quite active - enjoys jogging, golfing, playing tennis. States in March of 2024 he had a fall on a sidewalk and fractured his patella, requiring ORIF. States he was receiving PT, everything going well overall until around August, when follow up imaging showed non-union of fracture. Pt states he continued to improve with PT and fracture healing was monitored with follow up imaging, most recent a couple weeks ago. He endorses that imaging has not been decisive re: fracture healing and he may end up having another surgery. He states his surgeon would like for him to return to PT to maximize quad strength and improve balance (ran out of insurance visits last year). He states he doesn't really have much pain overall, primary issue is a "hitch" or "catch" at times, will also have swelling if he overdoes it. States with prior bout of PT he was working on balance, resistance bands, closed chain movements, bike, and mobility work. He denies any formal restrictions at this point.   SUBJECTIVE STATEMENT: 12/01/2023 Pt states he did okay after last session. He states he tried some of the isometrics and did well with them. No swelling, no need for pain pills. Did a floor  transfer yesterday, noted he had some sensitivity in kneeling position    PERTINENT HISTORY: HTN, DM, patellar fracture L knee March 2024 w/ ORIF  PAIN:  Are you having pain: no pain at present   Per eval -  Location/description: R knee Best-worst over past week: 0-5/10  - aggravating factors: leading w L leg up stairs, STS and first few steps, uneven surfaces, walking down decline - Easing factors: rest, medication  PRECAUTIONS: pt denies any specific restrictions Per paper referral special instructions: "balance/gait, quadriceps strengthening, knee stabilization exercises  LLE"  WEIGHT BEARING RESTRICTIONS: No  FALLS:  Has patient fallen in last 6 months? No  LIVING ENVIRONMENT: Has a roommate/partner ; 1STE, level home  OCCUPATION: was an Catering manager for [employer], currently looking for work with history consulting  PLOF: Independent  PATIENT GOALS: get back to playing tennis, walking more. Enjoys being active  NEXT MD VISIT: mid April   OBJECTIVE:  Note: Objective measures were completed at Evaluation unless otherwise noted.  DIAGNOSTIC FINDINGS:  No recent imaging in chart - pt states he has had follow up imaging since surgery, most recent a couple of weeks ago. States he will bring in radiology impression L patellar ORIF 12/02/22   PATIENT SURVEYS:  FOTO deferred at eval - not yet set up in system  COGNITION: Overall cognitive status: Within functional limits for tasks assessed     SENSATION: Does not endorse any sensory complaints  EDEMA:  Endorses fluctuating edema about knee - not significant on observation today   LOWER EXTREMITY ROM:     Active  Right eval Left eval  Hip flexion    Hip extension    Hip internal rotation    Hip external rotation    Knee extension full Full   Knee flexion  129 painless   (Blank rows = not tested) (Key: WFL = within functional limits not formally assessed, * = concordant pain, s = stiffness/stretching sensation, NT = not tested)  Comments:    LOWER EXTREMITY MMT:    MMT Right eval Left eval  Hip flexion    Hip abduction (modified sitting)    Hip internal rotation    Hip external rotation    Knee flexion 5 3+ (mild pinch, no overt pain)  Knee extension 5 5  Ankle dorsiflexion     (Blank rows = not tested) (Key: WFL = within functional limits not formally assessed, * = concordant pain, s = stiffness/stretching sensation, NT = not tested)  Comments: with L knee ext MMT - minimal PT resistance applied, gradual build  LOWER EXTREMITY SPECIAL TESTS:  SLR without lag or pain, 10 reps  equal BIL  FUNCTIONAL TESTS:  5xSTS: 16.26sec no UE support, mild popping but no pain  GAIT: Distance walked: within clinic Assistive device utilized: None Level of assistance: Complete Independence Comments: mildly reduced stance time LLE, occasional hyperextension L knee in stance   FGA on eval:  -Item 1 Gait Level Surface: mild impairment 2  -Item 2 Change in Gait Speed: mild impairment 2  -Item 3 Gait with Horizontal Head Turns: mild impairment 2  -Item 4 Gait with Vertical Head Turns: Normal 3  -Item 5 Gait with Pivot Turn: Normal 3  -Item 6 Step Over Obstacle: mild impairment 2  -Item 7 Gait with Narrow Base of Support: Normal 3  -Item 8 Gait with Eyes Closed: mild impairment 2             -Item 9 Ambulating Backwards: mild impairment  2  -Item 10 Steps: moderate impairment 1  (pt self report) Total: 22 /30  * Score of <=22/30 indicates that patient is at increased risk for falls.                                                                                                                                 TREATMENT DATE:  [facility] Adult PT Treatment:                                                DATE: 12/01/23 Therapeutic Exercise: SLR (IR, ER, neutral) x8 each cues for quad contraction  Quad isometric at wall w/ towel, 2x8 (~60 deg) cues for comfortable force output, positioning  SAQ on foam roll for inc ROM 2x10 cues for quad contraction HEP update + education/handout  Neuromuscular re-ed: Dynadisc kickstand step up w/ UE support 2x8 cues for eccentric quad control  1 lap tandem walk at counter SBA  3 laps tandem walk on airex beam CGA cues for posture and foot placement Stop/go reactive balance 3 laps cues for quick/soft movements    [facility] Adult PT Treatment:                                                DATE: 11/24/23 Therapeutic Exercise: Unresisted LAQ 3x10 within comfortable ROM (~50deg) Knee extension isometric (~60deg) 2x5 w/ 5sec hold, submax and gradual  build within tolerance LLE Mini squat x10 emphasis on comfortable ROM and symmetrical WB  HEP discussion/education   Neuromuscular re-ed: Fwd step up x15 w/ LLE onto airex, cues for trunk positioning and quad control Airex lateral step up x15 LLE cues for foot positioning, quad control, posture With both versions of step ups, frequent rest and time taken throughout to educate/cue on different mechanics and checking in on symptoms  Self Care: Continued education/discussion re: monitoring symptoms, appropriate activity modification, self progression/regression of activity based on symptom response   [facility] Adult PT Treatment:                                                DATE: 11/17/23 Therapeutic Exercise: Unresisted LAQ 2x12 within comfortable ROM (90-50, checked w/ goniometer) Supine SAQ w/ towel under knee, ~15 deg 2x8 SLR x12 LLE, x8 w/ toes in  Mini squat w/ UE support 2x8 within comfortable ROM HEP handout + education, relevant anatomy/physiology and rationale for interventions  Neuromuscular re-ed: Toe walking along counter 3 laps weaning UE support CGA initially > supervision Toe raises 2x15 emphasis on posture and quad  activation   PATIENT EDUCATION:  Education details: rationale for interventions, HEP  Person educated: Patient Education method: Explanation, Demonstration, Tactile cues, Verbal cues Education comprehension: verbalized understanding, returned demonstration, verbal cues required, tactile cues required, and needs further education     HOME EXERCISE PROGRAM: Access Code: [email or web address] URL: [email or web address] Date: 12/01/2023 Prepared by: Fransisco Hertz  Program Notes - with quad set, please do with gentle push into wall, knee around 60 deg as done in clinic  Exercises - Active Straight Leg Raise with Quad Set  - 2-3 x daily - 1 sets - 12 reps - Side Stepping with Resistance at Thighs and Counter Support  - 2-3 x daily - 1 sets - 10 reps - Heel  Toe Raises with Counter Support  - 2-3 x daily - 1 sets - 8 reps - Mini Squat with Counter Support  - 2-3 x daily - 1 sets - 5-8 reps - Supine Short Arc Quad  - 2-3 x daily - 1 sets - 6-8 reps - Seated Quad Set  - 2-3 x daily - 1 sets - 5 reps  ASSESSMENT:  CLINICAL IMPRESSION: 12/01/2023 Pt arrives w/o pain, no issues after last session and states he tried quad isometrics at home without issue. Today we continue focusing on quad endurance and progression with isometrics which pt does well with, improved tolerance compared to last session. Also building volume/complexity with balance activities. No adverse events, pt reports no pain on departure, cues as above. HEP updated to include gentle quad isometrics given good tolerance today and last session. Recommend continuing along current POC in order to address relevant deficits and improve functional tolerance. Pt departs today's session in no acute distress, all voiced questions/concerns addressed appropriately from PT perspective.     Per eval - Patient is a pleasant 63 y.o. gentleman who was seen today for physical therapy evaluation and treatment for balance/gait in context of L patellar fracture w/ ORIF March 2024. Pt denies formal restrictions at this point but states he may require additional surgery for non-union, goal of PT is to maximize quad strength and functional mobility. He states at present he notes the most difficulty with stair navigation, incline/declines, uneven surfaces, and sit<>stand after prolonged sitting. Doesn't have much pain but does have occasional swelling and popping. On exam he demonstrates significant reduction in L quad strength although does have good ROM. 5xSTS is within fall risk category (12-14sec cutoff score). FGA score is at cutoff for fall risk (22/30), demonstrates mild instability with dynamic tasks but no overt LOB. At present recommend skilled PT to address relevant impairments with focus on quad stability and  functional mobility. Pt tolerates exam/HEP well without adverse event or increase in resting pain. Pt departs today's session in no acute distress, all voiced questions/concerns addressed appropriately from PT perspective.    OBJECTIVE IMPAIRMENTS: Abnormal gait, decreased activity tolerance, decreased balance, decreased endurance, difficulty walking, decreased strength, improper body mechanics, and pain.   ACTIVITY LIMITATIONS: carrying, lifting, squatting, stairs, transfers, and locomotion level  PARTICIPATION LIMITATIONS: meal prep, cleaning, laundry, and community activity  PERSONAL FACTORS: Age, Time since onset of injury/illness/exacerbation, and 3+ comorbidities: HTN, DM, patellar fracture L knee  are also affecting patient's functional outcome.   REHAB POTENTIAL: Fair given chronicity, surgical history, and comorbidities  CLINICAL DECISION MAKING: Stable/uncomplicated  EVALUATION COMPLEXITY: Low   GOALS:  SHORT TERM GOALS: Target date: 11/17/2023 Pt will demonstrate appropriate understanding and performance of initially prescribed HEP in order to facilitate improved independence with  management of symptoms.  Baseline: HEP provided on eval 11/17/23: reports good HEP adherence Goal status: MET   2. Pt will be able to perform 5xSTS in </= 14sec in order to facilitate reduced fall risk and improved functional mobility.  Baseline: 16sec Goal status: ONGOING   LONG TERM GOALS: Target date: 12/15/2023 Pt will meet predicted score or greater on FOTO in order to demonstrate improved perception of function due to symptoms.  Baseline: FOTO TBD Goal status: INITIAL  2.  Pt will report ability to navigate up to 1 flight of stairs w/ reciprocal pattern and less than 2pt increase in resting pain in order to facilitate improved community access. Baseline: reports reciprocal pattern and rail use Goal status: INITIAL  3.  Pt will score greater than or equal to 26/30 on Functional Gait  assessment in order to indicate reduced fall risk (cutoff score </= 22/30 predictive of falls per Alvino Chapel et al 2010, MCID 4 pts Beninato et al 2014)  Baseline: 22/30  Goal status: INITIAL   4.  Pt will be able to perform 5xSTS in less than or equal to 12 in order to demonstrate reduced fall risk and improved functional independence (MCID 5xSTS = 2.3 sec). Baseline: 16sec Goal status: INITIAL    PLAN:  PT FREQUENCY: 1x/week  PT DURATION: 8 weeks  PLANNED INTERVENTIONS: 97164- PT Re-evaluation, 97110-Therapeutic exercises, 97530- Therapeutic activity, [id number]- Neuromuscular re-education, 97535- Self Care, 16109- Manual therapy, [phone]- Gait training, [phone]- Aquatic Therapy, Patient/Family education, Balance training, Stair training, Taping, Dry Needling, Joint mobilization, Scar mobilization, Cryotherapy, and Moist heat  PLAN FOR NEXT SESSION: Review/update HEP PRN. Working on quad stability/strength, balance. Symptom modification strategies as indicated/appropriate. Mindful of ORIF/fracture, recommend close monitoring of symptom response and swelling with activities, modifications as needed.    Ashley Murrain PT, DPT 12/01/2023 11:16 AM

## 2023-12-01 ENCOUNTER — Ambulatory Visit: Payer: 59 | Attending: Orthopedic Surgery | Admitting: Physical Therapy

## 2023-12-01 ENCOUNTER — Encounter: Payer: Self-pay | Admitting: Physical Therapy

## 2023-12-01 DIAGNOSIS — M25562 Pain in left knee: Secondary | ICD-10-CM | POA: Insufficient documentation

## 2023-12-01 DIAGNOSIS — R2689 Other abnormalities of gait and mobility: Secondary | ICD-10-CM | POA: Insufficient documentation

## 2023-12-07 NOTE — Therapy (Signed)
 OUTPATIENT PHYSICAL THERAPY TREATMENT   Patient Name: James Ware MRN: [id number] DOB:Jan 16, 1961, 63 y.o., male Today's Date: 12/08/2023  END OF SESSION:  PT End of Session - 12/08/23 0933     Visit Number 8    Number of Visits 9    Date for PT Re-Evaluation 12/15/23    Authorization Type aetna    Authorization Time Period 30VL no auth required    Authorization - Visit Number 8    Authorization - Number of Visits 30    PT Start Time [phone]    PT Stop Time 1015    PT Time Calculation (min) 41 min    Activity Tolerance Patient tolerated treatment well                    Past Medical History:  Diagnosis Date   Arthritis    left knee   Eczema 05/24/2017   Essential hypertension 05/24/2017   Hyperlipidemia    Prediabetes 05/24/2017   Venous stasis dermatitis of both lower extremities 05/24/2017   Past Surgical History:  Procedure Laterality Date   COLONOSCOPY     TONSILLECTOMY     in first grade   TYMPANOSTOMY TUBE PLACEMENT     as a child   WISDOM TOOTH EXTRACTION     Patient Active Problem List   Diagnosis Date Noted   Closed patellar sleeve fracture of left knee 11/30/2022   Primary osteoarthritis of left hip 12/05/2021   Well adult exam 08/17/2021   Actinic skin damage - scalp and forehead, cryotherapy applied 02/13/21 02/13/2021   Seasonal allergic rhinitis 05/22/2019   Erectile dysfunction 04/11/2019   Hearing loss 12/16/2017   HLD (hyperlipidemia) 05/27/2017   Hypertension associated with diabetes (HCC) 05/24/2017   Eczema 05/24/2017   Venous stasis dermatitis of both lower extremities 05/24/2017   Type 2 diabetes mellitus with other specified complication (HCC) 05/24/2017    PCP: Everrett Coombe, DO  REFERRING PROVIDER: Vale Haven, MD  REFERRING DIAG: Displaced comminuted fracture of left patella, initial encounter for closed fracture (J19.147W) ;    THERAPY DIAG:  Left knee pain, unspecified chronicity  Other abnormalities of  gait and mobility  Rationale for Evaluation and Treatment: Rehabilitation  ONSET DATE: March 2024  SUBJECTIVE:   Per eval - Pt describes himself as historically quite active - enjoys jogging, golfing, playing tennis. States in March of 2024 he had a fall on a sidewalk and fractured his patella, requiring ORIF. States he was receiving PT, everything going well overall until around August, when follow up imaging showed non-union of fracture. Pt states he continued to improve with PT and fracture healing was monitored with follow up imaging, most recent a couple weeks ago. He endorses that imaging has not been decisive re: fracture healing and he may end up having another surgery. He states his surgeon would like for him to return to PT to maximize quad strength and improve balance (ran out of insurance visits last year). He states he doesn't really have much pain overall, primary issue is a "hitch" or "catch" at times, will also have swelling if he overdoes it. States with prior bout of PT he was working on balance, resistance bands, closed chain movements, bike, and mobility work. He denies any formal restrictions at this point.   SUBJECTIVE STATEMENT: 12/08/2023 states he had a few days of increased stiffness but no extra pain, no issues after last session. HEP is going well, starting to feel less tension with isometrics, a  little bit of increased ROM with LAQ. No other new updates   PERTINENT HISTORY: HTN, DM, patellar fracture L knee March 2024 w/ ORIF  PAIN:  Are you having pain: no pain at present   Per eval -  Location/description: R knee Best-worst over past week: 0-5/10  - aggravating factors: leading w L leg up stairs, STS and first few steps, uneven surfaces, walking down decline - Easing factors: rest, medication  PRECAUTIONS: pt denies any specific restrictions Per paper referral special instructions: "balance/gait, quadriceps strengthening, knee stabilization exercises  LLE"  WEIGHT BEARING RESTRICTIONS: No  FALLS:  Has patient fallen in last 6 months? No  LIVING ENVIRONMENT: Has a roommate/partner ; 1STE, level home  OCCUPATION: was an Catering manager for [employer], currently looking for work with history consulting  PLOF: Independent  PATIENT GOALS: get back to playing tennis, walking more. Enjoys being active  NEXT MD VISIT: mid April   OBJECTIVE:  Note: Objective measures were completed at Evaluation unless otherwise noted.  DIAGNOSTIC FINDINGS:  No recent imaging in chart - pt states he has had follow up imaging since surgery, most recent a couple of weeks ago. States he will bring in radiology impression L patellar ORIF 12/02/22   PATIENT SURVEYS:  FOTO deferred at eval - not yet set up in system  COGNITION: Overall cognitive status: Within functional limits for tasks assessed     SENSATION: Does not endorse any sensory complaints  EDEMA:  Endorses fluctuating edema about knee - not significant on observation today   LOWER EXTREMITY ROM:     Active  Right eval Left eval  Hip flexion    Hip extension    Hip internal rotation    Hip external rotation    Knee extension full Full   Knee flexion  129 painless   (Blank rows = not tested) (Key: WFL = within functional limits not formally assessed, * = concordant pain, s = stiffness/stretching sensation, NT = not tested)  Comments:    LOWER EXTREMITY MMT:    MMT Right eval Left eval  Hip flexion    Hip abduction (modified sitting)    Hip internal rotation    Hip external rotation    Knee flexion 5 3+ (mild pinch, no overt pain)  Knee extension 5 5  Ankle dorsiflexion     (Blank rows = not tested) (Key: WFL = within functional limits not formally assessed, * = concordant pain, s = stiffness/stretching sensation, NT = not tested)  Comments: with L knee ext MMT - minimal PT resistance applied, gradual build  LOWER EXTREMITY SPECIAL TESTS:  SLR without lag or pain, 10 reps  equal BIL  FUNCTIONAL TESTS:  5xSTS: 16.26sec no UE support, mild popping but no pain  GAIT: Distance walked: within clinic Assistive device utilized: None Level of assistance: Complete Independence Comments: mildly reduced stance time LLE, occasional hyperextension L knee in stance   FGA on eval:  -Item 1 Gait Level Surface: mild impairment 2  -Item 2 Change in Gait Speed: mild impairment 2  -Item 3 Gait with Horizontal Head Turns: mild impairment 2  -Item 4 Gait with Vertical Head Turns: Normal 3  -Item 5 Gait with Pivot Turn: Normal 3  -Item 6 Step Over Obstacle: mild impairment 2  -Item 7 Gait with Narrow Base of Support: Normal 3  -Item 8 Gait with Eyes Closed: mild impairment 2             -Item 9 Ambulating Backwards: mild impairment  2  -Item 10 Steps: moderate impairment 1  (pt self report) Total: 22 /30  * Score of <=22/30 indicates that patient is at increased risk for falls.                                                                                                                                 TREATMENT DATE:  [facility] Adult PT Treatment:                                                DATE: 12/08/23 Therapeutic Exercise: SAQ on raised bolster 2x12 cues for reduced compensations  Sidelying hip abduction LLE 2x8 cues for positioning HEP discussion/education   Neuromuscular re-ed: Knee ext iso into physio ball 2x8 w/ 3sec hold Airex balance beam tandem walk 4 laps weaning UE support CGA Treadmill at 5 incline, belt not moving, working on incline/decline balance 5 laps Fwd walking on mat w/ dual tasking for compliant surfaces 5 laps  Lateral walking on mat 3 laps with cognitive dual tasking    [facility] Adult PT Treatment:                                                DATE: 12/01/23 Therapeutic Exercise: SLR (IR, ER, neutral) x8 each cues for quad contraction  Quad isometric at wall w/ towel, 2x8 (~60 deg) cues for comfortable force output, positioning  SAQ on foam  roll for inc ROM 2x10 cues for quad contraction HEP update + education/handout  Neuromuscular re-ed: Dynadisc kickstand step up w/ UE support 2x8 cues for eccentric quad control  1 lap tandem walk at counter SBA  3 laps tandem walk on airex beam CGA cues for posture and foot placement Stop/go reactive balance 3 laps cues for quick/soft movements    [facility] Adult PT Treatment:                                                DATE: 11/24/23 Therapeutic Exercise: Unresisted LAQ 3x10 within comfortable ROM (~50deg) Knee extension isometric (~60deg) 2x5 w/ 5sec hold, submax and gradual build within tolerance LLE Mini squat x10 emphasis on comfortable ROM and symmetrical WB  HEP discussion/education   Neuromuscular re-ed: Fwd step up x15 w/ LLE onto airex, cues for trunk positioning and quad control Airex lateral step up x15 LLE cues for foot positioning, quad control, posture With both versions of step ups, frequent rest and time taken throughout to educate/cue on different mechanics and checking in on symptoms  Self Care: Continued education/discussion re: monitoring  symptoms, appropriate activity modification, self progression/regression of activity based on symptom response   [facility] Adult PT Treatment:                                                DATE: 11/17/23 Therapeutic Exercise: Unresisted LAQ 2x12 within comfortable ROM (90-50, checked w/ goniometer) Supine SAQ w/ towel under knee, ~15 deg 2x8 SLR x12 LLE, x8 w/ toes in  Mini squat w/ UE support 2x8 within comfortable ROM HEP handout + education, relevant anatomy/physiology and rationale for interventions  Neuromuscular re-ed: Toe walking along counter 3 laps weaning UE support CGA initially > supervision Toe raises 2x15 emphasis on posture and quad activation   PATIENT EDUCATION:  Education details: rationale for interventions, HEP  Person educated: Patient Education method: Explanation, Demonstration, Tactile cues, Verbal  cues Education comprehension: verbalized understanding, returned demonstration, verbal cues required, tactile cues required, and needs further education     HOME EXERCISE PROGRAM: Access Code: [email or web address] URL: [email or web address] Date: 12/01/2023 Prepared by: Fransisco Hertz  Program Notes - with quad set, please do with gentle push into wall, knee around 60 deg as done in clinic  Exercises - Active Straight Leg Raise with Quad Set  - 2-3 x daily - 1 sets - 12 reps - Side Stepping with Resistance at Thighs and Counter Support  - 2-3 x daily - 1 sets - 10 reps - Heel Toe Raises with Counter Support  - 2-3 x daily - 1 sets - 8 reps - Mini Squat with Counter Support  - 2-3 x daily - 1 sets - 5-8 reps - Supine Short Arc Quad  - 2-3 x daily - 1 sets - 6-8 reps - Seated Quad Set  - 2-3 x daily - 1 sets - 5 reps  ASSESSMENT:  CLINICAL IMPRESSION: 12/08/2023 Pt arrives w/o pain, reports improving tolerance to LAQ and isometrics at home with less pinching. Today continuing to progress quad strengthening for increased ROM and volume where tolerated. Also able to progress complexity of balance training. No increases in pain today, tolerates well without adverse event. Per discussion w/ pt, plan to assess goals and look at next steps with PT next session. Recommend continuing along current POC in order to address relevant deficits and improve functional tolerance. Pt departs today's session in no acute distress, all voiced questions/concerns addressed appropriately from PT perspective.    Per eval - Patient is a pleasant 63 y.o. gentleman who was seen today for physical therapy evaluation and treatment for balance/gait in context of L patellar fracture w/ ORIF March 2024. Pt denies formal restrictions at this point but states he may require additional surgery for non-union, goal of PT is to maximize quad strength and functional mobility. He states at present he notes the most difficulty with  stair navigation, incline/declines, uneven surfaces, and sit<>stand after prolonged sitting. Doesn't have much pain but does have occasional swelling and popping. On exam he demonstrates significant reduction in L quad strength although does have good ROM. 5xSTS is within fall risk category (12-14sec cutoff score). FGA score is at cutoff for fall risk (22/30), demonstrates mild instability with dynamic tasks but no overt LOB. At present recommend skilled PT to address relevant impairments with focus on quad stability and functional mobility. Pt tolerates exam/HEP well without adverse event or increase in resting pain. Pt departs today's session in  no acute distress, all voiced questions/concerns addressed appropriately from PT perspective.    OBJECTIVE IMPAIRMENTS: Abnormal gait, decreased activity tolerance, decreased balance, decreased endurance, difficulty walking, decreased strength, improper body mechanics, and pain.   ACTIVITY LIMITATIONS: carrying, lifting, squatting, stairs, transfers, and locomotion level  PARTICIPATION LIMITATIONS: meal prep, cleaning, laundry, and community activity  PERSONAL FACTORS: Age, Time since onset of injury/illness/exacerbation, and 3+ comorbidities: HTN, DM, patellar fracture L knee  are also affecting patient's functional outcome.   REHAB POTENTIAL: Fair given chronicity, surgical history, and comorbidities  CLINICAL DECISION MAKING: Stable/uncomplicated  EVALUATION COMPLEXITY: Low   GOALS:  SHORT TERM GOALS: Target date: 11/17/2023 Pt will demonstrate appropriate understanding and performance of initially prescribed HEP in order to facilitate improved independence with management of symptoms.  Baseline: HEP provided on eval 11/17/23: reports good HEP adherence Goal status: MET   2. Pt will be able to perform 5xSTS in </= 14sec in order to facilitate reduced fall risk and improved functional mobility.  Baseline: 16sec Goal status: ONGOING  LONG TERM  GOALS: Target date: 12/15/2023 Pt will meet predicted score or greater on FOTO in order to demonstrate improved perception of function due to symptoms.  Baseline: FOTO TBD Goal status: INITIAL  2.  Pt will report ability to navigate up to 1 flight of stairs w/ reciprocal pattern and less than 2pt increase in resting pain in order to facilitate improved community access. Baseline: reports reciprocal pattern and rail use Goal status: INITIAL  3.  Pt will score greater than or equal to 26/30 on Functional Gait assessment in order to indicate reduced fall risk (cutoff score </= 22/30 predictive of falls per Alvino Chapel et al 2010, MCID 4 pts Beninato et al 2014)  Baseline: 22/30 Goal status: INITIAL  4.  Pt will be able to perform 5xSTS in less than or equal to 12 in order to demonstrate reduced fall risk and improved functional independence (MCID 5xSTS = 2.3 sec). Baseline: 16sec Goal status: INITIAL   PLAN:  PT FREQUENCY: 1x/week  PT DURATION: 8 weeks  PLANNED INTERVENTIONS: 97164- PT Re-evaluation, 97110-Therapeutic exercises, 97530- Therapeutic activity, [id number]- Neuromuscular re-education, 97535- Self Care, 16109- Manual therapy, [phone]- Gait training, [phone]- Aquatic Therapy, Patient/Family education, Balance training, Stair training, Taping, Dry Needling, Joint mobilization, Scar mobilization, Cryotherapy, and Moist heat  PLAN FOR NEXT SESSION: Review/update HEP PRN. Working on quad stability/strength, balance. Symptom modification strategies as indicated/appropriate. Mindful of ORIF/fracture, recommend close monitoring of symptom response and swelling with activities, modifications as needed.    Ashley Murrain PT, DPT 12/08/2023 10:18 AM

## 2023-12-08 ENCOUNTER — Ambulatory Visit: Payer: 59 | Admitting: Physical Therapy

## 2023-12-08 ENCOUNTER — Encounter: Payer: Self-pay | Admitting: Physical Therapy

## 2023-12-08 DIAGNOSIS — L821 Other seborrheic keratosis: Secondary | ICD-10-CM | POA: Diagnosis not present

## 2023-12-08 DIAGNOSIS — L814 Other melanin hyperpigmentation: Secondary | ICD-10-CM | POA: Diagnosis not present

## 2023-12-08 DIAGNOSIS — Z85828 Personal history of other malignant neoplasm of skin: Secondary | ICD-10-CM | POA: Diagnosis not present

## 2023-12-08 DIAGNOSIS — M25562 Pain in left knee: Secondary | ICD-10-CM

## 2023-12-08 DIAGNOSIS — R2689 Other abnormalities of gait and mobility: Secondary | ICD-10-CM

## 2023-12-08 DIAGNOSIS — L918 Other hypertrophic disorders of the skin: Secondary | ICD-10-CM | POA: Diagnosis not present

## 2023-12-08 DIAGNOSIS — L570 Actinic keratosis: Secondary | ICD-10-CM | POA: Diagnosis not present

## 2023-12-14 NOTE — Therapy (Signed)
 OUTPATIENT PHYSICAL THERAPY PROGRESS NOTE + RECERTIFICATION ***    Patient Name: James Ware MRN: [id number] DOB:1961/07/29, 63 y.o., male Today's Date: 12/14/2023   Progress Note Reporting Period 10/20/23 to 12/15/23  See note below for Objective Data and Assessment of Progress/Goals.      END OF SESSION:           Past Medical History:  Diagnosis Date   Arthritis    left knee   Eczema 05/24/2017   Essential hypertension 05/24/2017   Hyperlipidemia    Prediabetes 05/24/2017   Venous stasis dermatitis of both lower extremities 05/24/2017   Past Surgical History:  Procedure Laterality Date   COLONOSCOPY     TONSILLECTOMY     in first grade   TYMPANOSTOMY TUBE PLACEMENT     as a child   WISDOM TOOTH EXTRACTION     Patient Active Problem List   Diagnosis Date Noted   Closed patellar sleeve fracture of left knee 11/30/2022   Primary osteoarthritis of left hip 12/05/2021   Well adult exam 08/17/2021   Actinic skin damage - scalp and forehead, cryotherapy applied 02/13/21 02/13/2021   Seasonal allergic rhinitis 05/22/2019   Erectile dysfunction 04/11/2019   Hearing loss 12/16/2017   HLD (hyperlipidemia) 05/27/2017   Hypertension associated with diabetes (HCC) 05/24/2017   Eczema 05/24/2017   Venous stasis dermatitis of both lower extremities 05/24/2017   Type 2 diabetes mellitus with other specified complication (HCC) 05/24/2017    PCP: Everrett Coombe, DO  REFERRING PROVIDER: Vale Haven, MD  REFERRING DIAG: Displaced comminuted fracture of left patella, initial encounter for closed fracture (A63.016W) ;    THERAPY DIAG:  No diagnosis found.  Rationale for Evaluation and Treatment: Rehabilitation  ONSET DATE: March 2024  SUBJECTIVE:   Per eval - Pt describes himself as historically quite active - enjoys jogging, golfing, playing tennis. States in March of 2024 he had a fall on a sidewalk and fractured his patella, requiring ORIF. States he  was receiving PT, everything going well overall until around August, when follow up imaging showed non-union of fracture. Pt states he continued to improve with PT and fracture healing was monitored with follow up imaging, most recent a couple weeks ago. He endorses that imaging has not been decisive re: fracture healing and he may end up having another surgery. He states his surgeon would like for him to return to PT to maximize quad strength and improve balance (ran out of insurance visits last year). He states he doesn't really have much pain overall, primary issue is a "hitch" or "catch" at times, will also have swelling if he overdoes it. States with prior bout of PT he was working on balance, resistance bands, closed chain movements, bike, and mobility work. He denies any formal restrictions at this point.   SUBJECTIVE STATEMENT: 12/14/2023 ***  *** states he had a few days of increased stiffness but no extra pain, no issues after last session. HEP is going well, starting to feel less tension with isometrics, a little bit of increased ROM with LAQ. No other new updates   PERTINENT HISTORY: HTN, DM, patellar fracture L knee March 2024 w/ ORIF  PAIN:  Are you having pain: no pain at present *** Worst: ***   Per eval -  Location/description: R knee Best-worst over past week: 0-5/10  - aggravating factors: leading w L leg up stairs, STS and first few steps, uneven surfaces, walking down decline - Easing factors: rest, medication  PRECAUTIONS: pt  denies any specific restrictions Per paper referral special instructions: "balance/gait, quadriceps strengthening, knee stabilization exercises LLE"  WEIGHT BEARING RESTRICTIONS: No  FALLS:  Has patient fallen in last 6 months? No  LIVING ENVIRONMENT: Has a roommate/partner ; 1STE, level home  OCCUPATION: was an Catering manager for [employer], currently looking for work with history consulting  PLOF: Independent  PATIENT GOALS: get back to  playing tennis, walking more. Enjoys being active  NEXT MD VISIT: mid April   OBJECTIVE:  Note: Objective measures were completed at Evaluation unless otherwise noted.  DIAGNOSTIC FINDINGS:  No recent imaging in chart - pt states he has had follow up imaging since surgery, most recent a couple of weeks ago. States he will bring in radiology impression L patellar ORIF 12/02/22   PATIENT SURVEYS:  FOTO deferred at eval - not yet set up in system  COGNITION: Overall cognitive status: Within functional limits for tasks assessed     SENSATION: Does not endorse any sensory complaints  EDEMA:  Endorses fluctuating edema about knee - not significant on observation today   LOWER EXTREMITY ROM:     Active  Right eval Left eval  Hip flexion    Hip extension    Hip internal rotation    Hip external rotation    Knee extension full Full   Knee flexion  129 painless   (Blank rows = not tested) (Key: WFL = within functional limits not formally assessed, * = concordant pain, s = stiffness/stretching sensation, NT = not tested)  Comments:    LOWER EXTREMITY MMT:    MMT Right eval Left eval Left 12/14/23 ***   Hip flexion     Hip abduction (modified sitting)     Hip internal rotation     Hip external rotation     Knee flexion 5 3+ (mild pinch, no overt pain)   Knee extension 5 5   Ankle dorsiflexion      (Blank rows = not tested) (Key: WFL = within functional limits not formally assessed, * = concordant pain, s = stiffness/stretching sensation, NT = not tested)  Comments: with L knee ext MMT - minimal PT resistance applied, gradual build  LOWER EXTREMITY SPECIAL TESTS:  SLR without lag or pain, 10 reps equal BIL  FUNCTIONAL TESTS:  5xSTS: 16.26sec no UE support, mild popping but no pain  12/14/23: 5xSTS ***   GAIT: Distance walked: within clinic Assistive device utilized: None Level of assistance: Complete Independence Comments: mildly reduced stance time LLE, occasional  hyperextension L knee in stance   FGA on eval:  -Item 1 Gait Level Surface: mild impairment 2  -Item 2 Change in Gait Speed: mild impairment 2  -Item 3 Gait with Horizontal Head Turns: mild impairment 2  -Item 4 Gait with Vertical Head Turns: Normal 3  -Item 5 Gait with Pivot Turn: Normal 3  -Item 6 Step Over Obstacle: mild impairment 2  -Item 7 Gait with Narrow Base of Support: Normal 3  -Item 8 Gait with Eyes Closed: mild impairment 2             -Item 9 Ambulating Backwards: mild impairment 2  -Item 10 Steps: moderate impairment 1  (pt self report) Total: 22 /30  * Score of <=22/30 indicates that patient is at increased risk for falls.    FGA 12/14/23:  -Item 1 Gait Level Surface: Normal 3 / mild impairment 2 / moderate impairment 1  / severe impairment 0 -Item 2 Change in Gait Speed: Normal  3 / mild impairment 2 / moderate impairment 1  / severe impairment 0 -Item 3 Gait with Horizontal Head Turns: Normal 3 / mild impairment 2 / moderate impairment 1  / severe impairment 0 -Item 4 Gait with Vertical Head Turns: Normal 3 / mild impairment 2 / moderate impairment 1  / severe impairment 0 -Item 5 Gait with Pivot Turn: Normal 3 / mild impairment 2 / moderate impairment 1  / severe impairment 0 -Item 6 Step Over Obstacle: Normal 3 / mild impairment 2 / moderate impairment 1  / severe impairment 0 -Item 7 Gait with Narrow Base of Support: Normal 3 / mild impairment 2 / moderate impairment 1  / severe impairment 0 -Item 8 Gait with Eyes Closed: Normal 3 / mild impairment 2 / moderate impairment 1  / severe impairment 0            -Item 9 Ambulating Backwards: Normal 3 / mild impairment 2 / moderate impairment 1  / severe impairment 0 -Item 10 Steps: Normal 3 / mild impairment 2 / moderate impairment 1  / severe impairment 0 Total: *** /30  * Score of <=22/30 indicates that patient is at increased risk for falls.                                                                                                                                  TREATMENT DATE:  [facility] Adult PT Treatment:                                                DATE: 12/15/23 Therapeutic Exercise: *** Manual Therapy: *** Neuromuscular re-ed: *** Therapeutic Activity: *** Modalities: *** Self Care: ***    [facility] Adult PT Treatment:                                                DATE: 12/08/23 Therapeutic Exercise: SAQ on raised bolster 2x12 cues for reduced compensations  Sidelying hip abduction LLE 2x8 cues for positioning HEP discussion/education   Neuromuscular re-ed: Knee ext iso into physio ball 2x8 w/ 3sec hold Airex balance beam tandem walk 4 laps weaning UE support CGA Treadmill at 5 incline, belt not moving, working on incline/decline balance 5 laps Fwd walking on mat w/ dual tasking for compliant surfaces 5 laps  Lateral walking on mat 3 laps with cognitive dual tasking    [facility] Adult PT Treatment:  DATE: 12/01/23 Therapeutic Exercise: SLR (IR, ER, neutral) x8 each cues for quad contraction  Quad isometric at wall w/ towel, 2x8 (~60 deg) cues for comfortable force output, positioning  SAQ on foam roll for inc ROM 2x10 cues for quad contraction HEP update + education/handout  Neuromuscular re-ed: Dynadisc kickstand step up w/ UE support 2x8 cues for eccentric quad control  1 lap tandem walk at counter SBA  3 laps tandem walk on airex beam CGA cues for posture and foot placement Stop/go reactive balance 3 laps cues for quick/soft movements    [facility] Adult PT Treatment:                                                DATE: 11/24/23 Therapeutic Exercise: Unresisted LAQ 3x10 within comfortable ROM (~50deg) Knee extension isometric (~60deg) 2x5 w/ 5sec hold, submax and gradual build within tolerance LLE Mini squat x10 emphasis on comfortable ROM and symmetrical WB  HEP discussion/education   Neuromuscular re-ed: Fwd step up x15 w/ LLE onto  airex, cues for trunk positioning and quad control Airex lateral step up x15 LLE cues for foot positioning, quad control, posture With both versions of step ups, frequent rest and time taken throughout to educate/cue on different mechanics and checking in on symptoms  Self Care: Continued education/discussion re: monitoring symptoms, appropriate activity modification, self progression/regression of activity based on symptom response   PATIENT EDUCATION:  Education details: rationale for interventions, HEP  Person educated: Patient Education method: Explanation, Demonstration, Tactile cues, Verbal cues Education comprehension: verbalized understanding, returned demonstration, verbal cues required, tactile cues required, and needs further education     HOME EXERCISE PROGRAM: Access Code: [email or web address] URL: [email or web address] Date: 12/01/2023 Prepared by: Fransisco Hertz  Program Notes - with quad set, please do with gentle push into wall, knee around 60 deg as done in clinic  Exercises - Active Straight Leg Raise with Quad Set  - 2-3 x daily - 1 sets - 12 reps - Side Stepping with Resistance at Thighs and Counter Support  - 2-3 x daily - 1 sets - 10 reps - Heel Toe Raises with Counter Support  - 2-3 x daily - 1 sets - 8 reps - Mini Squat with Counter Support  - 2-3 x daily - 1 sets - 5-8 reps - Supine Short Arc Quad  - 2-3 x daily - 1 sets - 6-8 reps - Seated Quad Set  - 2-3 x daily - 1 sets - 5 reps  ASSESSMENT:  CLINICAL IMPRESSION: 12/14/2023 ***  *** Pt arrives w/o pain, reports improving tolerance to LAQ and isometrics at home with less pinching. Today continuing to progress quad strengthening for increased ROM and volume where tolerated. Also able to progress complexity of balance training. No increases in pain today, tolerates well without adverse event. Per discussion w/ pt, plan to assess goals and look at next steps with PT next session. Recommend continuing along  current POC in order to address relevant deficits and improve functional tolerance. Pt departs today's session in no acute distress, all voiced questions/concerns addressed appropriately from PT perspective.    Per eval - Patient is a pleasant 63 y.o. gentleman who was seen today for physical therapy evaluation and treatment for balance/gait in context of L patellar fracture w/ ORIF March 2024. Pt denies formal restrictions at this point but states he may  require additional surgery for non-union, goal of PT is to maximize quad strength and functional mobility. He states at present he notes the most difficulty with stair navigation, incline/declines, uneven surfaces, and sit<>stand after prolonged sitting. Doesn't have much pain but does have occasional swelling and popping. On exam he demonstrates significant reduction in L quad strength although does have good ROM. 5xSTS is within fall risk category (12-14sec cutoff score). FGA score is at cutoff for fall risk (22/30), demonstrates mild instability with dynamic tasks but no overt LOB. At present recommend skilled PT to address relevant impairments with focus on quad stability and functional mobility. Pt tolerates exam/HEP well without adverse event or increase in resting pain. Pt departs today's session in no acute distress, all voiced questions/concerns addressed appropriately from PT perspective.    OBJECTIVE IMPAIRMENTS: Abnormal gait, decreased activity tolerance, decreased balance, decreased endurance, difficulty walking, decreased strength, improper body mechanics, and pain.   ACTIVITY LIMITATIONS: carrying, lifting, squatting, stairs, transfers, and locomotion level  PARTICIPATION LIMITATIONS: meal prep, cleaning, laundry, and community activity  PERSONAL FACTORS: Age, Time since onset of injury/illness/exacerbation, and 3+ comorbidities: HTN, DM, patellar fracture L knee  are also affecting patient's functional outcome.   REHAB POTENTIAL: Fair  given chronicity, surgical history, and comorbidities  CLINICAL DECISION MAKING: Stable/uncomplicated  EVALUATION COMPLEXITY: Low   GOALS:  SHORT TERM GOALS: Target date: 11/17/2023 Pt will demonstrate appropriate understanding and performance of initially prescribed HEP in order to facilitate improved independence with management of symptoms.  Baseline: HEP provided on eval 11/17/23: reports good HEP adherence Goal status: MET   2. Pt will be able to perform 5xSTS in </= 14sec in order to facilitate reduced fall risk and improved functional mobility.  Baseline: 16sec 12/15/23: *** Goal status: ***  LONG TERM GOALS: Target date: 12/15/2023 Pt will meet predicted score or greater on FOTO in order to demonstrate improved perception of function due to symptoms.  Baseline: FOTO TBD 12/15/23: *** Goal status: ***  2.  Pt will report ability to navigate up to 1 flight of stairs w/ reciprocal pattern and less than 2pt increase in resting pain in order to facilitate improved community access. Baseline: reports reciprocal pattern and rail use 12/15/23: *** Goal status: ***  3.  Pt will score greater than or equal to 26/30 on Functional Gait assessment in order to indicate reduced fall risk (cutoff score </= 22/30 predictive of falls per Alvino Chapel et al 2010, MCID 4 pts Beninato et al 2014)  Baseline: 22/30 12/15/23: *** Goal status: ***  4.  Pt will be able to perform 5xSTS in less than or equal to 12 in order to demonstrate reduced fall risk and improved functional independence (MCID 5xSTS = 2.3 sec). Baseline: 16sec 12/15/23: *** Goal status: ***   PLAN:  PT FREQUENCY: 1x/week  PT DURATION: 8 weeks  PLANNED INTERVENTIONS: 97164- PT Re-evaluation, 97110-Therapeutic exercises, 97530- Therapeutic activity, [id number]- Neuromuscular re-education, 97535- Self Care, [id number]- Manual therapy, [phone]- Gait training, [phone]- Aquatic Therapy, Patient/Family education, Balance training, Stair training,  Taping, Dry Needling, Joint mobilization, Scar mobilization, Cryotherapy, and Moist heat  PLAN FOR NEXT SESSION: Review/update HEP PRN. Working on quad stability/strength, balance. Symptom modification strategies as indicated/appropriate. Mindful of ORIF/fracture, recommend close monitoring of symptom response and swelling with activities, modifications as needed.    Ashley Murrain PT, DPT 12/14/2023 9:01 AM

## 2023-12-15 ENCOUNTER — Encounter: Payer: Self-pay | Admitting: Physical Therapy

## 2023-12-15 ENCOUNTER — Ambulatory Visit: Payer: 59 | Admitting: Physical Therapy

## 2023-12-15 DIAGNOSIS — R2689 Other abnormalities of gait and mobility: Secondary | ICD-10-CM | POA: Diagnosis not present

## 2023-12-15 DIAGNOSIS — M25562 Pain in left knee: Secondary | ICD-10-CM | POA: Diagnosis not present

## 2023-12-28 NOTE — Therapy (Signed)
 OUTPATIENT PHYSICAL THERAPY TREATMENT   Patient Name: Caydn Justen MRN: [id number] DOB:May 12, 1961, 63 y.o., male Today's Date: 12/29/2023    END OF SESSION:  PT End of Session - 12/29/23 1019     Visit Number 10    Number of Visits 11    Date for PT Re-Evaluation 01/12/24    Authorization Type aetna    Authorization Time Period 30VL no auth required    Authorization - Visit Number 10    Authorization - Number of Visits 30    PT Start Time 1019    PT Stop Time 1101    PT Time Calculation (min) 42 min    Activity Tolerance Patient tolerated treatment well               Past Medical History:  Diagnosis Date   Arthritis    left knee   Eczema 05/24/2017   Essential hypertension 05/24/2017   Hyperlipidemia    Prediabetes 05/24/2017   Venous stasis dermatitis of both lower extremities 05/24/2017   Past Surgical History:  Procedure Laterality Date   COLONOSCOPY     TONSILLECTOMY     in first grade   TYMPANOSTOMY TUBE PLACEMENT     as a child   WISDOM TOOTH EXTRACTION     Patient Active Problem List   Diagnosis Date Noted   Closed patellar sleeve fracture of left knee 11/30/2022   Primary osteoarthritis of left hip 12/05/2021   Well adult exam 08/17/2021   Actinic skin damage - scalp and forehead, cryotherapy applied 02/13/21 02/13/2021   Seasonal allergic rhinitis 05/22/2019   Erectile dysfunction 04/11/2019   Hearing loss 12/16/2017   HLD (hyperlipidemia) 05/27/2017   Hypertension associated with diabetes (HCC) 05/24/2017   Eczema 05/24/2017   Venous stasis dermatitis of both lower extremities 05/24/2017   Type 2 diabetes mellitus with other specified complication (HCC) 05/24/2017    PCP: Everrett Coombe, DO  REFERRING PROVIDER: Vale Haven, MD  REFERRING DIAG: Displaced comminuted fracture of left patella, initial encounter for closed fracture (W09.811B) ;    THERAPY DIAG:  Left knee pain, unspecified chronicity  Other abnormalities of gait  and mobility  Rationale for Evaluation and Treatment: Rehabilitation  ONSET DATE: March 2024  SUBJECTIVE:   Per eval - Pt describes himself as historically quite active - enjoys jogging, golfing, playing tennis. States in March of 2024 he had a fall on a sidewalk and fractured his patella, requiring ORIF. States he was receiving PT, everything going well overall until around August, when follow up imaging showed non-union of fracture. Pt states he continued to improve with PT and fracture healing was monitored with follow up imaging, most recent a couple weeks ago. He endorses that imaging has not been decisive re: fracture healing and he may end up having another surgery. He states his surgeon would like for him to return to PT to maximize quad strength and improve balance (ran out of insurance visits last year). He states he doesn't really have much pain overall, primary issue is a "hitch" or "catch" at times, will also have swelling if he overdoes it. States with prior bout of PT he was working on balance, resistance bands, closed chain movements, bike, and mobility work. He denies any formal restrictions at this point.   SUBJECTIVE STATEMENT: 12/29/2023 Pt states he has been doing well since last visit, hasn't been quite as busy around the house but has done some yardwork. Pt states he feels he can push harder with isometrics  but feels the pain with a full arc of movement is about the same. Does feel a lot of activities are producing less pain/tension   PERTINENT HISTORY: HTN, DM, patellar fracture L knee March 2024 w/ ORIF  PAIN:  Are you having pain: no pain at present  Worst: 4/10 once or twice, usually no pain   Per eval -  Location/description: R knee Best-worst over past week: 0-5/10  - aggravating factors: leading w L leg up stairs, STS and first few steps, uneven surfaces, walking down decline - Easing factors: rest, medication  PRECAUTIONS: pt denies any specific  restrictions Per paper referral special instructions: "balance/gait, quadriceps strengthening, knee stabilization exercises LLE"  WEIGHT BEARING RESTRICTIONS: No  FALLS:  Has patient fallen in last 6 months? No  LIVING ENVIRONMENT: Has a roommate/partner ; 1STE, level home  OCCUPATION: was an Catering manager for [employer], currently looking for work with history consulting  PLOF: Independent  PATIENT GOALS: get back to playing tennis, walking more. Enjoys being active  NEXT MD VISIT: mid April   OBJECTIVE:  Note: Objective measures were completed at Evaluation unless otherwise noted.  DIAGNOSTIC FINDINGS:  No recent imaging in chart - pt states he has had follow up imaging since surgery, most recent a couple of weeks ago. States he will bring in radiology impression L patellar ORIF 12/02/22   PATIENT SURVEYS:  FOTO deferred at eval - not yet set up in system  COGNITION: Overall cognitive status: Within functional limits for tasks assessed     SENSATION: Does not endorse any sensory complaints  EDEMA:  Endorses fluctuating edema about knee - not significant on observation today 12/15/23: pt denies any significant fluctuations in swelling recently   LOWER EXTREMITY ROM:     Active  Right eval Left eval Left 12/15/23  Hip flexion     Hip extension     Hip internal rotation     Hip external rotation     Knee extension full Full    Knee flexion  129 painless  132 deg painless  (Blank rows = not tested) (Key: WFL = within functional limits not formally assessed, * = concordant pain, s = stiffness/stretching sensation, NT = not tested)  Comments:    LOWER EXTREMITY MMT:    MMT Right eval Left Eval (Noted afterwards that extension and flexion entered opposite in error) Left 12/15/23   R/L 12/15/23 with HHD R/L 12/29/23 with HHD  Hip flexion       Hip abduction (modified sitting)       Hip internal rotation       Hip external rotation       Knee flexion 5 3+ (mild pinch, no  overt pain) 5    Knee extension 5 5 4- (pinch) 25.1 / 11.2 32.4 / 18.5 (transient pain)  Ankle dorsiflexion        (Blank rows = not tested) (Key: WFL = within functional limits not formally assessed, * = concordant pain, s = stiffness/stretching sensation, NT = not tested)  Comments: with L knee ext MMT - minimal PT resistance applied, gradual build  LOWER EXTREMITY SPECIAL TESTS:  SLR without lag or pain, 10 reps equal BIL  FUNCTIONAL TESTS:  5xSTS: 16.26sec no UE support, mild popping but no pain  12/14/23: 5xSTS 19.72 sec no UE support, some tension but no pain, mild difficulty w/ forward weight shift and shifting ; second bout 14.03sec no difference in symptoms   GAIT: Distance walked: within clinic Assistive device  utilized: None Level of assistance: Complete Independence Comments: mildly reduced stance time LLE, occasional hyperextension L knee in stance   FGA on eval:  -Item 1 Gait Level Surface: mild impairment 2  -Item 2 Change in Gait Speed: mild impairment 2  -Item 3 Gait with Horizontal Head Turns: mild impairment 2  -Item 4 Gait with Vertical Head Turns: Normal 3  -Item 5 Gait with Pivot Turn: Normal 3  -Item 6 Step Over Obstacle: mild impairment 2  -Item 7 Gait with Narrow Base of Support: Normal 3  -Item 8 Gait with Eyes Closed: mild impairment 2             -Item 9 Ambulating Backwards: mild impairment 2  -Item 10 Steps: moderate impairment 1  (pt self report) Total: 22 /30  * Score of <=22/30 indicates that patient is at increased risk for falls.    FGA 12/14/23:  -Item 1 Gait Level Surface: mild impairment 2  -Item 2 Change in Gait Speed: Normal 3  -Item 3 Gait with Horizontal Head Turns: Normal 3  -Item 4 Gait with Vertical Head Turns: Normal 3  -Item 5 Gait with Pivot Turn: Normal 3  -Item 6 Step Over Obstacle: Normal 3 -Item 7 Gait with Narrow Base of Support: Normal 3 -Item 8 Gait with Eyes Closed: Normal 3             -Item 9 Ambulating  Backwards: Normal 3  -Item 10 Steps: moderate impairment 1 Total: 27 /30  * Score of <=22/30 indicates that patient is at increased risk for falls.                                                                                                                                 TREATMENT DATE:  [facility] Adult PT Treatment:                                                DATE: 12/29/23  Neuromuscular re-ed: Seated LAQ AAROM on eccentric portion for quad control 2x10 4 inch fwd step up x10 emphasisonquad control 4inch lateral step up x10 quad control 4inch lateral stepover x8 BIL  Self Care: Continued education on activity/exercise modification based on symptom response, HHD readings as they pertain to functional impairments, walking/gardening activities outside of PT   [facility] Adult PT Treatment:                                                DATE: 12/15/23 Therapeutic Activity: 5xSTS + education FGA + education Education/discussion re: progress with PT, symptom behavior as it affects activity tolerance, PT goals/POC, following up with provider, activity modification/progression as appropriate w/ response  to symptom behavior, principles of exercise and relevant anatomy/physiology as pertains to his symptom behavior    [facility] Adult PT Treatment:                                                DATE: 12/08/23 Therapeutic Exercise: SAQ on raised bolster 2x12 cues for reduced compensations  Sidelying hip abduction LLE 2x8 cues for positioning HEP discussion/education   Neuromuscular re-ed: Knee ext iso into physio ball 2x8 w/ 3sec hold Airex balance beam tandem walk 4 laps weaning UE support CGA Treadmill at 5 incline, belt not moving, working on incline/decline balance 5 laps Fwd walking on mat w/ dual tasking for compliant surfaces 5 laps  Lateral walking on mat 3 laps with cognitive dual tasking    [facility] Adult PT Treatment:                                                DATE:  12/01/23 Therapeutic Exercise: SLR (IR, ER, neutral) x8 each cues for quad contraction  Quad isometric at wall w/ towel, 2x8 (~60 deg) cues for comfortable force output, positioning  SAQ on foam roll for inc ROM 2x10 cues for quad contraction HEP update + education/handout  Neuromuscular re-ed: Dynadisc kickstand step up w/ UE support 2x8 cues for eccentric quad control  1 lap tandem walk at counter SBA  3 laps tandem walk on airex beam CGA cues for posture and foot placement Stop/go reactive balance 3 laps cues for quick/soft movements   PATIENT EDUCATION:  Education details: rationale for interventions, HEP  Person educated: Patient Education method: Explanation, Demonstration, Tactile cues, Verbal cues Education comprehension: verbalized understanding, returned demonstration, verbal cues required, tactile cues required, and needs further education     HOME EXERCISE PROGRAM: Access Code: [email or web address] URL: [email or web address] Date: 12/29/2023 Prepared by: Fransisco Hertz  Program Notes - with quad set, please do with gentle push into wall, knee around 60 deg as done in clinic  Exercises - Active Straight Leg Raise with Quad Set  - 2-3 x daily - 1 sets - 12 reps - Mini Squat with Counter Support  - 2-3 x daily - 1 sets - 5-8 reps - Supine Short Arc Quad  - 2-3 x daily - 1 sets - 6-8 reps - Seated Quad Set  - 2-3 x daily - 1 sets - 5 reps - Seated Knee Extension AAROM  - 2-3 x daily - 1 sets - 8-10 reps  ASSESSMENT:  CLINICAL IMPRESSION: 12/29/2023 Pt arrives w/o pain, reports some improvement in isometrics which is supported by increased output via HHD today. Much of today is spent working on improving comfort with full arc of unresisted movement - improved tolerance is noted with concentric portion, moderate pain with eccentric portion but this is eliminated with contralateral LE assist. HEP updated accordingly with continued education on close monitoring and  modification PRN. Also continuing to progress step training with emphasis on quad control. No adverse events, no pain on departure. Recommend continuing along current POC in order to address relevant deficits and improve functional tolerance. Pt departs today's session in no acute distress, all voiced questions/concerns addressed appropriately from PT perspective.    Per eval - Patient is a pleasant  63 y.o. gentleman who was seen today for physical therapy evaluation and treatment for balance/gait in context of L patellar fracture w/ ORIF March 2024. Pt denies formal restrictions at this point but states he may require additional surgery for non-union, goal of PT is to maximize quad strength and functional mobility. He states at present he notes the most difficulty with stair navigation, incline/declines, uneven surfaces, and sit<>stand after prolonged sitting. Doesn't have much pain but does have occasional swelling and popping. On exam he demonstrates significant reduction in L quad strength although does have good ROM. 5xSTS is within fall risk category (12-14sec cutoff score). FGA score is at cutoff for fall risk (22/30), demonstrates mild instability with dynamic tasks but no overt LOB. At present recommend skilled PT to address relevant impairments with focus on quad stability and functional mobility. Pt tolerates exam/HEP well without adverse event or increase in resting pain. Pt departs today's session in no acute distress, all voiced questions/concerns addressed appropriately from PT perspective.    OBJECTIVE IMPAIRMENTS: Abnormal gait, decreased activity tolerance, decreased balance, decreased endurance, difficulty walking, decreased strength, improper body mechanics, and pain.   ACTIVITY LIMITATIONS: carrying, lifting, squatting, stairs, transfers, and locomotion level  PARTICIPATION LIMITATIONS: meal prep, cleaning, laundry, and community activity  PERSONAL FACTORS: Age, Time since onset of  injury/illness/exacerbation, and 3+ comorbidities: HTN, DM, patellar fracture L knee  are also affecting patient's functional outcome.   REHAB POTENTIAL: Fair given chronicity, surgical history, and comorbidities  CLINICAL DECISION MAKING: Stable/uncomplicated  EVALUATION COMPLEXITY: Low   GOALS:  SHORT TERM GOALS: Target date: 11/17/2023 Pt will demonstrate appropriate understanding and performance of initially prescribed HEP in order to facilitate improved independence with management of symptoms.  Baseline: HEP provided on eval 11/17/23: reports good HEP adherence Goal status: MET   2. Pt will be able to perform 5xSTS in </= 14sec in order to facilitate reduced fall risk and improved functional mobility.  Baseline: 16sec 12/15/23: 14 sec Goal status: MET  LONG TERM GOALS: Target date: 01/12/2024 (updated 12/15/23) Pt will meet predicted score or greater on FOTO in order to demonstrate improved perception of function due to symptoms.  Baseline: FOTO TBD 12/15/23: Not set up Goal status: DEFERRED  2.  Pt will report ability to navigate up to 1 flight of stairs w/ reciprocal pattern and less than 2pt increase in resting pain in order to facilitate improved community access. Baseline: reports reciprocal pattern and rail use 12/15/23: step to descending, rail up/down Goal status: PARTIALLY MET ; PROGRESSING  3.  Pt will score greater than or equal to 26/30 on Functional Gait assessment in order to indicate reduced fall risk (cutoff score </= 22/30 predictive of falls per Alvino Chapel et al 2010, MCID 4 pts Beninato et al 2014)  Baseline: 22/30 12/15/23: 27/30 Goal status: MET  4.  Pt will be able to perform 5xSTS in less than or equal to 12 in order to demonstrate reduced fall risk and improved functional independence (MCID 5xSTS = 2.3 sec). Baseline: 16sec 12/15/23: 14 sec Goal status: PROGRESSING  5. Pt will demonstrate knee extension force output on LLE at least 70% of contralateral  limb to demonstrate improved quad strength for functional activities.  Baseline: 11.2/25.1 = 45%  Goal status: INITIAL ; NEW 12/15/23   PLAN: (UPDATED 12/15/23)  PT FREQUENCY: every other week   PT DURATION: 4 weeks  PLANNED INTERVENTIONS: 97164- PT Re-evaluation, 97110-Therapeutic exercises, 97530- Therapeutic activity, 97112- Neuromuscular re-education, 97535- Self Care, 16109- Manual therapy, 60454- Gait  training, 16109- Aquatic Therapy, Patient/Family education, Balance training, Stair training, Taping, Dry Needling, Joint mobilization, Scar mobilization, Cryotherapy, and Moist heat  PLAN FOR NEXT SESSION: Review/update HEP PRN. Working on quad stability/strength, balance. Symptom modification strategies as indicated/appropriate. Mindful of ORIF/fracture, recommend close monitoring of symptom response and swelling with activities, modifications as needed.    Ashley Murrain PT, DPT 12/29/2023 12:08 PM

## 2023-12-29 ENCOUNTER — Ambulatory Visit: Attending: Orthopedic Surgery | Admitting: Physical Therapy

## 2023-12-29 ENCOUNTER — Encounter: Payer: Self-pay | Admitting: Physical Therapy

## 2023-12-29 DIAGNOSIS — M25562 Pain in left knee: Secondary | ICD-10-CM | POA: Insufficient documentation

## 2023-12-29 DIAGNOSIS — R2689 Other abnormalities of gait and mobility: Secondary | ICD-10-CM | POA: Insufficient documentation

## 2024-01-12 ENCOUNTER — Ambulatory Visit: Admitting: Physical Therapy

## 2024-01-12 ENCOUNTER — Encounter: Payer: Self-pay | Admitting: Physical Therapy

## 2024-01-12 DIAGNOSIS — M25562 Pain in left knee: Secondary | ICD-10-CM | POA: Diagnosis not present

## 2024-01-12 DIAGNOSIS — R2689 Other abnormalities of gait and mobility: Secondary | ICD-10-CM | POA: Diagnosis not present

## 2024-01-12 NOTE — Therapy (Signed)
 OUTPATIENT PHYSICAL THERAPY TREATMENT + DISCHARGE SUMMARY   Patient Name: James Ware MRN: [id number] DOB:1961-06-13, 63 y.o., male Today's Date: 01/12/2024   PHYSICAL THERAPY DISCHARGE SUMMARY  Visits from Start of Care: 11  Current functional level related to goals / functional outcomes: Able to perform majority of usual mobility/ADLs with minimal increases in pain   Remaining deficits: Pain, reduced quad strength, altered kinematics     Education / Equipment: HEP, discharge education, follow up with provider    Patient agrees to discharge. Patient goals were  partially met . Patient is being discharged due to  further physician follow up.     END OF SESSION:  PT End of Session - 01/12/24 0917     Visit Number 11    Number of Visits 11    Date for PT Re-Evaluation 01/12/24    Authorization Type aetna    Authorization Time Period 30VL no auth required    Authorization - Visit Number 11    Authorization - Number of Visits 30    PT Start Time 0919    PT Stop Time 0947    PT Time Calculation (min) 28 min    Activity Tolerance Patient tolerated treatment well                Past Medical History:  Diagnosis Date   Arthritis    left knee   Eczema 05/24/2017   Essential hypertension 05/24/2017   Hyperlipidemia    Prediabetes 05/24/2017   Venous stasis dermatitis of both lower extremities 05/24/2017   Past Surgical History:  Procedure Laterality Date   COLONOSCOPY     TONSILLECTOMY     in first grade   TYMPANOSTOMY TUBE PLACEMENT     as a child   WISDOM TOOTH EXTRACTION     Patient Active Problem List   Diagnosis Date Noted   Closed patellar sleeve fracture of left knee 11/30/2022   Primary osteoarthritis of left hip 12/05/2021   Well adult exam 08/17/2021   Actinic skin damage - scalp and forehead, cryotherapy applied 02/13/21 02/13/2021   Seasonal allergic rhinitis 05/22/2019   Erectile dysfunction 04/11/2019   Hearing loss 12/16/2017   HLD  (hyperlipidemia) 05/27/2017   Hypertension associated with diabetes (HCC) 05/24/2017   Eczema 05/24/2017   Venous stasis dermatitis of both lower extremities 05/24/2017   Type 2 diabetes mellitus with other specified complication (HCC) 05/24/2017    PCP: Adela Holter, DO  REFERRING PROVIDER: Clarinda Crome, MD  REFERRING DIAG: Displaced comminuted fracture of left patella, initial encounter for closed fracture (Y86.578I) ;    THERAPY DIAG:  Left knee pain, unspecified chronicity  Other abnormalities of gait and mobility  Rationale for Evaluation and Treatment: Rehabilitation  ONSET DATE: March 2024  SUBJECTIVE:   Per eval - Pt describes himself as historically quite active - enjoys jogging, golfing, playing tennis. States in March of 2024 he had a fall on a sidewalk and fractured his patella, requiring ORIF. States he was receiving PT, everything going well overall until around August, when follow up imaging showed non-union of fracture. Pt states he continued to improve with PT and fracture healing was monitored with follow up imaging, most recent a couple weeks ago. He endorses that imaging has not been decisive re: fracture healing and he may end up having another surgery. He states his surgeon would like for him to return to PT to maximize quad strength and improve balance (ran out of insurance visits last year). He states he  doesn't really have much pain overall, primary issue is a "hitch" or "catch" at times, will also have swelling if he overdoes it. States with prior bout of PT he was working on balance, resistance bands, closed chain movements, bike, and mobility work. He denies any formal restrictions at this point.   SUBJECTIVE STATEMENT: 01/12/2024 did a lot of walking with travelling, no increase in swelling or pain. Notices his arc of movement has improved greatly, no longer getting as much catching. Looking forward to seeing surgeon tomorrow to discuss next steps and  look at integrity of hardware. Still having mild pain with reciprocal stair pattern or when leading with LLE but otherwise balance and mobility much improved. No pain at present.    PERTINENT HISTORY: HTN, DM, patellar fracture L knee March 2024 w/ ORIF  PAIN:  Are you having pain: no pain at present  Worst: 3/10 once or twice, usually no pain   Per eval -  Location/description: R knee Best-worst over past week: 0-5/10  - aggravating factors: leading w L leg up stairs, STS and first few steps, uneven surfaces, walking down decline - Easing factors: rest, medication  PRECAUTIONS: pt denies any specific restrictions Per paper referral special instructions: "balance/gait, quadriceps strengthening, knee stabilization exercises LLE"  WEIGHT BEARING RESTRICTIONS: No  FALLS:  Has patient fallen in last 6 months? No  LIVING ENVIRONMENT: Has a roommate/partner ; 1STE, level home  OCCUPATION: was an Catering manager for [employer], currently looking for work with history consulting  PLOF: Independent  PATIENT GOALS: get back to playing tennis, walking more. Enjoys being active  NEXT MD VISIT: mid April   OBJECTIVE:  Note: Objective measures were completed at Evaluation unless otherwise noted.  DIAGNOSTIC FINDINGS:  No recent imaging in chart - pt states he has had follow up imaging since surgery, most recent a couple of weeks ago. States he will bring in radiology impression L patellar ORIF 12/02/22   PATIENT SURVEYS:  FOTO deferred at eval - not yet set up in system  COGNITION: Overall cognitive status: Within functional limits for tasks assessed     SENSATION: Does not endorse any sensory complaints  EDEMA:  Endorses fluctuating edema about knee - not significant on observation today 12/15/23: pt denies any significant fluctuations in swelling recently 01/12/24: continues to deny significant issues with swelling   LOWER EXTREMITY ROM:     Active  Right eval Left eval Left  12/15/23  Hip flexion     Hip extension     Hip internal rotation     Hip external rotation     Knee extension full Full    Knee flexion  129 painless  132 deg painless  (Blank rows = not tested) (Key: WFL = within functional limits not formally assessed, * = concordant pain, s = stiffness/stretching sensation, NT = not tested)  Comments:    LOWER EXTREMITY MMT:    MMT Right eval Left Eval (Noted afterwards that extension and flexion entered opposite in error) Left 12/15/23   R/L 12/15/23 with HHD R/L 12/29/23 with HHD R/L 01/12/24 w/ HHD  Hip flexion        Hip abduction (modified sitting)        Hip internal rotation        Hip external rotation        Knee flexion 5 3+ (mild pinch, no overt pain) 5     Knee extension 5 5 4- (pinch) 25.1 / 11.2 32.4 / 18.5 (transient pain)  30.3 / 19.9 mild tension L knee  Ankle dorsiflexion         (Blank rows = not tested) (Key: WFL = within functional limits not formally assessed, * = concordant pain, s = stiffness/stretching sensation, NT = not tested)  Comments: with L knee ext MMT - minimal PT resistance applied, gradual build  LOWER EXTREMITY SPECIAL TESTS:  SLR without lag or pain, 10 reps equal BIL  FUNCTIONAL TESTS:  5xSTS: 16.26sec no UE support, mild popping but no pain  12/14/23: 5xSTS 19.72 sec no UE support, some tension but no pain, mild difficulty w/ forward weight shift and shifting ; second bout 14.03sec no difference in symptoms   01/12/24: 5xSTS 10.97sec no UE support, "tension" in knee but no pain   GAIT: Distance walked: within clinic Assistive device utilized: None Level of assistance: Complete Independence Comments: mildly reduced stance time LLE, occasional hyperextension L knee in stance   FGA on eval:  -Item 1 Gait Level Surface: mild impairment 2  -Item 2 Change in Gait Speed: mild impairment 2  -Item 3 Gait with Horizontal Head Turns: mild impairment 2  -Item 4 Gait with Vertical Head Turns: Normal 3  -Item  5 Gait with Pivot Turn: Normal 3  -Item 6 Step Over Obstacle: mild impairment 2  -Item 7 Gait with Narrow Base of Support: Normal 3  -Item 8 Gait with Eyes Closed: mild impairment 2             -Item 9 Ambulating Backwards: mild impairment 2  -Item 10 Steps: moderate impairment 1  (pt self report) Total: 22 /30  * Score of <=22/30 indicates that patient is at increased risk for falls.    FGA 12/14/23:  -Item 1 Gait Level Surface: mild impairment 2  -Item 2 Change in Gait Speed: Normal 3  -Item 3 Gait with Horizontal Head Turns: Normal 3  -Item 4 Gait with Vertical Head Turns: Normal 3  -Item 5 Gait with Pivot Turn: Normal 3  -Item 6 Step Over Obstacle: Normal 3 -Item 7 Gait with Narrow Base of Support: Normal 3 -Item 8 Gait with Eyes Closed: Normal 3             -Item 9 Ambulating Backwards: Normal 3  -Item 10 Steps: moderate impairment 1 Total: 27 /30  * Score of <=22/30 indicates that patient is at increased risk for falls.                                                                                                                                 TREATMENT DATE:  [facility] Adult PT Treatment:                                                DATE: 01/12/24  Therapeutic Activity: 5xSTS + education  MSK assessment + education Stair assessment (20 stairs total) varying patterns, + education on control, appropriate compensatory strategies Education/discussion re: progress with PT, symptom behavior as it affects activity tolerance, PT goals/POC, communication w/ provider, strategies to progress/regress activity/exercise as indicated based on symptom behavior, discharge education   PATIENT EDUCATION:  Education details: rationale for interventions, HEP, PT goals/POC, discharge education Person educated: Patient Education method: Explanation, Demonstration, Tactile cues, Verbal cues Education comprehension: verbalized understanding, returned demonstration, verbal cues required, tactile  cues required, and needs further education     HOME EXERCISE PROGRAM: Access Code: [email or web address] URL: [email or web address] Date: 12/29/2023 Prepared by: Fransisco Hertz  Program Notes - with quad set, please do with gentle push into wall, knee around 60 deg as done in clinic  Exercises - Active Straight Leg Raise with Quad Set  - 2-3 x daily - 1 sets - 12 reps - Mini Squat with Counter Support  - 2-3 x daily - 1 sets - 5-8 reps - Supine Short Arc Quad  - 2-3 x daily - 1 sets - 6-8 reps - Seated Quad Set  - 2-3 x daily - 1 sets - 5 reps - Seated Knee Extension AAROM  - 2-3 x daily - 1 sets - 8-10 reps  ASSESSMENT:  CLINICAL IMPRESSION: 01/12/2024 Pt arrives w/o pain, continues to endorse steady progress. Looking forward to physician visit tomorrow where he states they will likely take imaging to assess hardware integrity. Clinically, he has progressed quite well and endorses notable improvements in mobility/balance, still having some difficulty with descending stairs. 5xSTS time is much improved, no longer indicative of fall risk. Quad symmetry has improved when measured via handheld dynamometry, although painless asymmetry does persist. He also demonstrates notable improvement in painless knee AROM arc of movement, was previously having painful catching with eccentric portion. In discussion w/ pt, mutual decision is made to discharge from PT to independent HEP for now and follow up with physician to discuss next steps. Politely declines HEP practice in clinic today, pt states he feels confident managing current program going forward.  Pt departs today's session in no acute distress, all voiced questions/concerns addressed appropriately from PT perspective.     Per eval - Patient is a pleasant 63 y.o. gentleman who was seen today for physical therapy evaluation and treatment for balance/gait in context of L patellar fracture w/ ORIF March 2024. Pt denies formal restrictions at this  point but states he may require additional surgery for non-union, goal of PT is to maximize quad strength and functional mobility. He states at present he notes the most difficulty with stair navigation, incline/declines, uneven surfaces, and sit<>stand after prolonged sitting. Doesn't have much pain but does have occasional swelling and popping. On exam he demonstrates significant reduction in L quad strength although does have good ROM. 5xSTS is within fall risk category (12-14sec cutoff score). FGA score is at cutoff for fall risk (22/30), demonstrates mild instability with dynamic tasks but no overt LOB. At present recommend skilled PT to address relevant impairments with focus on quad stability and functional mobility. Pt tolerates exam/HEP well without adverse event or increase in resting pain. Pt departs today's session in no acute distress, all voiced questions/concerns addressed appropriately from PT perspective.    OBJECTIVE IMPAIRMENTS: Abnormal gait, decreased activity tolerance, decreased balance, decreased endurance, difficulty walking, decreased strength, improper body mechanics, and pain.   ACTIVITY LIMITATIONS: carrying, lifting, squatting, stairs, transfers, and locomotion level  PARTICIPATION LIMITATIONS: meal prep, cleaning, laundry, and community activity  PERSONAL FACTORS: Age, Time since onset of injury/illness/exacerbation, and 3+ comorbidities: HTN, DM, patellar fracture L knee  are also affecting patient's functional outcome.   REHAB POTENTIAL: Fair given chronicity, surgical history, and comorbidities  CLINICAL DECISION MAKING: Stable/uncomplicated  EVALUATION COMPLEXITY: Low   GOALS:  SHORT TERM GOALS: Target date: 11/17/2023 Pt will demonstrate appropriate understanding and performance of initially prescribed HEP in order to facilitate improved independence with management of symptoms.  Baseline: HEP provided on eval 11/17/23: reports good HEP adherence Goal status:  MET   2. Pt will be able to perform 5xSTS in </= 14sec in order to facilitate reduced fall risk and improved functional mobility.  Baseline: 16sec 12/15/23: 14 sec Goal status: MET  LONG TERM GOALS: Target date: 01/12/2024 (updated 12/15/23) Pt will meet predicted score or greater on FOTO in order to demonstrate improved perception of function due to symptoms.  Baseline: FOTO TBD 12/15/23: Not set up Goal status: DEFERRED  2.  Pt will report ability to navigate up to 1 flight of stairs w/ reciprocal pattern and less than 2pt increase in resting pain in order to facilitate improved community access. Baseline: reports reciprocal pattern and rail use 12/15/23: step to descending, rail up/down 01/12/24: reciprocal ascending/descending, rail descending  Goal status: PARTIALLY MET  3.  Pt will score greater than or equal to 26/30 on Functional Gait assessment in order to indicate reduced fall risk (cutoff score </= 22/30 predictive of falls per Avelina Leitz et al 2010, MCID 4 pts Beninato et al 2014)  Baseline: 22/30 12/15/23: 27/30 Goal status: MET  4.  Pt will be able to perform 5xSTS in less than or equal to 12 in order to demonstrate reduced fall risk and improved functional independence (MCID 5xSTS = 2.3 sec). Baseline: 16sec 12/15/23: 14 sec 01/12/24: 10.97sec no UE support  Goal status: MET  5. Pt will demonstrate knee extension force output on LLE at least 70% of contralateral limb to demonstrate improved quad strength for functional activities.  Baseline: 11.2/25.1 = 45%  01/12/24: 19.9 / 30.3 = 65%   Goal status: NEARLY MET   PLAN: DISCHARGE 01/12/24  PT FREQUENCY: NA  PT DURATION: NA  PLANNED INTERVENTIONS: NA  PLAN FOR NEXT SESSION: discharge to independent HEP and follow up with provider. May benefit from additional PT going forward pending provider follow up   Lovett Ruck PT, DPT 01/12/2024 12:02 PM

## 2024-01-13 DIAGNOSIS — T148XXA Other injury of unspecified body region, initial encounter: Secondary | ICD-10-CM | POA: Diagnosis not present

## 2024-01-13 DIAGNOSIS — S82042A Displaced comminuted fracture of left patella, initial encounter for closed fracture: Secondary | ICD-10-CM | POA: Diagnosis not present

## 2024-01-13 DIAGNOSIS — S82042K Displaced comminuted fracture of left patella, subsequent encounter for closed fracture with nonunion: Secondary | ICD-10-CM | POA: Diagnosis not present

## 2024-01-29 ENCOUNTER — Other Ambulatory Visit: Payer: Self-pay | Admitting: Family Medicine

## 2024-01-29 DIAGNOSIS — I10 Essential (primary) hypertension: Secondary | ICD-10-CM

## 2024-02-03 NOTE — Therapy (Signed)
 OUTPATIENT PHYSICAL THERAPY LOWER EXTREMITY EVALUATION   Patient Name: James Ware MRN: [id number] DOB:[birth year], 63 y.o., male Today's Date: 02/04/2024  END OF SESSION:  PT End of Session - 02/04/24 0832     Visit Number 1    Number of Visits 5    Date for PT Re-Evaluation 03/31/24    Authorization Type aetna    Authorization Time Period 30VL no auth required    Authorization - Visit Number 12    Authorization - Number of Visits 30    PT Start Time [phone]    PT Stop Time 0916    PT Time Calculation (min) 44 min    Activity Tolerance Patient tolerated treatment well             Past Medical History:  Diagnosis Date   Arthritis    left knee   Eczema 05/24/2017   Essential hypertension 05/24/2017   Hyperlipidemia    Prediabetes 05/24/2017   Venous stasis dermatitis of both lower extremities 05/24/2017   Past Surgical History:  Procedure Laterality Date   COLONOSCOPY     TONSILLECTOMY     in first grade   TYMPANOSTOMY TUBE PLACEMENT     as a child   WISDOM TOOTH EXTRACTION     Patient Active Problem List   Diagnosis Date Noted   Closed patellar sleeve fracture of left knee 11/30/2022   Primary osteoarthritis of left hip 12/05/2021   Well adult exam 08/17/2021   Actinic skin damage - scalp and forehead, cryotherapy applied 02/13/21 02/13/2021   Seasonal allergic rhinitis 05/22/2019   Erectile dysfunction 04/11/2019   Hearing loss 12/16/2017   HLD (hyperlipidemia) 05/27/2017   Hypertension associated with diabetes (HCC) 05/24/2017   Eczema 05/24/2017   Venous stasis dermatitis of both lower extremities 05/24/2017   Type 2 diabetes mellitus with other specified complication (HCC) 05/24/2017    PCP: Adela Holter, DO  REFERRING PROVIDER: Correne Dillon, MD   REFERRING DIAG: S82.042A (ICD-10-CM) - Displaced comminuted fracture of left patella, initial encounter for closed fracture   THERAPY DIAG:  Left knee pain, unspecified chronicity  Other  abnormalities of gait and mobility  Rationale for Evaluation and Treatment: Rehabilitation  ONSET DATE: March 2024  SUBJECTIVE:   SUBJECTIVE STATEMENT: Pt previously seen earlier this year for bout of PT to address functional mobility and quad strength after patellar ORIF March 2024 - please refer to PT notes in EPIC from 10/20/23 to 01/12/24 for details of that episode. In time away from PT, pt reports that he had surgical follow up and imaging at that time showed further migration of his fracture site. He reports that he was told surgery remained an option, but given his continued clinical progress (improving mobility, strength, and balance), they recommended he return to PT. He describes feeling a bit of hesitancy with this episode of PT knowing that surgical integrity does seem to be worsening, but he would like to proceed as he states that he feels PT has been helpful in improving his pain, mobility, and function. He continues to deny any significant issues with swelling, mild-moderate pain at times but very transient.   PERTINENT HISTORY: HTN, DM, patellar fracture L knee  PAIN:  Are you having pain: none currently Location/description: L knee, superiorly Best-worst over past week: 0-4/10  - aggravating factors: squatting, tub transfers, prolonged walking - Easing factors: cessation of task    PRECAUTIONS: no specific restrictions, mindful of patellar fracture w/ ORIF March 2024  RED FLAGS: None  WEIGHT BEARING RESTRICTIONS: No  FALLS:  Has patient fallen in last 6 months? No  LIVING ENVIRONMENT: Lives w/ roommate/partner; one level home, 1 STE   PLOF: Independent  PATIENT GOALS: continue strengthening, increase mobility, quicker turns  NEXT MD VISIT: August for PCP and surgical team  OBJECTIVE:  Note: Objective measures were completed at Evaluation unless otherwise noted.  DIAGNOSTIC FINDINGS:  Copied from review of referring provider notes in Care Everywhere,  01/13/24: "IMAGING: All radiographs reviewed and independently interpreted by myself and Dr. Delroy Fields show: Radiographs obtained today redemonstrate comminuted patella fracture nonunion and loss of fixation with cannulated screw fixation. There is mild interval further fracture diastases from previously obtained radiographs."  PATIENT SURVEYS:  LEFS 41/68 (questions 12, 14, and 19 not answered)  COGNITION: Overall cognitive status: Within functional limits for tasks assessed     SENSATION: Intact light touch  EDEMA:  No apparent edema, pt denies issues with swelling  PALPATION: Palpable deformity of patella at surgical site, nonpainful. Mild discomfort lateral quad  LOWER EXTREMITY ROM:     Active  Right eval Left eval  Hip flexion    Hip extension    Hip internal rotation    Hip external rotation    Knee extension  Full  Knee flexion  full  (Blank rows = not tested) (Key: WFL = within functional limits not formally assessed, * = concordant pain, s = stiffness/stretching sensation, NT = not tested)  Comments:    LOWER EXTREMITY MMT:    MMT Right eval Left eval  Hip flexion    Hip abduction (modified sitting)    Hip internal rotation    Hip external rotation    Knee flexion HHD  23.4 19.4  Knee extension HHD  29.2 11.8   Ankle dorsiflexion     (Blank rows = not tested) (Key: WFL = within functional limits not formally assessed, * = concordant pain, s = stiffness/stretching sensation, NT = not tested)  Comments:      FUNCTIONAL TESTS:  Not formally assessed   GAIT: Distance walked: within clinic Assistive device utilized: None Level of assistance: Complete Independence Comments: mildly increased stance time R compared to L                                                                                                                                TREATMENT DATE:  [facility] Adult PT Treatment:                                                DATE:  02/04/24 Therapeutic Exercise: Education re: HEP, discussed discontinuing prior exercises  Self Care: Significant time w/ education/discussion re: relevant anatomy/physiology, clinical progress thus far, initiating introductory walking program, PT goals/POC, shared decision making     PATIENT EDUCATION:  Education details: Pt  education on PT impairments, prognosis, and POC. Informed consent. Rationale for interventions, safe/appropriate HEP performance Person educated: Patient Education method: Explanation, Demonstration, Tactile cues, Verbal cues Education comprehension: verbalized understanding, returned demonstration, verbal cues required, tactile cues required, and needs further education    HOME EXERCISE PROGRAM: Access Code: [email or web address] URL: [email or web address] Date: 02/04/2024 Prepared by: Mayme Spearman  Exercises - Active Straight Leg Raise with Quad Set  - 2-3 x daily - 1 sets - 15 reps - Mini Squat with Counter Support  - 2-3 x daily - 1 sets - 5-8 reps - Heel Toe Raises with Counter Support  - 2-3 x daily - 1 sets - 8-10 reps  ASSESSMENT:  CLINICAL IMPRESSION: Patient is a very pleasant 63 y.o. gentleman who was seen today for physical therapy evaluation and treatment for L patellar fracture s/p ORIF March 2024. Pt previously seen in this clinic for same issue - stated with surgical follow up he was informed there has been further displacement of fracture site, but given clinical progress he was encouraged to continue with PT for strengthening. He endorsed continued improvement overall, but reported ongoing trouble with floor/tub transfers, gardening activities, and stair navigation. On exam he continued to demonstrate good ROM although quad strength remains impaired on surgical limb. He also demonstrated pain free full arc of movement for unresisted LAQ against gravity which was improved compared to last bout of PT. Extensive time was spent today with  discussion/education as pt did voice reasonable concerns re: integrity of surgical site. Discussed at length different options with PT to allow for informed mutual decision making. At this point mutual decision was made to proceed with PT, making accommodations to allow for reduced strain on extensor mechanism and aiming to maximize functional tolerance with transfers and stair tolerance. Would also like to begin working on formalized walking program and promoting independence w/ management of exercise program. Pt verbalized agreement/understanding with this plan. Pt departed today's session in no acute distress, all voiced questions/concerns addressed appropriately from PT perspective.    OBJECTIVE IMPAIRMENTS: Abnormal gait, decreased activity tolerance, decreased endurance, decreased mobility, difficulty walking, decreased strength, impaired perceived functional ability, improper body mechanics, and pain.   ACTIVITY LIMITATIONS: bending, standing, squatting, stairs, and transfers  PARTICIPATION LIMITATIONS: community activity and yard work  PERSONAL FACTORS: Time since onset of injury/illness/exacerbation and 3+ comorbidities: HTN, DM, patellar fracture L knee are also affecting patient's functional outcome.   REHAB POTENTIAL: Good  CLINICAL DECISION MAKING: Evolving/moderate complexity  EVALUATION COMPLEXITY: Moderate   GOALS:  SHORT TERM GOALS: Target date: 03/03/2024  Pt will demonstrate appropriate understanding and performance of initially prescribed HEP in order to facilitate improved independence with management of symptoms.  Baseline: HEP established  Goal status: INITIAL   2. Pt will report at least 25% improvement in overall pain levels over past week in order to facilitate improved tolerance to typical daily activities.   Baseline: 0-4/10  Goal status: INITIAL    LONG TERM GOALS: Target date: 03/31/2024  Pt will score 50/68 or greater on LEFS in order to demonstrate improved  perception of function due to symptoms (MCID 9 pts) Baseline: 41/68  Goal status: INITIAL  2.  Pt will demonstrate at least 60% symmetry for quad/hamstring HHD measurements for improved functional strength.  Baseline: flexion 83% on L vs R; extension 40% on L vs R Goal status: INITIAL  3.  Pt will endorse independence with walking program at least 5 days/week with no increase in pain/swelling in order to  facilitate improved overall health/QOL.  Baseline: not performing formal walking program Goal status: INITIAL  4.  Pt will be able to perform floor/tub transfer w/ UE assist and no more than 1pt transient increase in pain in order to facilitate improved tolerance to household tasks. Baseline: endorses transient pain/discomfort with floor transfers, difficulty gardening, tub transfers Goal status: INITIAL    PLAN:  PT FREQUENCY: every other week  PT DURATION: 8 weeks    PLANNED INTERVENTIONS: 97164- PT Re-evaluation, 97750- Physical Performance Testing, 97110-Therapeutic exercises, 97530- Therapeutic activity, [id number]- Neuromuscular re-education, 97535- Self Care, 16109- Manual therapy, [phone]- Gait training, Patient/Family education, Balance training, Stair training, Taping, Dry Needling, Joint mobilization, Cryotherapy, and Moist heat  PLAN FOR NEXT SESSION: Review/update HEP PRN. Work on [employer] exercises as appropriate with emphasis on quad endurance while minimizing strain on extensor mechanism. Work on Dance movement psychotherapist, balance. Hip/ankle strengthening. Walking program. Symptom modification strategies as indicated/appropriate.    Lovett Ruck PT, DPT 02/04/2024 3:07 PM

## 2024-02-04 ENCOUNTER — Ambulatory Visit: Admitting: Physical Therapy

## 2024-02-04 ENCOUNTER — Encounter: Payer: Self-pay | Admitting: Physical Therapy

## 2024-02-04 ENCOUNTER — Other Ambulatory Visit: Payer: Self-pay

## 2024-02-04 DIAGNOSIS — M25562 Pain in left knee: Secondary | ICD-10-CM | POA: Insufficient documentation

## 2024-02-04 DIAGNOSIS — R2689 Other abnormalities of gait and mobility: Secondary | ICD-10-CM | POA: Insufficient documentation

## 2024-02-14 ENCOUNTER — Encounter: Payer: Self-pay | Admitting: Physical Therapy

## 2024-02-14 ENCOUNTER — Ambulatory Visit: Admitting: Physical Therapy

## 2024-02-14 DIAGNOSIS — M25562 Pain in left knee: Secondary | ICD-10-CM | POA: Diagnosis not present

## 2024-02-14 DIAGNOSIS — R2689 Other abnormalities of gait and mobility: Secondary | ICD-10-CM | POA: Diagnosis not present

## 2024-02-14 NOTE — Therapy (Signed)
 OUTPATIENT PHYSICAL THERAPY TREATMENT   Patient Name: James Ware MRN: [id number] DOB:Sep 11, 1961, 63 y.o., male Today's Date: 02/14/2024  END OF SESSION:  PT End of Session - 02/14/24 1000     Visit Number 2    Number of Visits 5    Date for PT Re-Evaluation 03/31/24    Authorization Type aetna    Authorization Time Period 30VL no auth required    Authorization - Visit Number 13    Authorization - Number of Visits 30    PT Start Time 1001    PT Stop Time 1042    PT Time Calculation (min) 41 min    Activity Tolerance Patient tolerated treatment well              Past Medical History:  Diagnosis Date   Arthritis    left knee   Eczema 05/24/2017   Essential hypertension 05/24/2017   Hyperlipidemia    Prediabetes 05/24/2017   Venous stasis dermatitis of both lower extremities 05/24/2017   Past Surgical History:  Procedure Laterality Date   COLONOSCOPY     TONSILLECTOMY     in first grade   TYMPANOSTOMY TUBE PLACEMENT     as a child   WISDOM TOOTH EXTRACTION     Patient Active Problem List   Diagnosis Date Noted   Closed patellar sleeve fracture of left knee 11/30/2022   Primary osteoarthritis of left hip 12/05/2021   Well adult exam 08/17/2021   Actinic skin damage - scalp and forehead, cryotherapy applied 02/13/21 02/13/2021   Seasonal allergic rhinitis 05/22/2019   Erectile dysfunction 04/11/2019   Hearing loss 12/16/2017   HLD (hyperlipidemia) 05/27/2017   Hypertension associated with diabetes (HCC) 05/24/2017   Eczema 05/24/2017   Venous stasis dermatitis of both lower extremities 05/24/2017   Type 2 diabetes mellitus with other specified complication (HCC) 05/24/2017    PCP: Adela Holter, DO  REFERRING PROVIDER: Correne Dillon, MD   REFERRING DIAG: S82.042A (ICD-10-CM) - Displaced comminuted fracture of left patella, initial encounter for closed fracture   THERAPY DIAG:  Left knee pain, unspecified chronicity  Other abnormalities of  gait and mobility  Rationale for Evaluation and Treatment: Rehabilitation  ONSET DATE: March 2024  SUBJECTIVE:  Per eval - Pt previously seen earlier this year for bout of PT to address functional mobility and quad strength after patellar ORIF March 2024 - please refer to PT notes in EPIC from 10/20/23 to 01/12/24 for details of that episode. In time away from PT, pt reports that he had surgical follow up and imaging at that time showed further migration of his fracture site. He reports that he was told surgery remained an option, but given his continued clinical progress (improving mobility, strength, and balance), they recommended he return to PT. He describes feeling a bit of hesitancy with this episode of PT knowing that surgical integrity does seem to be worsening, but he would like to proceed as he states that he feels PT has been helpful in improving his pain, mobility, and function. He continues to deny any significant issues with swelling, mild-moderate pain at times but very transient.   SUBJECTIVE STATEMENT: 02/14/2024 Pt states symptoms overall seem about the same. Has had some intermittent popping with squatting/transfer movements at times, nonpainful. Has been doing more gardening, floor transfers, kneeling.  PERTINENT HISTORY: HTN, DM, patellar fracture L knee  PAIN:  Are you having pain: none currently    Per eval -  Location/description: L knee, superiorly Best-worst over  past week: 0-4/10  - aggravating factors: squatting, tub transfers, prolonged walking - Easing factors: cessation of task    PRECAUTIONS: no specific restrictions, mindful of patellar fracture w/ ORIF March 2024  RED FLAGS: None   WEIGHT BEARING RESTRICTIONS: No  FALLS:  Has patient fallen in last 6 months? No  LIVING ENVIRONMENT: Lives w/ roommate/partner; one level home, 1 STE   PLOF: Independent  PATIENT GOALS: continue strengthening, increase mobility, quicker turns  NEXT MD VISIT: August  for PCP and surgical team  OBJECTIVE:  Note: Objective measures were completed at Evaluation unless otherwise noted.  DIAGNOSTIC FINDINGS:  Copied from review of referring provider notes in Care Everywhere, 01/13/24: "IMAGING: All radiographs reviewed and independently interpreted by myself and Dr. Delroy Fields show: Radiographs obtained today redemonstrate comminuted patella fracture nonunion and loss of fixation with cannulated screw fixation. There is mild interval further fracture diastases from previously obtained radiographs."  PATIENT SURVEYS:  LEFS 41/68 (questions 12, 14, and 19 not answered)  COGNITION: Overall cognitive status: Within functional limits for tasks assessed     SENSATION: Intact light touch  EDEMA:  No apparent edema, pt denies issues with swelling  PALPATION: Palpable deformity of patella at surgical site, nonpainful. Mild discomfort lateral quad  LOWER EXTREMITY ROM:     Active  Right eval Left eval  Hip flexion    Hip extension    Hip internal rotation    Hip external rotation    Knee extension  Full  Knee flexion  full  (Blank rows = not tested) (Key: WFL = within functional limits not formally assessed, * = concordant pain, s = stiffness/stretching sensation, NT = not tested)  Comments:    LOWER EXTREMITY MMT:    MMT Right eval Left eval  Hip flexion    Hip abduction (modified sitting)    Hip internal rotation    Hip external rotation    Knee flexion HHD  23.4 19.4  Knee extension HHD  29.2 11.8   Ankle dorsiflexion     (Blank rows = not tested) (Key: WFL = within functional limits not formally assessed, * = concordant pain, s = stiffness/stretching sensation, NT = not tested)  Comments:      FUNCTIONAL TESTS:  Not formally assessed   GAIT: Distance walked: within clinic Assistive device utilized: None Level of assistance: Complete Independence Comments: mildly increased stance time R compared to L                                                                                                                                 TREATMENT DATE:  [facility] Adult PT Treatment:                                                DATE: 02/14/24 Therapeutic Exercise: Marrie Sizer  band SLR (band above knee) 2x12 BIL Sidelying hip abduction unresisted 3x8 BIL   Prone hip extension 3x8 BIL HEP update/education, inc time w/ education on set up/positioning with activities and strategies to self modify as appropriate over next couple weeks  Self Care: Continued education/discussion re: load management, relevant anatomy/physiology, strategies to shift load away from quad as relevant/appropriate    [facility] Adult PT Treatment:                                                DATE: 02/04/24 Therapeutic Exercise: Education re: HEP, discussed discontinuing prior exercises  Self Care: Significant time w/ education/discussion re: relevant anatomy/physiology, clinical progress thus far, initiating introductory walking program, PT goals/POC, shared decision making     PATIENT EDUCATION:  Education details: rationale for interventions, HEP  Person educated: Patient Education method: Explanation, Demonstration, Tactile cues, Verbal cues Education comprehension: verbalized understanding, returned demonstration, verbal cues required, tactile cues required, and needs further education     HOME EXERCISE PROGRAM: Access Code: [email or web address] URL: [email or web address] Date: 02/14/2024 Prepared by: Mayme Spearman  Program Notes - with straight leg raise, can use band looped above the knee to increase challenge. if so, please perform 8-12 repetitions   Exercises - Active Straight Leg Raise with Quad Set  - 2-3 x daily - 1 sets - 15 reps - Mini Squat with Counter Support  - 2-3 x daily - 1 sets - 5-8 reps - Heel Toe Raises with Counter Support  - 2-3 x daily - 1 sets - 8-10 reps - Sidelying Hip Abduction  - 2-3 x daily - 1 sets - 8 reps - Prone Hip  Extension  - 2-3 x daily - 1 sets - 8 reps  ASSESSMENT:  CLINICAL IMPRESSION: 02/14/2024 Pt arrives w/o pain, remains very active at home. Today we focus on expanding program to continue quad loading with emphasis on mitigating patellar strain. Pt tolerates this quite well, we also expand program to build hip endurance which pt does endorse fairly significant muscular fatigue with. No adverse events, cues as above, pt departs without pain. HEP update as above. Recommend continuing along current POC in order to address relevant deficits and improve functional tolerance. Pt departs today's session in no acute distress, all voiced questions/concerns addressed appropriately from PT perspective.    Per eval -Patient is a very pleasant 63 y.o. gentleman who was seen today for physical therapy evaluation and treatment for L patellar fracture s/p ORIF March 2024. Pt previously seen in this clinic for same issue - stated with surgical follow up he was informed there has been further displacement of fracture site, but given clinical progress he was encouraged to continue with PT for strengthening. He endorsed continued improvement overall, but reported ongoing trouble with floor/tub transfers, gardening activities, and stair navigation. On exam he continued to demonstrate good ROM although quad strength remains impaired on surgical limb. He also demonstrated pain free full arc of movement for unresisted LAQ against gravity which was improved compared to last bout of PT. Extensive time was spent today with discussion/education as pt did voice reasonable concerns re: integrity of surgical site. Discussed at length different options with PT to allow for informed mutual decision making. At this point mutual decision was made to proceed with PT, making accommodations to allow for reduced strain on extensor mechanism and aiming to  maximize functional tolerance with transfers and stair tolerance. Would also like to begin  working on formalized walking program and promoting independence w/ management of exercise program. Pt verbalized agreement/understanding with this plan. Pt departed today's session in no acute distress, all voiced questions/concerns addressed appropriately from PT perspective.    OBJECTIVE IMPAIRMENTS: Abnormal gait, decreased activity tolerance, decreased endurance, decreased mobility, difficulty walking, decreased strength, impaired perceived functional ability, improper body mechanics, and pain.   ACTIVITY LIMITATIONS: bending, standing, squatting, stairs, and transfers  PARTICIPATION LIMITATIONS: community activity and yard work  PERSONAL FACTORS: Time since onset of injury/illness/exacerbation and 3+ comorbidities: HTN, DM, patellar fracture L knee are also affecting patient's functional outcome.   REHAB POTENTIAL: Good  CLINICAL DECISION MAKING: Evolving/moderate complexity  EVALUATION COMPLEXITY: Moderate   GOALS:  SHORT TERM GOALS: Target date: 03/03/2024  Pt will demonstrate appropriate understanding and performance of initially prescribed HEP in order to facilitate improved independence with management of symptoms.  Baseline: HEP established  Goal status: INITIAL   2. Pt will report at least 25% improvement in overall pain levels over past week in order to facilitate improved tolerance to typical daily activities.   Baseline: 0-4/10  Goal status: INITIAL    LONG TERM GOALS: Target date: 03/31/2024  Pt will score 50/68 or greater on LEFS in order to demonstrate improved perception of function due to symptoms (MCID 9 pts) Baseline: 41/68  Goal status: INITIAL  2.  Pt will demonstrate at least 60% symmetry for quad/hamstring HHD measurements for improved functional strength.  Baseline: flexion 83% on L vs R; extension 40% on L vs R Goal status: INITIAL  3.  Pt will endorse independence with walking program at least 5 days/week with no increase in pain/swelling in order to  facilitate improved overall health/QOL.  Baseline: not performing formal walking program Goal status: INITIAL  4.  Pt will be able to perform floor/tub transfer w/ UE assist and no more than 1pt transient increase in pain in order to facilitate improved tolerance to household tasks. Baseline: endorses transient pain/discomfort with floor transfers, difficulty gardening, tub transfers Goal status: INITIAL    PLAN:  PT FREQUENCY: every other week  PT DURATION: 8 weeks    PLANNED INTERVENTIONS: 97164- PT Re-evaluation, 97750- Physical Performance Testing, 97110-Therapeutic exercises, 97530- Therapeutic activity, [id number]- Neuromuscular re-education, 97535- Self Care, 81191- Manual therapy, [phone]- Gait training, Patient/Family education, Balance training, Stair training, Taping, Dry Needling, Joint mobilization, Cryotherapy, and Moist heat  PLAN FOR NEXT SESSION: Review/update HEP PRN. Work on [employer] exercises as appropriate with emphasis on quad endurance while minimizing strain on extensor mechanism. Work on Dance movement psychotherapist, balance. Hip/ankle strengthening. Walking program. Symptom modification strategies as indicated/appropriate.    Lovett Ruck PT, DPT 02/14/2024 10:46 AM

## 2024-03-01 ENCOUNTER — Ambulatory Visit: Admitting: Physical Therapy

## 2024-03-01 ENCOUNTER — Encounter: Payer: Self-pay | Admitting: Physical Therapy

## 2024-03-01 ENCOUNTER — Encounter: Payer: Self-pay | Admitting: Family Medicine

## 2024-03-01 DIAGNOSIS — R2689 Other abnormalities of gait and mobility: Secondary | ICD-10-CM | POA: Insufficient documentation

## 2024-03-01 DIAGNOSIS — S82002D Unspecified fracture of left patella, subsequent encounter for closed fracture with routine healing: Secondary | ICD-10-CM

## 2024-03-01 DIAGNOSIS — M25562 Pain in left knee: Secondary | ICD-10-CM | POA: Insufficient documentation

## 2024-03-01 NOTE — Therapy (Signed)
 OUTPATIENT PHYSICAL THERAPY TREATMENT   Patient Name: James Ware MRN: [id number] DOB:April 12, 1961, 63 y.o., male Today's Date: 03/01/2024  END OF SESSION:  PT End of Session - 03/01/24 0915     Visit Number 3    Number of Visits 5    Date for PT Re-Evaluation 03/31/24    Authorization Type aetna    Authorization Time Period 30VL no auth required    Authorization - Visit Number 14    Authorization - Number of Visits 30    PT Start Time [phone]    PT Stop Time 1000    PT Time Calculation (min) 44 min    Activity Tolerance Patient tolerated treatment well               Past Medical History:  Diagnosis Date   Arthritis    left knee   Eczema 05/24/2017   Essential hypertension 05/24/2017   Hyperlipidemia    Prediabetes 05/24/2017   Venous stasis dermatitis of both lower extremities 05/24/2017   Past Surgical History:  Procedure Laterality Date   COLONOSCOPY     TONSILLECTOMY     in first grade   TYMPANOSTOMY TUBE PLACEMENT     as a child   WISDOM TOOTH EXTRACTION     Patient Active Problem List   Diagnosis Date Noted   Closed patellar sleeve fracture of left knee 11/30/2022   Primary osteoarthritis of left hip 12/05/2021   Well adult exam 08/17/2021   Actinic skin damage - scalp and forehead, cryotherapy applied 02/13/21 02/13/2021   Seasonal allergic rhinitis 05/22/2019   Erectile dysfunction 04/11/2019   Hearing loss 12/16/2017   HLD (hyperlipidemia) 05/27/2017   Hypertension associated with diabetes (HCC) 05/24/2017   Eczema 05/24/2017   Venous stasis dermatitis of both lower extremities 05/24/2017   Type 2 diabetes mellitus with other specified complication (HCC) 05/24/2017    PCP: Adela Holter, DO  REFERRING PROVIDER: Correne Dillon, MD   REFERRING DIAG: S82.042A (ICD-10-CM) - Displaced comminuted fracture of left patella, initial encounter for closed fracture   THERAPY DIAG:  Left knee pain, unspecified chronicity  Other abnormalities of  gait and mobility  Rationale for Evaluation and Treatment: Rehabilitation  ONSET DATE: March 2024  SUBJECTIVE:  Per eval - Pt previously seen earlier this year for bout of PT to address functional mobility and quad strength after patellar ORIF March 2024 - please refer to PT notes in [facility] from 10/20/23 to 01/12/24 for details of that episode. In time away from PT, pt reports that he had surgical follow up and imaging at that time showed further migration of his fracture site. He reports that he was told surgery remained an option, but given his continued clinical progress (improving mobility, strength, and balance), they recommended he return to PT. He describes feeling a bit of hesitancy with this episode of PT knowing that surgical integrity does seem to be worsening, but he would like to proceed as he states that he feels PT has been helpful in improving his pain, mobility, and function. He continues to deny any significant issues with swelling, mild-moderate pain at times but very transient.   SUBJECTIVE STATEMENT: 03/01/2024 states things seem to be about the same. Finds hip exercises challenging. Pain-wise still doing okay, remains aware of some "heaviness"to the knee when trying to straighten it. Notes he has been more active around the house and in garden.    PERTINENT HISTORY: HTN, DM, patellar fracture L knee  PAIN:  Are you having  pain: none currently   Worst in past week: 3/10 (updated 03/01/24)  Per eval -  Location/description: L knee, superiorly Best-worst over past week: 0-4/10  - aggravating factors: squatting, tub transfers, prolonged walking - Easing factors: cessation of task    PRECAUTIONS: no specific restrictions, mindful of patellar fracture w/ ORIF March 2024  RED FLAGS: None   WEIGHT BEARING RESTRICTIONS: No  FALLS:  Has patient fallen in last 6 months? No  LIVING ENVIRONMENT: Lives w/ roommate/partner; one level home, 1 STE   PLOF: Independent  PATIENT  GOALS: continue strengthening, increase mobility, quicker turns  NEXT MD VISIT: August for PCP and surgical team  OBJECTIVE:  Note: Objective measures were completed at Evaluation unless otherwise noted.  DIAGNOSTIC FINDINGS:  Copied from review of referring provider notes in Care Everywhere, 01/13/24: "IMAGING: All radiographs reviewed and independently interpreted by myself and Dr. Delroy Fields show: Radiographs obtained today redemonstrate comminuted patella fracture nonunion and loss of fixation with cannulated screw fixation. There is mild interval further fracture diastases from previously obtained radiographs."  PATIENT SURVEYS:  LEFS 41/68 (questions 12, 14, and 19 not answered)  COGNITION: Overall cognitive status: Within functional limits for tasks assessed     SENSATION: Intact light touch  EDEMA:  No apparent edema, pt denies issues with swelling  PALPATION: Palpable deformity of patella at surgical site, nonpainful. Mild discomfort lateral quad  LOWER EXTREMITY ROM:     Active  Right eval Left eval  Hip flexion    Hip extension    Hip internal rotation    Hip external rotation    Knee extension  Full  Knee flexion  full  (Blank rows = not tested) (Key: WFL = within functional limits not formally assessed, * = concordant pain, s = stiffness/stretching sensation, NT = not tested)  Comments:    LOWER EXTREMITY MMT:    MMT Right eval Left eval R/L 03/01/24  Hip flexion     Hip abduction (modified sitting)     Hip internal rotation     Hip external rotation     Knee flexion HHD  23.4 19.4 23.9 / 19.6  Knee extension HHD  29.2 11.8  31.5 / 12.7  Ankle dorsiflexion      (Blank rows = not tested) (Key: WFL = within functional limits not formally assessed, * = concordant pain, s = stiffness/stretching sensation, NT = not tested)  Comments:      FUNCTIONAL TESTS:  Not formally assessed   GAIT: Distance walked: within clinic Assistive device utilized:  None Level of assistance: Complete Independence Comments: mildly increased stance time R compared to L                                                                                                                                TREATMENT DATE:  [facility] Adult PT Treatment:  DATE: 03/01/24 Therapeutic Exercise: Significant time w/ HEP discussion/education, update, handout provided; discussion re: strategies to progress/regress as indicated until next visit  Neuromuscular re-ed: LLE SLS 3x30sec w/ weaning counter support SL hip circles 3x5 CW/CCW  BIL  Self Care: HHD + education, significant time/discussion w/ education on symptom behavior, relevant anatomy/physiology and rationale for interventions, activity modification, symptom monitoring    [facility] Adult PT Treatment:                                                DATE: 02/14/24 Therapeutic Exercise: Green band SLR (band above knee) 2x12 BIL Sidelying hip abduction unresisted 3x8 BIL   Prone hip extension 3x8 BIL HEP update/education, inc time w/ education on set up/positioning with activities and strategies to self modify as appropriate over next couple weeks  Self Care: Continued education/discussion re: load management, relevant anatomy/physiology, strategies to shift load away from quad as relevant/appropriate    PATIENT EDUCATION:  Education details: rationale for interventions, HEP  Person educated: Patient Education method: Explanation, Demonstration, Tactile cues, Verbal cues Education comprehension: verbalized understanding, returned demonstration, verbal cues required, tactile cues required, and needs further education     HOME EXERCISE PROGRAM: Access Code: [email or web address] URL: [email or web address] Date: 03/01/2024 Prepared by: Mayme Spearman  Program Notes - with straight leg raise, can use band looped above the knee to increase challenge. if so, please perform 8-12  repetitions   Exercises - Active Straight Leg Raise with Quad Set  - 2-3 x daily - 1 sets - 15 reps - Mini Squat with Counter Support  - 2-3 x daily - 1 sets - 5-8 reps - Heel Toe Raises with Counter Support  - 2-3 x daily - 1 sets - 8-10 reps - Prone Hip Extension  - 2-3 x daily - 1 sets - 8-12 reps - Sidelying Hip Circles  - 2-3 x daily - 1 sets - 5-8 reps - Standing Single Leg Stance with Counter Support  - 2-3 x daily - 1 sets - 2-3 reps - 20-30sec hold  ASSESSMENT:  CLINICAL IMPRESSION: 03/01/2024 Pt arrives w/o pain, no significant changes. Today focusing on expansion of program to include SLS w/ focus on quad/HS co contraction which he does well with. Also progressing hip activation training, continues to endorse fatigue but tolerates well. Continuing to spend significant time w/ education/discussion re: rationale for interventions, relevant anatomy/physiology, and strategies to progress/regress activities/exercise appropriately to maximize pt self efficacy with management of condition. No adverse events, tolerates session well. If continues along current trajectory, could consider assessing POC in next couple visits as pt is progressing well from functional perspective but will continue to hold off on direct open chain quad strengthening out of concern for surgical integrity. Pt departs today's session in no acute distress, all voiced questions/concerns addressed appropriately from PT perspective.     Per eval -Patient is a very pleasant 63 y.o. gentleman who was seen today for physical therapy evaluation and treatment for L patellar fracture s/p ORIF March 2024. Pt previously seen in this clinic for same issue - stated with surgical follow up he was informed there has been further displacement of fracture site, but given clinical progress he was encouraged to continue with PT for strengthening. He endorsed continued improvement overall, but reported ongoing trouble with floor/tub transfers,  gardening activities, and stair navigation. On exam he  continued to demonstrate good ROM although quad strength remains impaired on surgical limb. He also demonstrated pain free full arc of movement for unresisted LAQ against gravity which was improved compared to last bout of PT. Extensive time was spent today with discussion/education as pt did voice reasonable concerns re: integrity of surgical site. Discussed at length different options with PT to allow for informed mutual decision making. At this point mutual decision was made to proceed with PT, making accommodations to allow for reduced strain on extensor mechanism and aiming to maximize functional tolerance with transfers and stair tolerance. Would also like to begin working on formalized walking program and promoting independence w/ management of exercise program. Pt verbalized agreement/understanding with this plan. Pt departed today's session in no acute distress, all voiced questions/concerns addressed appropriately from PT perspective.    OBJECTIVE IMPAIRMENTS: Abnormal gait, decreased activity tolerance, decreased endurance, decreased mobility, difficulty walking, decreased strength, impaired perceived functional ability, improper body mechanics, and pain.   ACTIVITY LIMITATIONS: bending, standing, squatting, stairs, and transfers  PARTICIPATION LIMITATIONS: community activity and yard work  PERSONAL FACTORS: Time since onset of injury/illness/exacerbation and 3+ comorbidities: HTN, DM, patellar fracture L knee are also affecting patient's functional outcome.   REHAB POTENTIAL: Good  CLINICAL DECISION MAKING: Evolving/moderate complexity  EVALUATION COMPLEXITY: Moderate   GOALS:  SHORT TERM GOALS: Target date: 03/03/2024  Pt will demonstrate appropriate understanding and performance of initially prescribed HEP in order to facilitate improved independence with management of symptoms.  Baseline: HEP established  03/01/24: reports good  HEP performance Goal status: MET  2. Pt will report at least 25% improvement in overall pain levels over past week in order to facilitate improved tolerance to typical daily activities.   Baseline: 0-4/10  03/01/24: 0-3/10  Goal status: MET  LONG TERM GOALS: Target date: 03/31/2024  Pt will score 50/68 or greater on LEFS in order to demonstrate improved perception of function due to symptoms (MCID 9 pts) Baseline: 41/68  Goal status: INITIAL  2.  Pt will demonstrate at least 60% symmetry for quad/hamstring HHD measurements for improved functional strength.  Baseline: flexion 83% on L vs R; extension 40% on L vs R Goal status: INITIAL  3.  Pt will endorse independence with walking program at least 5 days/week with no increase in pain/swelling in order to facilitate improved overall health/QOL.  Baseline: not performing formal walking program Goal status: INITIAL  4.  Pt will be able to perform floor/tub transfer w/ UE assist and no more than 1pt transient increase in pain in order to facilitate improved tolerance to household tasks. Baseline: endorses transient pain/discomfort with floor transfers, difficulty gardening, tub transfers Goal status: INITIAL    PLAN:  PT FREQUENCY: every other week  PT DURATION: 8 weeks    PLANNED INTERVENTIONS: 97164- PT Re-evaluation, 97750- Physical Performance Testing, 97110-Therapeutic exercises, 97530- Therapeutic activity, [id number]- Neuromuscular re-education, 97535- Self Care, 16109- Manual therapy, [phone]- Gait training, Patient/Family education, Balance training, Stair training, Taping, Dry Needling, Joint mobilization, Cryotherapy, and Moist heat  PLAN FOR NEXT SESSION: Review/update HEP PRN. Work on [employer] exercises as appropriate with emphasis on quad endurance while minimizing strain on extensor mechanism. Work on Dance movement psychotherapist, balance. Hip/ankle strengthening. Walking program. Symptom modification strategies as  indicated/appropriate.    Lovett Ruck PT, DPT 03/01/2024 11:17 AM

## 2024-03-01 NOTE — Telephone Encounter (Signed)
 Printed attachments and placed in your results folder

## 2024-03-14 NOTE — Therapy (Signed)
 OUTPATIENT PHYSICAL THERAPY TREATMENT + DISCHARGE    Patient Name: James Ware MRN: [id number] DOB:16-Jan-1961, 63 y.o., male Today's Date: 03/15/2024   PHYSICAL THERAPY DISCHARGE SUMMARY  Visits from Start of Care: 4  Current functional level related to goals / functional outcomes: Able to perform majority of usual activities without pain/limitations; has not yet returned to higher level recreational tasks   Remaining deficits: weakness   Education / Equipment: HEP, discharge education, follow up with provider   Patient agrees to discharge. Patient goals were not met. Patient is being discharged due to maximized rehab potential.    END OF SESSION:  PT End of Session - 03/15/24 0915     Visit Number 4    Number of Visits 5    Date for PT Re-Evaluation 03/31/24    Authorization Type aetna    Authorization Time Period 30VL no auth required    Authorization - Visit Number 15    Authorization - Number of Visits 30    PT Start Time 0915    PT Stop Time 0958    PT Time Calculation (min) 43 min    Activity Tolerance Patient tolerated treatment well          Past Medical History:  Diagnosis Date   Arthritis    left knee   Eczema 05/24/2017   Essential hypertension 05/24/2017   Hyperlipidemia    Prediabetes 05/24/2017   Venous stasis dermatitis of both lower extremities 05/24/2017   Past Surgical History:  Procedure Laterality Date   COLONOSCOPY     TONSILLECTOMY     in first grade   TYMPANOSTOMY TUBE PLACEMENT     as a child   WISDOM TOOTH EXTRACTION     Patient Active Problem List   Diagnosis Date Noted   Closed patellar sleeve fracture of left knee 11/30/2022   Primary osteoarthritis of left hip 12/05/2021   Well adult exam 08/17/2021   Actinic skin damage - scalp and forehead, cryotherapy applied 02/13/21 02/13/2021   Seasonal allergic rhinitis 05/22/2019   Erectile dysfunction 04/11/2019   Hearing loss 12/16/2017   HLD (hyperlipidemia) 05/27/2017    Hypertension associated with diabetes (HCC) 05/24/2017   Eczema 05/24/2017   Venous stasis dermatitis of both lower extremities 05/24/2017   Type 2 diabetes mellitus with other specified complication (HCC) 05/24/2017    PCP: Adela Holter, DO  REFERRING PROVIDER: Correne Dillon, MD   REFERRING DIAG: S82.042A (ICD-10-CM) - Displaced comminuted fracture of left patella, initial encounter for closed fracture   THERAPY DIAG:  Left knee pain, unspecified chronicity  Other abnormalities of gait and mobility  Rationale for Evaluation and Treatment: Rehabilitation  ONSET DATE: March 2024  SUBJECTIVE:  Per eval - Pt previously seen earlier this year for bout of PT to address functional mobility and quad strength after patellar ORIF March 2024 - please refer to PT notes in EPIC from 10/20/23 to 01/12/24 for details of that episode. In time away from PT, pt reports that he had surgical follow up and imaging at that time showed further migration of his fracture site. He reports that he was told surgery remained an option, but given his continued clinical progress (improving mobility, strength, and balance), they recommended he return to PT. He describes feeling a bit of hesitancy with this episode of PT knowing that surgical integrity does seem to be worsening, but he would like to proceed as he states that he feels PT has been helpful in improving his pain, mobility, and function.  He continues to deny any significant issues with swelling, mild-moderate pain at times but very transient.   SUBJECTIVE STATEMENT: 03/15/2024: pt states he has been doing well. Still feels some heaviness in his leg with extension, feels like it may be more noticeable. No increases in pain, popping, or swelling. He does report a couple instances of near buckling.    PERTINENT HISTORY: HTN, DM, patellar fracture L knee  PAIN:  Are you having pain: none currently   Worst in past week: no pain in past two weeks  (updated 03/15/24)   Per eval -  Location/description: L knee, superiorly Best-worst over past week: 0-4/10  - aggravating factors: squatting, tub transfers, prolonged walking - Easing factors: cessation of task    PRECAUTIONS: no specific restrictions, mindful of patellar fracture w/ ORIF March 2024  RED FLAGS: None   WEIGHT BEARING RESTRICTIONS: No  FALLS:  Has patient fallen in last 6 months? No  LIVING ENVIRONMENT: Lives w/ roommate/partner; one level home, 1 STE   PLOF: Independent  PATIENT GOALS: continue strengthening, increase mobility, quicker turns  NEXT MD VISIT: August for PCP and surgical team  OBJECTIVE:  Note: Objective measures were completed at Evaluation unless otherwise noted.  DIAGNOSTIC FINDINGS:  Copied from review of referring provider notes in Care Everywhere, 01/13/24: IMAGING: All radiographs reviewed and independently interpreted by myself and Dr. Delroy Fields show: Radiographs obtained today redemonstrate comminuted patella fracture nonunion and loss of fixation with cannulated screw fixation. There is mild interval further fracture diastases from previously obtained radiographs.  PATIENT SURVEYS:  LEFS 41/68 (questions 12, 14, and 19 not answered)  LEFS 03/15/24 50/68   COGNITION: Overall cognitive status: Within functional limits for tasks assessed     SENSATION: Intact light touch  EDEMA:  No apparent edema, pt denies issues with swelling  PALPATION: Palpable deformity of patella at surgical site, nonpainful. Mild discomfort lateral quad  LOWER EXTREMITY ROM:     Active  Right eval Left eval  Hip flexion    Hip extension    Hip internal rotation    Hip external rotation    Knee extension  Full  Knee flexion  full  (Blank rows = not tested) (Key: WFL = within functional limits not formally assessed, * = concordant pain, s = stiffness/stretching sensation, NT = not tested)  Comments:    LOWER EXTREMITY MMT:    MMT Right eval  Left eval R/L 03/01/24 R/L 03/15/24    Hip flexion      Hip abduction (modified sitting)      Hip internal rotation      Hip external rotation      Knee flexion HHD  23.4 19.4 23.9 / 19.6 30.7 / 23.3  Knee extension HHD  29.2 11.8  31.5 / 12.7 29.1/ 10.4  Ankle dorsiflexion       (Blank rows = not tested) (Key: WFL = within functional limits not formally assessed, * = concordant pain, s = stiffness/stretching sensation, NT = not tested)  Comments:      FUNCTIONAL TESTS:  Not formally assessed   GAIT: Distance walked: within clinic Assistive device utilized: None Level of assistance: Complete Independence Comments: mildly increased stance time R compared to L  TREATMENT DATE:  [facility] Adult PT Treatment:                                                DATE: 03/15/24 Therapeutic Exercise: Time w/ education/discussion re: HEP management, appropriate progression/regression based on symptom response and effort, handout provided  Therapeutic Activity: MSK assessment + education Education/discussion re: progress with PT, symptom behavior as it affects activity tolerance, PT goals/POC, symptom progression, principles of activity modification and strategies to mitigate knee extensor load, communication w/ providers   PATIENT EDUCATION:  Education details: PT POC, PT goals, progress with PT thus far, discharge planning, HEP, follow up with provider Person educated: Patient Education method: Explanation, Demonstration, Verbal cues Education comprehension: verbalized understanding, returned demonstration   HOME EXERCISE PROGRAM: Access Code: [email or web address] URL: [email or web address] Date: 03/15/2024 Prepared by: Mayme Spearman  Program Notes - with straight leg raise, can use band looped above the knee to increase challenge.   Exercises - Active  Straight Leg Raise with Quad Set  - 2-3 x daily - 1 sets - 8-15 reps - Heel Toe Raises with Counter Support  - 2-3 x daily - 1 sets - 15-20 reps - Prone Hip Extension  - 2-3 x daily - 1 sets - 8-15 reps - Sidelying Hip Circles  - 2-3 x daily - 1 sets - 8-15 reps - Standing Single Leg Stance with Counter Support  - 2-3 x daily - 1 sets - 2-3 reps - 30-60sec hold  ASSESSMENT:  CLINICAL IMPRESSION: 03/15/2024: Pt arrives w/ report of no pain over past couple of weeks but does endorse a couple instances of near buckling, still having some popping. At this point has progressed well with his exercise program overall, but even with activity/exercise modification he continues to have progression of symptoms. Strength via HHD measurements remain comparable to initial evaluation. Continuing to spend significant time w/ education/discussion as above. Mutual decision is made with pt today to discharge to independent HEP and follow up with provider given limited progress with PT at this point. Pt verbalizes agreement/understanding with discharge plan at this time, no adverse events. Pt departs today's session in no acute distress, all voiced questions/concerns addressed appropriately from PT perspective.    Per eval -Patient is a very pleasant 63 y.o. gentleman who was seen today for physical therapy evaluation and treatment for L patellar fracture s/p ORIF March 2024. Pt previously seen in this clinic for same issue - stated with surgical follow up he was informed there has been further displacement of fracture site, but given clinical progress he was encouraged to continue with PT for strengthening. He endorsed continued improvement overall, but reported ongoing trouble with floor/tub transfers, gardening activities, and stair navigation. On exam he continued to demonstrate good ROM although quad strength remains impaired on surgical limb. He also demonstrated pain free full arc of movement for unresisted LAQ against  gravity which was improved compared to last bout of PT. Extensive time was spent today with discussion/education as pt did voice reasonable concerns re: integrity of surgical site. Discussed at length different options with PT to allow for informed mutual decision making. At this point mutual decision was made to proceed with PT, making accommodations to allow for reduced strain on extensor mechanism and aiming to maximize functional tolerance with transfers and stair tolerance. Would also like to begin working on formalized  walking program and promoting independence w/ management of exercise program. Pt verbalized agreement/understanding with this plan. Pt departed today's session in no acute distress, all voiced questions/concerns addressed appropriately from PT perspective.    OBJECTIVE IMPAIRMENTS: Abnormal gait, decreased activity tolerance, decreased endurance, decreased mobility, difficulty walking, decreased strength, impaired perceived functional ability, improper body mechanics, and pain.   ACTIVITY LIMITATIONS: bending, standing, squatting, stairs, and transfers  PARTICIPATION LIMITATIONS: community activity and yard work  PERSONAL FACTORS: Time since onset of injury/illness/exacerbation and 3+ comorbidities: HTN, DM, patellar fracture L knee are also affecting patient's functional outcome.   REHAB POTENTIAL: Good  CLINICAL DECISION MAKING: Evolving/moderate complexity  EVALUATION COMPLEXITY: Moderate   GOALS:  SHORT TERM GOALS: Target date: 03/03/2024  Pt will demonstrate appropriate understanding and performance of initially prescribed HEP in order to facilitate improved independence with management of symptoms.  Baseline: HEP established  03/01/24: reports good HEP performance Goal status: MET  2. Pt will report at least 25% improvement in overall pain levels over past week in order to facilitate improved tolerance to typical daily activities.   Baseline: 0-4/10  03/01/24:  0-3/10  Goal status: MET  LONG TERM GOALS: Target date: 03/31/2024  Pt will score 50/68 or greater on LEFS in order to demonstrate improved perception of function due to symptoms (MCID 9 pts) Baseline: 41/68  03/15/24: 50/68 Goal status: MET   2.  Pt will demonstrate at least 60% symmetry for quad/hamstring HHD measurements for improved functional strength.  Baseline: flexion 83% on L vs R; extension 40% on L vs R 03/15/24: 76% flexion, 36% extension Goal status: NOT MET  3.  Pt will endorse independence with walking program at least 5 days/week with no increase in pain/swelling in order to facilitate improved overall health/QOL.  Baseline: not performing formal walking program 03/15/24: reports walking ~3 days a week on average; limited more so by scheduling than knee Goal status: NOT MET  4.  Pt will be able to perform floor/tub transfer w/ UE assist and no more than 1pt transient increase in pain in order to facilitate improved tolerance to household tasks. Baseline: endorses transient pain/discomfort with floor transfers, difficulty gardening, tub transfers 03/15/24: deferred given reported increase in popping and instability w/ transfers/walking Goal status: NOT MET   PLAN: DISCHARGE 03/15/24    Lovett Ruck PT, DPT 03/15/2024 1:44 PM

## 2024-03-15 ENCOUNTER — Ambulatory Visit: Admitting: Physical Therapy

## 2024-03-15 ENCOUNTER — Encounter: Payer: Self-pay | Admitting: Physical Therapy

## 2024-03-15 DIAGNOSIS — M25562 Pain in left knee: Secondary | ICD-10-CM

## 2024-03-15 DIAGNOSIS — R2689 Other abnormalities of gait and mobility: Secondary | ICD-10-CM | POA: Diagnosis not present

## 2024-03-21 DIAGNOSIS — M25562 Pain in left knee: Secondary | ICD-10-CM | POA: Diagnosis not present

## 2024-03-27 DIAGNOSIS — H52202 Unspecified astigmatism, left eye: Secondary | ICD-10-CM | POA: Diagnosis not present

## 2024-03-27 DIAGNOSIS — H5202 Hypermetropia, left eye: Secondary | ICD-10-CM | POA: Diagnosis not present

## 2024-03-27 DIAGNOSIS — H35073 Retinal telangiectasis, bilateral: Secondary | ICD-10-CM | POA: Diagnosis not present

## 2024-03-27 DIAGNOSIS — H524 Presbyopia: Secondary | ICD-10-CM | POA: Diagnosis not present

## 2024-03-27 DIAGNOSIS — H2513 Age-related nuclear cataract, bilateral: Secondary | ICD-10-CM | POA: Diagnosis not present

## 2024-03-27 DIAGNOSIS — H35053 Retinal neovascularization, unspecified, bilateral: Secondary | ICD-10-CM | POA: Diagnosis not present

## 2024-04-12 ENCOUNTER — Other Ambulatory Visit: Payer: Self-pay | Admitting: Orthopedic Surgery

## 2024-04-12 DIAGNOSIS — M25562 Pain in left knee: Secondary | ICD-10-CM

## 2024-04-18 ENCOUNTER — Ambulatory Visit
Admission: RE | Admit: 2024-04-18 | Discharge: 2024-04-18 | Disposition: A | Discharge status: Home / Self Care | Source: Ambulatory Visit | Attending: Orthopedic Surgery | Admitting: Orthopedic Surgery

## 2024-04-18 DIAGNOSIS — M25562 Pain in left knee: Secondary | ICD-10-CM

## 2024-04-28 ENCOUNTER — Other Ambulatory Visit: Payer: Self-pay | Admitting: Family Medicine

## 2024-04-28 DIAGNOSIS — I10 Essential (primary) hypertension: Secondary | ICD-10-CM

## 2024-05-25 ENCOUNTER — Ambulatory Visit: Payer: 59 | Admitting: Family Medicine

## 2024-05-30 ENCOUNTER — Other Ambulatory Visit: Payer: Self-pay | Admitting: Family Medicine

## 2024-05-30 ENCOUNTER — Encounter: Payer: Self-pay | Admitting: Sports Medicine

## 2024-05-30 DIAGNOSIS — I10 Essential (primary) hypertension: Secondary | ICD-10-CM

## 2024-06-21 ENCOUNTER — Other Ambulatory Visit: Payer: Self-pay

## 2024-06-21 DIAGNOSIS — E1169 Type 2 diabetes mellitus with other specified complication: Secondary | ICD-10-CM

## 2024-06-22 ENCOUNTER — Other Ambulatory Visit: Payer: Self-pay | Admitting: Family Medicine

## 2024-06-22 DIAGNOSIS — I10 Essential (primary) hypertension: Secondary | ICD-10-CM

## 2024-06-22 LAB — HEMOGLOBIN A1C
Est. average glucose Bld gHb Est-mCnc: 169 mg/dL
Hgb A1c MFr Bld: 7.5 % — ABNORMAL HIGH (ref 4.8–5.6)

## 2024-06-22 LAB — MICROALBUMIN / CREATININE URINE RATIO
Creatinine, Urine: 136.1 mg/dL
Microalb/Creat Ratio: 4 mg/g{creat} (ref 0–29)
Microalbumin, Urine: 4.9 ug/mL

## 2024-06-23 ENCOUNTER — Encounter: Payer: Self-pay | Admitting: Family Medicine

## 2024-06-23 ENCOUNTER — Ambulatory Visit (INDEPENDENT_AMBULATORY_CARE_PROVIDER_SITE_OTHER): Admitting: Family Medicine

## 2024-06-23 VITALS — BP 130/77 | HR 80 | Ht 75.0 in | Wt 276.3 lb

## 2024-06-23 DIAGNOSIS — E1159 Type 2 diabetes mellitus with other circulatory complications: Secondary | ICD-10-CM

## 2024-06-23 DIAGNOSIS — E785 Hyperlipidemia, unspecified: Secondary | ICD-10-CM | POA: Diagnosis not present

## 2024-06-23 DIAGNOSIS — I152 Hypertension secondary to endocrine disorders: Secondary | ICD-10-CM | POA: Diagnosis not present

## 2024-06-23 DIAGNOSIS — E1169 Type 2 diabetes mellitus with other specified complication: Secondary | ICD-10-CM

## 2024-06-23 NOTE — Assessment & Plan Note (Signed)
Continue rosuvastatin at current strength.

## 2024-06-23 NOTE — Assessment & Plan Note (Signed)
Blood pressure is well-controlled.  Continue lisinopril at current strength. 

## 2024-06-23 NOTE — Assessment & Plan Note (Signed)
 A1c is up some.  He is working on increasing activity since surgery.   Encouraged continued dietary changes.

## 2024-06-23 NOTE — Progress Notes (Signed)
 James Ware - 63 y.o. male MRN [id number]  Date of birth: [birth year]  Subjective Chief Complaint  Patient presents with   Diabetes   Hypertension    HPI James Ware is a 63 y.o. male here today for follow up visit.   He reports that he is doing well.  He recently had revision of knee  He is doing well since having this completed.   He continue son hydrochlorothiazide  for management of HTN.  BP is well controlled.  He denies chest pain, headache , shortness of breath, or vision changes.   Tolerating crestor  well at current strength.    A1c has increased since last visit.  He has not been as active since his last surgery. Denies new symptoms.   ROS:  A comprehensive ROS was completed and negative except as noted per HPI   No Known Allergies  Past Medical History:  Diagnosis Date   Arthritis    left knee   Eczema 05/24/2017   Essential hypertension 05/24/2017   Hyperlipidemia    Prediabetes 05/24/2017   Venous stasis dermatitis of both lower extremities 05/24/2017    Past Surgical History:  Procedure Laterality Date   COLONOSCOPY     TONSILLECTOMY     in first grade   TYMPANOSTOMY TUBE PLACEMENT     as a child   WISDOM TOOTH EXTRACTION      Social History   Socioeconomic History   Marital status: Divorced    Spouse name: Not on file   Number of children: Not on file   Years of education: Not on file   Highest education level: Professional school degree (e.g., MD, DDS, DVM, JD)  Occupational History   Not on file  Tobacco Use   Smoking status: Former   Smokeless tobacco: Never  Vaping Use   Vaping status: Never Used  Substance and Sexual Activity   Alcohol use: Yes    Comment: 1-2 drinks per week   Drug use: Never   Sexual activity: Not on file  Other Topics Concern   Not on file  Social History Narrative   Not on file   Social Drivers of Health   Financial Resource Strain: Low Risk  (06/16/2024)   Overall Financial Resource Strain (CARDIA)     Difficulty of Paying Living Expenses: Not very hard  Food Insecurity: No Food Insecurity (06/16/2024)   Hunger Vital Sign    Worried About Running Out of Food in the Last Year: Never true    Ran Out of Food in the Last Year: Never true  Transportation Needs: No Transportation Needs (06/16/2024)   PRAPARE - Administrator, Civil Service (Medical): No    Lack of Transportation (Non-Medical): No  Physical Activity: Insufficiently Active (06/16/2024)   Exercise Vital Sign    Days of Exercise per Week: 3 days    Minutes of Exercise per Session: 30 min  Stress: No Stress Concern Present (06/16/2024)   [employer] of Occupational Health - Occupational Stress Questionnaire    Feeling of Stress: Not at all  Social Connections: Moderately Integrated (06/16/2024)   Social Connection and Isolation Panel    Frequency of Communication with Friends and Family: Once a week    Frequency of Social Gatherings with Friends and Family: Once a week    Attends Religious Services: More than 4 times per year    Active Member of [employer] or Organizations: Yes    Attends Banker Meetings: More than 4 times per year  Marital Status: Living with partner    Family History  Problem Relation Age of Onset   Colon cancer Mother 24   Hyperlipidemia Mother    Cancer Father        bladder   Esophageal cancer Neg Hx    Rectal cancer Neg Hx    Stomach cancer Neg Hx     Health Maintenance  Topic Date Due   Medicare Annual Wellness (AWV)  Never done   HIV Screening  Never done   COVID-19 Vaccine (8 - Pfizer risk 2024-25 season) 05/29/2024   OPHTHALMOLOGY EXAM  09/30/2024   Diabetic kidney evaluation - eGFR measurement  11/24/2024   HEMOGLOBIN A1C  12/19/2024   Diabetic kidney evaluation - Urine ACR  06/21/2025   FOOT EXAM  06/23/2025   Colonoscopy  09/12/2027   DTaP/Tdap/Td (3 - Td or Tdap) 10/31/2030   Pneumococcal Vaccine: 50+ Years  Completed   Influenza Vaccine  Completed    Hepatitis C Screening  Completed   Zoster Vaccines- Shingrix  Completed   Hepatitis B Vaccines 19-59 Average Risk  Aged Out   HPV VACCINES  Aged Out   Meningococcal B Vaccine  Aged Out     ----------------------------------------------------------------------------------------------------------------------------------------------------------------------------------------------------------------- Physical Exam BP 130/77 (BP Location: Left Arm, Patient Position: Sitting, Cuff Size: Large)   Pulse 80   Ht 6' 3 (1.905 m)   Wt 276 lb 4.8 oz (125.3 kg)   SpO2 99%   BMI 34.54 kg/m   Physical Exam Constitutional:      Appearance: Normal appearance.  Cardiovascular:     Rate and Rhythm: Normal rate and regular rhythm.  Pulmonary:     Effort: Pulmonary effort is normal.     Breath sounds: Normal breath sounds.  Neurological:     General: No focal deficit present.     Mental Status: He is alert.  Psychiatric:        Mood and Affect: Mood normal.        Behavior: Behavior normal.     ------------------------------------------------------------------------------------------------------------------------------------------------------------------------------------------------------------------- Assessment and Plan  Hypertension associated with diabetes (HCC) Blood pressure is well-controlled.  Continue lisinopril at current strength.  Type 2 diabetes mellitus with other specified complication (HCC) A1c is up some.  He is working on increasing activity since surgery.   Encouraged continued dietary changes.  HLD (hyperlipidemia) Continue rosuvastatin  at current strength.   No orders of the defined types were placed in this encounter.   Return in about 4 months (around 10/23/2024) for Hypertension, Type 2 Diabetes.

## 2024-08-25 ENCOUNTER — Other Ambulatory Visit: Payer: Self-pay | Admitting: Family Medicine

## 2024-08-25 DIAGNOSIS — E785 Hyperlipidemia, unspecified: Secondary | ICD-10-CM

## 2024-10-04 ENCOUNTER — Encounter: Payer: Self-pay | Admitting: Family Medicine

## 2024-10-04 MED ORDER — TADALAFIL 20 MG PO TABS: ORAL_TABLET | ORAL | 3 refills | Status: AC

## 2024-10-18 ENCOUNTER — Encounter: Payer: Self-pay | Admitting: Family Medicine

## 2024-10-27 ENCOUNTER — Ambulatory Visit: Admitting: Family Medicine

## 2024-11-22 ENCOUNTER — Ambulatory Visit: Admitting: Family Medicine
